# Patient Record
Sex: Female | Born: 1965
Health system: Southern US, Community
[De-identification: ages and names within clinical notes are randomized; demographics above are authoritative.]

## PROBLEM LIST (undated history)

## (undated) DIAGNOSIS — E039 Hypothyroidism, unspecified: Secondary | ICD-10-CM

## (undated) DIAGNOSIS — I83893 Varicose veins of bilateral lower extremities with other complications: Secondary | ICD-10-CM

## (undated) DIAGNOSIS — R011 Cardiac murmur, unspecified: Secondary | ICD-10-CM

## (undated) HISTORY — DX: Cardiac murmur, unspecified: R01.1

## (undated) HISTORY — PX: TUBAL LIGATION: SHX77

## (undated) HISTORY — DX: Varicose veins of bilateral lower extremities with other complications: I83.893

## (undated) HISTORY — DX: Hypothyroidism, unspecified: E03.9

---

## 1998-11-03 ENCOUNTER — Other Ambulatory Visit: Admission: RE | Admit: 1998-11-03 | Discharge: 1998-11-03 | Payer: Self-pay | Admitting: Obstetrics and Gynecology

## 1998-12-20 ENCOUNTER — Encounter: Payer: Self-pay | Admitting: Obstetrics and Gynecology

## 1998-12-20 ENCOUNTER — Ambulatory Visit (HOSPITAL_COMMUNITY): Admission: RE | Admit: 1998-12-20 | Discharge: 1998-12-20 | Payer: Self-pay | Admitting: Obstetrics and Gynecology

## 1999-01-10 ENCOUNTER — Inpatient Hospital Stay (HOSPITAL_COMMUNITY): Admission: AD | Admit: 1999-01-10 | Discharge: 1999-01-13 | Payer: Self-pay | Admitting: Obstetrics and Gynecology

## 1999-01-11 ENCOUNTER — Encounter: Payer: Self-pay | Admitting: Obstetrics and Gynecology

## 1999-02-18 ENCOUNTER — Inpatient Hospital Stay (HOSPITAL_COMMUNITY): Admission: AD | Admit: 1999-02-18 | Discharge: 1999-02-18 | Payer: Self-pay | Admitting: Obstetrics and Gynecology

## 1999-02-18 ENCOUNTER — Encounter: Payer: Self-pay | Admitting: Obstetrics and Gynecology

## 1999-04-26 ENCOUNTER — Inpatient Hospital Stay (HOSPITAL_COMMUNITY): Admission: AD | Admit: 1999-04-26 | Discharge: 1999-04-26 | Payer: Self-pay | Admitting: Obstetrics and Gynecology

## 1999-05-15 ENCOUNTER — Encounter (INDEPENDENT_AMBULATORY_CARE_PROVIDER_SITE_OTHER): Payer: Self-pay | Admitting: Specialist

## 1999-05-15 ENCOUNTER — Inpatient Hospital Stay (HOSPITAL_COMMUNITY): Admission: AD | Admit: 1999-05-15 | Discharge: 1999-05-17 | Payer: Self-pay | Admitting: Obstetrics and Gynecology

## 1999-09-24 ENCOUNTER — Emergency Department (HOSPITAL_COMMUNITY): Admission: EM | Admit: 1999-09-24 | Discharge: 1999-09-24 | Payer: Self-pay | Admitting: Emergency Medicine

## 2004-06-28 ENCOUNTER — Emergency Department (HOSPITAL_COMMUNITY): Admission: EM | Admit: 2004-06-28 | Discharge: 2004-06-28 | Payer: Self-pay | Admitting: Emergency Medicine

## 2005-07-16 ENCOUNTER — Other Ambulatory Visit: Admission: RE | Admit: 2005-07-16 | Discharge: 2005-07-16 | Payer: Self-pay | Admitting: Obstetrics and Gynecology

## 2010-01-17 ENCOUNTER — Emergency Department (HOSPITAL_COMMUNITY)
Admission: EM | Admit: 2010-01-17 | Discharge: 2010-01-17 | Payer: Self-pay | Source: Home / Self Care | Admitting: Emergency Medicine

## 2010-01-18 ENCOUNTER — Emergency Department (HOSPITAL_COMMUNITY)
Admission: EM | Admit: 2010-01-18 | Discharge: 2010-01-18 | Payer: Self-pay | Source: Home / Self Care | Admitting: Emergency Medicine

## 2010-04-07 ENCOUNTER — Emergency Department (HOSPITAL_COMMUNITY)
Admission: EM | Admit: 2010-04-07 | Discharge: 2010-04-07 | Disposition: A | Discharge status: Home / Self Care | Payer: Self-pay | Attending: Emergency Medicine | Admitting: Emergency Medicine

## 2010-04-07 DIAGNOSIS — J3489 Other specified disorders of nose and nasal sinuses: Secondary | ICD-10-CM | POA: Insufficient documentation

## 2010-04-07 DIAGNOSIS — R300 Dysuria: Secondary | ICD-10-CM | POA: Insufficient documentation

## 2010-04-07 DIAGNOSIS — J329 Chronic sinusitis, unspecified: Secondary | ICD-10-CM | POA: Insufficient documentation

## 2010-04-07 LAB — URINALYSIS, ROUTINE W REFLEX MICROSCOPIC
Bilirubin Urine: NEGATIVE
Hgb urine dipstick: NEGATIVE
Ketones, ur: NEGATIVE mg/dL
Nitrite: NEGATIVE
Protein, ur: NEGATIVE mg/dL
Specific Gravity, Urine: 1.019 (ref 1.005–1.030)
Urine Glucose, Fasting: NEGATIVE mg/dL
Urobilinogen, UA: 1 mg/dL (ref 0.0–1.0)
pH: 8 (ref 5.0–8.0)

## 2010-04-07 LAB — POCT PREGNANCY, URINE: Preg Test, Ur: NEGATIVE

## 2010-04-09 LAB — URINE CULTURE: Culture  Setup Time: 201202250140

## 2010-06-30 NOTE — Discharge Summary (Signed)
[facility]  Patient:    Madison Walker, Madison Walker                        MRN: [id number] Adm. Date:  [id number] Disc. Date: [id number] Attending:  Michaele Offer                           Discharge Summary  DISCHARGE DIAGNOSES:          1. Term pregnancy at 40+ weeks, delivered.                               2. Normal spontaneous vaginal delivery.                               3. Postpartum tubal ligation.  DISCHARGE MEDICATIONS:        1. Percocet 1-2 tablets p.o. every four to six hours                                  p.r.n.                               2. Motrin 600 mg p.o. every six hours.                               3. Prenatal vitamins 1 p.o. daily.                               4. Macrobid 1 tablet p.o. nightly for suppression of                                  urinary tract infections.  FOLLOW-UP:                    The patient is to follow up in our office in approximately six weeks for her routine postpartum exam.  HOSPITAL COURSE:              The patient is a 45 year old G2, P1-0-0-1, who was admitted at 40-5/7 weeks for induction given past her due date and a favorable cervix.  On admission she had good fetal movement, no leakage of fluid, no vaginal bleeding, and some occasional uterine contractions.  Her pregnancy had been complicated by pyelonephritis treated with IV antibiotics, and then the patient was placed on suppression with Macrobid given frequent UTIs.  The patient also with  advanced maternal age; however, declined an amniocentesis.  PRENATAL LABORATORIES:        O positive, antibody negative, RPR negative, rubella immune.  Hepatitis B surface antigen negative.  HIV negative.  GC negative, chlamydia negative, and GBS negative.  PAST OBSTETRICAL HISTORY:     In 1997, she had a normal spontaneous vaginal delivery, an 8-pound 13-ounce infant.  PAST GYNECOLOGIC HISTORY:     None.  PAST MEDICAL HISTORY:          History of multiple UTIs, as previously stated, with no urological workup at this date.  PAST SURGICAL HISTORY:  None.  ALLERGIES:                    No known drug allergies.  MEDICATIONS:                  Macrodantin and prenatal vitamins.  HOSPITAL COURSE:              On admission she was afebrile, with stable vital signs.  Her cervix was 1+, 50% effaced, and the head was slightly ballottable, ith a small part palpable above the head.  This was confirmed by sonogram to be a foot, but the infant was vertex, in a piked position.  Given the high station, assisted rupture of membranes was not performed at that time, and the patient was begun n high-dose Pitocin.  She did progress, with cervix becoming 2 cm dilated, and the vertex becoming well applied to the cervix.  At that point she had artificial rupture of membranes with clear fluid returned and at that point progressed rapidly to complete, with a normal spontaneous vaginal delivery of an 8-pound 3-ounce infant, Apgars of 9 and 9.  She had a small second-degree perineal laceration which was repaired.  Placenta was delivered spontaneously, and EBL was approximately 50. Following delivery, the patient was counseled regarding the risks and benefits f a tubal ligation including risk of failure of 1-2 in 300.  She also was counseled  regarding an increased risk of ectopic should pregnancy occur.  She accepted these risks, and desired to proceed with permanent sterilization.  Therefore, on postpartum day #1, she underwent a postpartum tubal ligation without complication. She was continued in-house, and on postpartum day #2 was doing well.  Her t-max was 99.4, and was normal on discharge.  She had normal lochia, and her pain was controlled.  Her incision was well approximated with no erythema noted and, therefore, she was discharged to home with medications and follow-up as previously stated. DD:  05/17/99 TD:   05/19/99 Job: [id number]

## 2010-06-30 NOTE — Op Note (Signed)
[facility]  Patient:    Madison Walker, Madison Walker                        MRN: [id number] Proc. Date: 05/16/99 Adm. Date:  [id number] Attending:  Michaele Offer                           Operative Report  PREOPERATIVE DIAGNOSIS:       Multiparity, desires sterility.  POSTOPERATIVE DIAGNOSIS:      Multiparity, desires sterility.  OPERATION:                    Postpartum bilateral tubal ligation with Pomeroy method.  SURGEON:                      Alvino Chapel, M.D.  ASSISTANT:  ANESTHESIA:                   Epidural.  ESTIMATED BLOOD LOSS:         50 cc.  IV FLUIDS:                    600 cc LR.  PATHOLOGY:                    Right and left fallopian tube segments were sent.  FINDINGS:                     The patient had normal ovaries and tubes bilaterally as well as normal uterus postpartum.  DESCRIPTION OF PROCEDURE:     The patient was taken to the operating room after  informed consent was obtained.  She was counseled as to the risks of failure of  tubal ligation of approximately 2 in 300 as well as increased risk of ectopic pregnancy should pregnancy occur.  She agreed to proceed with a permanent sterilization procedure.  In the operating room, epidural anesthesia was found o be adequate by Allis clamp test.  Intraumbilical incision was then made with additional 1% Marcaine and a local block.  This incision was approximately 2 cm in width.  This was carried down to the underlying layer of fascia with sharp dissection and the fascia was opened sharply.  The peritoneal cavity was entered and the Army-Nav[employer] retractor placed within the incision.  Some omentum and small  bowel were packed away with a laparotomy sponge which was left draped outside the abdomen.  The patients right fallopian tube was then identified, grasped with a  Babcock, and traced out to the fimbriated end.  A small loop of this tube was elevated out of the  incision and two free ties of 0 plain were passed around a cm knuckle of tube.  The tube was then amputated with Metzenbaum scissors and the pedicle inspected and found to be hemostatic.  The tube pedicle was then additionally cauterized with Bovie cautery, again reinspected and found to be hemostatic.  On the patients left in a similar fashion, the fallopian tube was identified, grasped with a Babcock, and traced out to its fimbriated end.  A 3 m segment of tube was elevated, tied off with two free ties of 0 plain, and amputated with Metzenbaum scissors.  Again the free pedicle was cauterized with Bovie cautery. This was found to be hemostatic and returned to the abdomen.  The laparotomy sponges were removed and the  fascia was then closed with 0 Vicryl in a running fashion.  The skin was closed with 3-0 Vicryl in a running fashion. Countx 2 and the patient was taken to the recovery room in stable condition. DD:  05/16/99 TD:  05/16/99 Job: [id number]

## 2010-07-19 ENCOUNTER — Inpatient Hospital Stay (HOSPITAL_COMMUNITY)
Admission: AD | Admit: 2010-07-19 | Discharge: 2010-07-19 | Disposition: A | Discharge status: Home / Self Care | Payer: Self-pay | Source: Ambulatory Visit | Attending: Obstetrics & Gynecology | Admitting: Obstetrics & Gynecology

## 2010-07-19 DIAGNOSIS — N949 Unspecified condition associated with female genital organs and menstrual cycle: Secondary | ICD-10-CM | POA: Insufficient documentation

## 2010-07-19 DIAGNOSIS — N938 Other specified abnormal uterine and vaginal bleeding: Secondary | ICD-10-CM | POA: Insufficient documentation

## 2010-07-19 LAB — CBC
MCH: 32.5 pg (ref 26.0–34.0)
MCV: 98.9 fL (ref 78.0–100.0)
Platelets: 225 10*3/uL (ref 150–400)
RBC: 3.69 MIL/uL — ABNORMAL LOW (ref 3.87–5.11)
RDW: 12.6 % (ref 11.5–15.5)
WBC: 6.7 10*3/uL (ref 4.0–10.5)

## 2010-07-19 LAB — URINALYSIS, ROUTINE W REFLEX MICROSCOPIC
Glucose, UA: NEGATIVE mg/dL
Hgb urine dipstick: NEGATIVE
Ketones, ur: NEGATIVE mg/dL
Protein, ur: NEGATIVE mg/dL
pH: 6 (ref 5.0–8.0)

## 2010-07-19 LAB — WET PREP, GENITAL
Trich, Wet Prep: NONE SEEN
Yeast Wet Prep HPF POC: NONE SEEN

## 2010-07-19 LAB — URINE MICROSCOPIC-ADD ON

## 2010-07-20 LAB — GC/CHLAMYDIA PROBE AMP, GENITAL
Chlamydia, DNA Probe: NEGATIVE
GC Probe Amp, Genital: NEGATIVE

## 2010-08-17 ENCOUNTER — Encounter: Payer: Self-pay | Admitting: Obstetrics and Gynecology

## 2013-09-26 ENCOUNTER — Emergency Department (HOSPITAL_COMMUNITY)
Admission: EM | Admit: 2013-09-26 | Discharge: 2013-09-26 | Disposition: A | Discharge status: Home / Self Care | Payer: Self-pay | Attending: Emergency Medicine | Admitting: Emergency Medicine

## 2013-09-26 ENCOUNTER — Emergency Department (HOSPITAL_COMMUNITY): Payer: Self-pay

## 2013-09-26 DIAGNOSIS — N390 Urinary tract infection, site not specified: Secondary | ICD-10-CM | POA: Insufficient documentation

## 2013-09-26 DIAGNOSIS — M545 Low back pain, unspecified: Secondary | ICD-10-CM | POA: Insufficient documentation

## 2013-09-26 DIAGNOSIS — Z79899 Other long term (current) drug therapy: Secondary | ICD-10-CM | POA: Insufficient documentation

## 2013-09-26 DIAGNOSIS — Z3202 Encounter for pregnancy test, result negative: Secondary | ICD-10-CM | POA: Insufficient documentation

## 2013-09-26 DIAGNOSIS — R110 Nausea: Secondary | ICD-10-CM | POA: Insufficient documentation

## 2013-09-26 DIAGNOSIS — R197 Diarrhea, unspecified: Secondary | ICD-10-CM | POA: Insufficient documentation

## 2013-09-26 LAB — COMPREHENSIVE METABOLIC PANEL
ALT: 14 U/L (ref 0–35)
ANION GAP: 12 (ref 5–15)
AST: 18 U/L (ref 0–37)
Albumin: 4.1 g/dL (ref 3.5–5.2)
Alkaline Phosphatase: 100 U/L (ref 39–117)
BILIRUBIN TOTAL: 0.5 mg/dL (ref 0.3–1.2)
BUN: 14 mg/dL (ref 6–23)
CHLORIDE: 104 meq/L (ref 96–112)
CO2: 25 meq/L (ref 19–32)
CREATININE: 0.68 mg/dL (ref 0.50–1.10)
Calcium: 9.8 mg/dL (ref 8.4–10.5)
GFR calc Af Amer: >90 mL/min (ref >90)
GLUCOSE: 88 mg/dL (ref 70–99)
Potassium: 4.5 mEq/L (ref 3.7–5.3)
Sodium: 141 mEq/L (ref 137–147)
Total Protein: 8.1 g/dL (ref 6.0–8.3)

## 2013-09-26 LAB — URINALYSIS, ROUTINE W REFLEX MICROSCOPIC
BILIRUBIN URINE: NEGATIVE
Glucose, UA: NEGATIVE mg/dL
Hgb urine dipstick: NEGATIVE
KETONES UR: NEGATIVE mg/dL
NITRITE: NEGATIVE
PH: 6 (ref 5.0–8.0)
Protein, ur: NEGATIVE mg/dL
Specific Gravity, Urine: 1.024 (ref 1.005–1.030)
UROBILINOGEN UA: 0.2 mg/dL (ref 0.0–1.0)

## 2013-09-26 LAB — URINE MICROSCOPIC-ADD ON

## 2013-09-26 LAB — CBC WITH DIFFERENTIAL/PLATELET
Basophils Absolute: 0 10*3/uL (ref 0.0–0.1)
Basophils Relative: 0 % (ref 0–1)
Eosinophils Absolute: 0.1 10*3/uL (ref 0.0–0.7)
Eosinophils Relative: 1 % (ref 0–5)
HEMATOCRIT: 37.8 % (ref 36.0–46.0)
HEMOGLOBIN: 12.7 g/dL (ref 12.0–15.0)
LYMPHS ABS: 1.1 10*3/uL (ref 0.7–4.0)
LYMPHS PCT: 18 % (ref 12–46)
MCH: 32.6 pg (ref 26.0–34.0)
MCHC: 33.6 g/dL (ref 30.0–36.0)
MCV: 96.9 fL (ref 78.0–100.0)
MONO ABS: 0.7 10*3/uL (ref 0.1–1.0)
MONOS PCT: 11 % (ref 3–12)
NEUTROS ABS: 4.2 10*3/uL (ref 1.7–7.7)
Neutrophils Relative %: 70 % (ref 43–77)
Platelets: 194 10*3/uL (ref 150–400)
RBC: 3.9 MIL/uL (ref 3.87–5.11)
RDW: 12.8 % (ref 11.5–15.5)
WBC: 5.9 10*3/uL (ref 4.0–10.5)

## 2013-09-26 LAB — POC URINE PREG, ED: Preg Test, Ur: NEGATIVE

## 2013-09-26 LAB — LIPASE, BLOOD: LIPASE: 31 U/L (ref 11–59)

## 2013-09-26 MED ORDER — OXYCODONE-ACETAMINOPHEN 5-325 MG PO TABS
1.0000 | ORAL_TABLET | Freq: Once | ORAL | Status: AC
  Administered 2013-09-26: 1 via ORAL
  Filled 2013-09-26: qty 1

## 2013-09-26 MED ORDER — DEXTROSE 5 % IV SOLN
1.0000 g | Freq: Once | INTRAVENOUS | Status: AC
  Administered 2013-09-26: 1 g via INTRAVENOUS
  Filled 2013-09-26: qty 10

## 2013-09-26 MED ORDER — SODIUM CHLORIDE 0.9 % IV BOLUS (SEPSIS)
1000.0000 mL | Freq: Once | INTRAVENOUS | Status: AC
  Administered 2013-09-26: 1000 mL via INTRAVENOUS

## 2013-09-26 MED ORDER — NITROFURANTOIN MONOHYD MACRO 100 MG PO CAPS: 100.0000 mg | ORAL_CAPSULE | Freq: Two times a day (BID) | ORAL | Status: DC

## 2013-09-26 NOTE — ED Notes (Signed)
Pt reports that she has had a bladder infection before and states pain in back in middle over spine.  She reports not incontinent, nor does she feel like she has trouble emptying her bladder fully at each urination.  Pt sts she dose have sence of urgency though.  sts subjective fevers and body aches as well.

## 2013-09-26 NOTE — ED Notes (Signed)
Patient transported to X-ray 

## 2013-09-26 NOTE — Discharge Instructions (Signed)
Please call and set-up an appointment with [facility] Please call and set-up an appointment with [facility] Please take antibiotics as prescribed Please drink plenty of water and stay hydrated  Please continue to monitor symptoms closely and if symptoms are to worsen or change (fever greater than 101, chills, sweating, nausea, vomiting, chest pain, shortness of breath, difficulty breathing, weakness, numbness, tingling, worsening or changes to pain pattern) please report back to the ED immediately  Urinary Tract Infection Urinary tract infections (UTIs) can develop anywhere along your urinary tract. Your urinary tract is your body's drainage system for removing wastes and extra water. Your urinary tract includes two kidneys, two ureters, a bladder, and a urethra. Your kidneys are a pair of bean-shaped organs. Each kidney is about the size of your fist. They are located below your ribs, one on each side of your spine. CAUSES Infections are caused by microbes, which are microscopic organisms, including fungi, viruses, and bacteria. These organisms are so small that they can only be seen through a microscope. Bacteria are the microbes that most commonly cause UTIs. SYMPTOMS  Symptoms of UTIs may vary by age and gender of the patient and by the location of the infection. Symptoms in young women typically include a frequent and intense urge to urinate and a painful, burning feeling in the bladder or urethra during urination. Older women and men are more likely to be tired, shaky, and weak and have muscle aches and abdominal pain. A fever may mean the infection is in your kidneys. Other symptoms of a kidney infection include pain in your back or sides below the ribs, nausea, and vomiting. DIAGNOSIS To diagnose a UTI, your caregiver will ask you about your symptoms. Your caregiver also will ask to provide a urine sample. The urine sample will be tested for bacteria and white  blood cells. White blood cells are made by your body to help fight infection. TREATMENT  Typically, UTIs can be treated with medication. Because most UTIs are caused by a bacterial infection, they usually can be treated with the use of antibiotics. The choice of antibiotic and length of treatment depend on your symptoms and the type of bacteria causing your infection. HOME CARE INSTRUCTIONS  If you were prescribed antibiotics, take them exactly as your caregiver instructs you. Finish the medication even if you feel better after you have only taken some of the medication.  Drink enough water and fluids to keep your urine clear or pale yellow.  Avoid caffeine, tea, and carbonated beverages. They tend to irritate your bladder.  Empty your bladder often. Avoid holding urine for long periods of time.  Empty your bladder before and after sexual intercourse.  After a bowel movement, women should cleanse from front to back. Use each tissue only once. SEEK MEDICAL CARE IF:   You have back pain.  You develop a fever.  Your symptoms do not begin to resolve within 3 days. SEEK IMMEDIATE MEDICAL CARE IF:   You have severe back pain or lower abdominal pain.  You develop chills.  You have nausea or vomiting.  You have continued burning or discomfort with urination. MAKE SURE YOU:   Understand these instructions.  Will watch your condition.  Will get help right away if you are not doing well or get worse. Document Released: 11/08/2004 Document Revised: 07/31/2011 Document Reviewed: 03/09/2011 [facility] Patient Information 2015 [place], [place]. This information is not intended to replace advice given to you by your health care  provider. Make sure you discuss any questions you have with your health care provider.   Emergency Department Resource Guide 1) Find a Doctor and Pay Out of Pocket Although you won't have to find out who is covered by your insurance plan, it is a good idea to ask  around and get recommendations. You will then need to call the office and see if the doctor you have chosen will accept you as a new patient and what types of options they offer for patients who are self-pay. Some doctors offer discounts or will set up payment plans for their patients who do not have insurance, but you will need to ask so you aren't surprised when you get to your appointment.  2) Contact Your Local Health Department Not all health departments have doctors that can see patients for sick visits, but many do, so it is worth a call to see if yours does. If you don't know where your local health department is, you can check in your phone book. The CDC also has a tool to help you locate your state's health department, and many state websites also have listings of all of their local health departments.  3) Find a [facility] If your illness is not likely to be very severe or complicated, you may want to try a walk in clinic. These are popping up all over the country in pharmacies, drugstores, and shopping centers. They're usually staffed by nurse practitioners or physician assistants that have been trained to treat common illnesses and complaints. They're usually fairly quick and inexpensive. However, if you have serious medical issues or chronic medical problems, these are probably not your best option.  No Primary Care Doctor: - Call Health Connect at  [phone] - they can help you locate a primary care doctor that  accepts your insurance, provides certain services, etc. - Physician Referral Service- [phone]  Chronic Pain Problems: Organization         Address  Phone   Notes  [facility]  [phone] Patients need to be referred by their primary care doctor.   Medication Assistance: Organization         Address  Phone   Notes  [facility] Medication [facility] [address] [place], [place] [address] [phone] --Must be a  resident of [facility] -- Must have NO insurance coverage whatsoever (no Medicaid/ Medicare, etc.) -- The pt. MUST have a primary care doctor that directs their care regularly and follows them in the community   MedAssist  [phone]   [employer]  [phone]    Agencies that provide inexpensive medical care: Organization         Address  Phone   Notes  [place]  [phone]   [facility]    [phone]   [facility] [place], [place] [address] [phone]   [place] [address], [place] [phone]   Planned Parenthood    [phone]   [facility]    [phone]   [place] and [place] [address], [place] Phone:  [phone], Fax:  [phone] Hours of Operation:  9 am - 6 pm, M-F.  Also accepts Medicaid/Medicare and self-pay.  [facility] for [place], [address], [place] Phone: [phone], Fax: [phone]. Hours of Operation:  8:30 am - 5:30 pm, M-F.  Also accepts Medicaid and self-pay.  [facility] [place] [address] [place] Phone: [phone]   [place], [place], [place] [phone], Ext. [phone] Mondays & Thursdays: 7-9 AM.  First 15 patients are seen on a first come, first serve basis.    [place] Providers:  Organization         Address  Phone   Notes  [facility] [address], [place] [phone] Also accepts self-pay patients.  [facility] [address], [place]  [phone]   [place], [address], [place] [phone]   [facility] [address], [place] [phone]   Lucianne Lei [address], [place]   [phone] Only accepts  [place] Access [place] patients after they have their name applied to their card.   Self-Pay (no insurance) in [facility]:  Organization         Address  Phone   Notes  Sickle Cell Patients, [facility] Internal Medicine [place] [phone]   [facility] Urgent Care [place] [phone]   [facility] [place]  [place], [place], [place] [phone]   [facility]/Dr. Osei-Bonsu  [address] [place] or [address], [place] [phone] Phone number for both [place] and [place] locations is the same.  [facility]gent [facility] and [facility] [address] [place] [phone]   [facility] [address] [place] or [address] [phone]   [facility] [address], [place] [phone], phone; [phone], fax Sees patients 1st and 3rd Saturday of every month.  Must not qualify for public or private insurance (i.e. Medicaid, Medicare, [place] Health Choice, Veterans' Benefits)  Household income should be no more than 200% of the poverty level The clinic cannot treat you if you are pregnant or think you are pregnant  Sexually transmitted diseases are not treated at the clinic.    Dental Care: Organization         Address  Phone  Notes  [facility] Department of [facility] [place] [phone] Accepts children up to age 45 who are enrolled in [place] or [place]; pregnant women with a Medicaid card; and children who have applied for Medicaid or [place] Health Choice, but were declined, whose parents can pay a reduced fee at time of service.  [facility] Department of [facility]  [address], [place] [phone] Accepts children up to age 77 who are enrolled in [place] or [place]; pregnant women  with a Medicaid card; and children who have applied for Medicaid or [place] Health Choice, but were declined, whose parents can pay a reduced fee at time of service.  [place] Adult Dental Access PROGRAM  [place] [phone] Patients are seen by appointment only. Walk-ins are not accepted. [place] will see patients 87 years of age and older. Monday - Tuesday (8am-5pm) Most Wednesdays (8:30-5pm) $30 per visit, cash only  [facility] Adult Dental Access PROGRAM  [address], [facility] [phone] Patients are seen by appointment  only. Walk-ins are not accepted. [place] will see patients 30 years of age and older. One Wednesday Evening (Monthly: Volunteer Based).  $30 per visit, cash only  [place]  [phone] for adults; Children under age 47, call Graduate Pediatric Dentistry at [phone]. Children aged 51-14, please call [phone] to request a pediatric application.  Dental services are provided in all areas of dental care including fillings, crowns and bridges, complete and partial dentures, implants, gum treatment, root canals, and extractions. Preventive care is also provided. Treatment is provided to both adults and children. Patients are selected via a lottery and there is often a waiting list.   [facility] [address] [place]  [phone] [email or web address]   [facility] [address] [place], [place] [phone], Ext. [phone] Second and Fourth Thursday of each month, opens at 6:30 AM; Clinic ends at 9 AM.  Patients are seen on a first-come first-served basis, and a limited number are seen during each clinic.   [facility]  [address] Hillard Danker [place], [place] [phone]   Eligibility Requirements You must have lived in [place], [place], or [place] counties for at least the last three months.   You cannot be eligible for state or federal sponsored [employer], including [employer], [place], or [employer].   You generally cannot be eligible for healthcare insurance through your employer.    How to apply: Eligibility screenings are held every Tuesday and Wednesday afternoon from 1:00 pm until 4:00 pm. You do not need an appointment for the interview!  [facility] [address] [place], [place]   [place]  [place] Department  [place]  [phone]    Behavioral Health Resources in the Community: Intensive Outpatient Programs Organization         Address  Phone  Notes  [place]. [address], [place], [place] [phone]   [facility] [address] [place], [place]   ADS: Alcohol & Drug Svcs [address] [place], [place]   [place] [address]  [place], [place] or [phone]   Substance Abuse Resources Organization         Address  Phone  Notes  Alcohol and Drug Services  [phone]   [place]  [phone]   The [place]   Chinita Pester  [phone]   Residential & Outpatient Substance Abuse Program  [phone]   Psychological Services Organization         Address  Phone  Notes  [facility]  [place]  [phone]   [place] [address], [place] or [phone]    Mobile Crisis Teams Organization         Address  Phone  Notes  [facility], Mobile Crisis Care Unit  [phone]   [facility]ive Psychotherapeutic Services  [address] [place], [place]   [place] Levels [address] [place] [phone]    Self-Help/Support Groups Organization         Address  Phone             Notes  [place]. of [place] - variety of support groups  [place] Call for more information  Narcotics Anonymous (NA), [facility] [address], [employer] [place]  2 meetings at this location   Special educational needs teacher         Address  Phone  Notes  ASAP Residential Treatment [place],    [place]  [phone]   [facility]  [address], [place] [id number], [place], [place]   [place], [place] [phone] Admissions: 8am-3pm M-F  Incentives Substance [place] [address],    [place], [place] [phone]   [facility] [address] [place], [place], [place]   [facility] [address],  [place], [place]   [facility]ight Programs - Intensive Outpatient [address], [place], [place]   [facility] [place].,  [place], [place] [phone] or [phone]   [facility]idential Treatment Services (RTS) [address], [place], [place] Accepts Medicaid  [facility] [address]  [place] [phone] Substance Abuse/Addiction Treatment   [facility] Organization         Address  Phone  Notes  [facility]  [phone]   Domenic Schwab, PhD [address] Arlis Porta [place], [place]   [phone] or [phone]   [place], [place] [phone]   [facility] [address] [place], [place] [phone] Insurance/Medicaid/sponsorship through [facility] and Families [address], [address] [place]                                    [place], [place] [phone] [place] [address] [place] [address], [place] [phone]    Dr. Adele Schilder  [phone]   [facility] Dept. 1) [address]  [place] 2) [place] 3)  [place] [address], [place] [phone]  [phone]   [facility] [place] [phone] or [phone] (After Hours)

## 2013-09-26 NOTE — ED Provider Notes (Signed)
CSN: [id number]     Arrival date & time 09/26/13  1224 History   First MD Initiated Contact with Patient 09/26/13 1244     Chief Complaint  Patient presents with  . Nausea     (Consider location/radiation/quality/duration/timing/severity/associated sxs/prior Treatment) The history is provided by the patient. No language interpreter was used.  Madison Walker is a 48 y/o F with no known significant PMHx presenting to the ED with increased urine frequency, lower back pain, and generalized bodyaches that have been ongoing for the past week and a half. Patient reported that the back pain is localized to lower portion described as an aching sensation without radiation. Stated that she has intermittent generalized bodyaches off and on better controlled with Tylenol. Stated that she's been experiencing mild suprapubic discomfort-more a pressure sensation that improves with urination. Reported that she had a low-grade fever this morning approximately 99.18F. Stated that she's been feeling mildly nauseous with no episodes of emesis-report that when she eats she has intermittent episodes of diarrhea - loose brown stools with no blood or mucus identified. Stated that she's been passing gas without difficulty. Reported that her last menstrual cycle was a couple weeks ago. Patient reported that when she gets UTIs she normally presents in this manner. Denied abdominal pain, dysuria, hematuria, vomiting, headache, dizziness, blurred vision, sudden loss of vision, neck pain, neck stiffness, melena, hematochezia vaginal pain, vaginal discharge. PCP none  No past medical history on file. No past surgical history on file. No family history on file. History  Substance Use Topics  . Smoking status: Not on file  . Smokeless tobacco: Not on file  . Alcohol Use: Not on file   OB History   No data available     Review of Systems  Constitutional: Positive for fever and chills.  Respiratory: Negative for cough,  chest tightness and shortness of breath.   Cardiovascular: Negative for chest pain.  Gastrointestinal: Positive for nausea and diarrhea. Negative for vomiting, abdominal pain, constipation, blood in stool and anal bleeding.  Genitourinary: Positive for frequency. Negative for dysuria, urgency, hematuria, flank pain, decreased urine volume, vaginal bleeding, vaginal discharge, vaginal pain and pelvic pain.  Musculoskeletal: Positive for back pain and myalgias (Generalized). Negative for neck pain and neck stiffness.  Neurological: Negative for dizziness, weakness and headaches.      Allergies  Review of patient's allergies indicates no known allergies.  Home Medications   Prior to Admission medications   Medication Sig Start Date End Date Taking? Authorizing Provider  acetaminophen (TYLENOL) 500 MG tablet Take 1,500 mg by mouth every 6 (six) hours as needed for mild pain or moderate pain.   Yes Historical Provider, MD  nitrofurantoin, macrocrystal-monohydrate, (MACROBID) 100 MG capsule Take 1 capsule (100 mg total) by mouth 2 (two) times daily. 09/26/13   Neftaly Swiss, PA-C   BP 123/71  Pulse 62  Temp(Src) 99 F (37.2 C) (Oral)  Resp 17  SpO2 100%  LMP 09/12/2013 Physical Exam  Nursing note and vitals reviewed. Constitutional: She is oriented to person, place, and time. She appears well-developed and well-nourished. No distress.  Patient sitting comfortable sitting upright in bed with negative signs of distress  HENT:  Head: Normocephalic and atraumatic.  Mouth/Throat: Oropharynx is clear and moist. No oropharyngeal exudate.  Eyes: Conjunctivae and EOM are normal. Pupils are equal, round, and reactive to light. Right eye exhibits no discharge. Left eye exhibits no discharge.  Neck: Normal range of motion. Neck supple. No tracheal deviation present.  Cardiovascular: Normal rate, regular rhythm and normal heart sounds.  Exam reveals no friction rub.   No murmur heard. Cap refill  less than 3 seconds Negative swelling or pitting edema identified to the lower extremities bilaterally  Pulmonary/Chest: Effort normal and breath sounds normal. No respiratory distress. She has no wheezes. She has no rales.  Patient is able to speak in full sentences without difficulty Negative use of accessory muscles Negative stridor  Abdominal: Soft. Bowel sounds are normal. She exhibits no distension. There is tenderness. There is no rebound and no guarding.  Negative abdominal distention Bowel sounds normal active in all 4 quadrants Abdomen soft upon palpation Mild discomfort upon palpation to the suprapubic region-as per patient described as a pressure Negative peritoneal signs Negative guarding or rigidity Negative CVA tenderness bilaterally  Musculoskeletal: Normal range of motion.  Full ROM to upper and lower extremities without difficulty noted, negative ataxia noted.  Lymphadenopathy:    She has no cervical adenopathy.  Neurological: She is alert and oriented to person, place, and time. No cranial nerve deficit. She exhibits normal muscle tone. Coordination normal.  Cranial nerves III-XII grossly intact Strength 5+/5+ to upper and lower extremities bilaterally with resistance applied, equal distribution noted Equal grip strength bilaterally Negative facial droop Negative slurred speech Negative aphasia  Skin: Skin is warm and dry. No rash noted. She is not diaphoretic. No erythema.  Psychiatric: She has a normal mood and affect. Her behavior is normal. Thought content normal.    ED Course  Procedures (including critical care time)  Results for orders placed during the hospital encounter of 09/26/13  CBC WITH DIFFERENTIAL      Result Value Ref Range   WBC 5.9  4.0 - 10.5 K/uL   RBC 3.90  3.87 - 5.11 MIL/uL   Hemoglobin 12.7  12.0 - 15.0 g/dL   HCT 37.8  36.0 - 46.0 %   MCV 96.9  78.0 - 100.0 fL   MCH 32.6  26.0 - 34.0 pg   MCHC 33.6  30.0 - 36.0 g/dL   RDW 12.8   11.5 - 15.5 %   Platelets 194  150 - 400 K/uL   Neutrophils Relative % 70  43 - 77 %   Neutro Abs 4.2  1.7 - 7.7 K/uL   Lymphocytes Relative 18  12 - 46 %   Lymphs Abs 1.1  0.7 - 4.0 K/uL   Monocytes Relative 11  3 - 12 %   Monocytes Absolute 0.7  0.1 - 1.0 K/uL   Eosinophils Relative 1  0 - 5 %   Eosinophils Absolute 0.1  0.0 - 0.7 K/uL   Basophils Relative 0  0 - 1 %   Basophils Absolute 0.0  0.0 - 0.1 K/uL  COMPREHENSIVE METABOLIC PANEL      Result Value Ref Range   Sodium 141  137 - 147 mEq/L   Potassium 4.5  3.7 - 5.3 mEq/L   Chloride 104  96 - 112 mEq/L   CO2 25  19 - 32 mEq/L   Glucose, Bld 88  70 - 99 mg/dL   BUN 14  6 - 23 mg/dL   Creatinine, Ser 0.68  0.50 - 1.10 mg/dL   Calcium 9.8  8.4 - 10.5 mg/dL   Total Protein 8.1  6.0 - 8.3 g/dL   Albumin 4.1  3.5 - 5.2 g/dL   AST 18  0 - 37 U/L   ALT 14  0 - 35 U/L   Alkaline Phosphatase  100  39 - 117 U/L   Total Bilirubin 0.5  0.3 - 1.2 mg/dL   GFR calc non Af Amer >90  >90 mL/min   GFR calc Af Amer >90  >90 mL/min   Anion gap 12  5 - 15  LIPASE, BLOOD      Result Value Ref Range   Lipase 31  11 - 59 U/L  URINALYSIS, ROUTINE W REFLEX MICROSCOPIC      Result Value Ref Range   Color, Urine YELLOW  YELLOW   APPearance CLOUDY (*) CLEAR   Specific Gravity, Urine 1.024  1.005 - 1.030   pH 6.0  5.0 - 8.0   Glucose, UA NEGATIVE  NEGATIVE mg/dL   Hgb urine dipstick NEGATIVE  NEGATIVE   Bilirubin Urine NEGATIVE  NEGATIVE   Ketones, ur NEGATIVE  NEGATIVE mg/dL   Protein, ur NEGATIVE  NEGATIVE mg/dL   Urobilinogen, UA 0.2  0.0 - 1.0 mg/dL   Nitrite NEGATIVE  NEGATIVE   Leukocytes, UA MODERATE (*) NEGATIVE  URINE MICROSCOPIC-ADD ON      Result Value Ref Range   Squamous Epithelial / LPF MANY (*) RARE   WBC, UA TOO NUMEROUS TO COUNT  <3 WBC/hpf   RBC / HPF 0-2  <3 RBC/hpf   Bacteria, UA FEW (*) RARE  POC URINE PREG, ED      Result Value Ref Range   Preg Test, Ur NEGATIVE  NEGATIVE    Labs Review Labs Reviewed   URINALYSIS, ROUTINE W REFLEX MICROSCOPIC - Abnormal; Notable for the following:    APPearance CLOUDY (*)    Leukocytes, UA MODERATE (*)    All other components within normal limits  URINE MICROSCOPIC-ADD ON - Abnormal; Notable for the following:    Squamous Epithelial / LPF MANY (*)    Bacteria, UA FEW (*)    All other components within normal limits  URINE CULTURE  CBC WITH DIFFERENTIAL  COMPREHENSIVE METABOLIC PANEL  LIPASE, BLOOD  POC URINE PREG, ED    Imaging Review Dg Lumbar Spine Complete  09/26/2013   CLINICAL DATA:  Low back pain and soreness for 1-1/2 weeks.  EXAM: LUMBAR SPINE - COMPLETE 4+ VIEW  COMPARISON:  Plain films lumbar spine 01/18/2010.  FINDINGS: Vertebral body height and alignment are maintained. Mild loss of disc space height at L4-5 and L5-S1 is noted. There is also facet degenerative change at these levels.  IMPRESSION: No acute finding.  Mild appearing lower lumbar spondylosis, unchanged.   Electronically Signed   By: Inge Rise M.D.   On: 09/26/2013 15:49     EKG Interpretation None      MDM   Final diagnoses:  UTI (lower urinary tract infection)  Low back pain without sciatica, unspecified back pain laterality    Medications  sodium chloride 0.9 % bolus 1,000 mL (0 mLs Intravenous Stopped 09/26/13 1420)  cefTRIAXone (ROCEPHIN) 1 g in dextrose 5 % 50 mL IVPB (0 g Intravenous Stopped 09/26/13 1454)  oxyCODONE-acetaminophen (PERCOCET/ROXICET) 5-325 MG per tablet 1 tablet (1 tablet Oral Given 09/26/13 1548)   Filed Vitals:   09/26/13 1233 09/26/13 1256 09/26/13 1544 09/26/13 1616  BP: 131/74 123/77 123/69 123/71  Pulse: 81 75 78 62  Temp: 98.9 F (37.2 C) 99 F (37.2 C)    TempSrc: Oral Oral    Resp: 16 12 17 17   SpO2: 97% 97%  100%   CBC negative elevated white blood cell count. CMP unremarkable. Lipase negative elevation. Urine pregnancy negative. Urinalysis noted moderate  leukocytes with white blood cells too many to count with many  squamous cells and bacteria. Urine culture pending. Plain film of lumbar spine unremarkable. Suspicion to to be UTI. Cannot rule out possible pyelonephritis. Patient given IV antibiotics. Patient stable, afebrile. Patient not septic appearing. Discharged patient. Discharged patient with antibiotics. Discussed with patient to rest and stay hydrated. Referred to [facility] [place] [facility]. Discussed with patient to closely monitor symptoms and if symptoms are to worsen or change to report back to the ED - strict return instructions given.  Patient agreed to plan of care, understood, all questions answered.   Jamse Mead, PA-C 09/26/13 1640

## 2013-09-26 NOTE — ED Notes (Signed)
She c/o generalized body aches with some low back pain and urinary frequency x 1 1/2 weeks with con't. Nausea.  She denies vomiting and states she has had a few diarrhea stools.

## 2013-09-27 NOTE — ED Provider Notes (Addendum)
Medical screening examination/treatment/procedure(s) were performed by non-physician practitioner and as supervising physician I was immediately available for consultation/collaboration.   EKG Interpretation None         Delice Bison Randle Shatzer, DO 09/27/13 [place], DO 10/07/13 1943

## 2013-09-28 LAB — URINE CULTURE
COLONY COUNT: NO GROWTH
Culture: NO GROWTH
SPECIAL REQUESTS: NORMAL

## 2014-11-11 ENCOUNTER — Emergency Department (HOSPITAL_COMMUNITY)
Admission: EM | Admit: 2014-11-11 | Discharge: 2014-11-11 | Disposition: A | Discharge status: Home / Self Care | Payer: Self-pay | Attending: Emergency Medicine | Admitting: Emergency Medicine

## 2014-11-11 ENCOUNTER — Encounter (HOSPITAL_COMMUNITY): Payer: Self-pay

## 2014-11-11 DIAGNOSIS — N390 Urinary tract infection, site not specified: Secondary | ICD-10-CM | POA: Insufficient documentation

## 2014-11-11 LAB — URINALYSIS, ROUTINE W REFLEX MICROSCOPIC
Bilirubin Urine: NEGATIVE
GLUCOSE, UA: NEGATIVE mg/dL
Hgb urine dipstick: NEGATIVE
Ketones, ur: NEGATIVE mg/dL
NITRITE: NEGATIVE
PH: 5.5 (ref 5.0–8.0)
Protein, ur: NEGATIVE mg/dL
SPECIFIC GRAVITY, URINE: 1.018 (ref 1.005–1.030)
Urobilinogen, UA: 1 mg/dL (ref 0.0–1.0)

## 2014-11-11 LAB — URINE MICROSCOPIC-ADD ON

## 2014-11-11 MED ORDER — CEPHALEXIN 500 MG PO CAPS: 500.0000 mg | ORAL_CAPSULE | Freq: Four times a day (QID) | ORAL | Status: DC

## 2014-11-11 MED ORDER — NITROFURANTOIN MONOHYD MACRO 100 MG PO CAPS: 100.0000 mg | ORAL_CAPSULE | Freq: Two times a day (BID) | ORAL | Status: DC

## 2014-11-11 NOTE — ED Notes (Addendum)
Pt c/o lower back pain, dysuria, and nausea x 6 days and lower abdominal cramping x 2 days.  Pain score 5/10.  Pt reports taking acetaminophen w/o relief.

## 2014-11-11 NOTE — Discharge Instructions (Signed)

## 2014-11-11 NOTE — ED Notes (Signed)
md at bedside  Pt alert and oriented x4. Respirations even and unlabored, bilateral symmetrical rise and fall of chest. Skin warm and dry. In no acute distress. Denies needs.   

## 2014-11-11 NOTE — ED Provider Notes (Signed)
CSN: [id number]     Arrival date & time 11/11/14  0705 History   First MD Initiated Contact with Patient 11/11/14 [phone]     Chief Complaint  Patient presents with  . Back Pain  . Dysuria  . Nausea     (Consider location/radiation/quality/duration/timing/severity/associated sxs/prior Treatment) HPI  Madison Walker is a 49 y.o. female who presents for evaluation of dysuria, nausea, lower back pain and lower abdomen cramping for several days. No hematuria, anorexia, nausea, vomiting, weakness or dizziness. She feels that she has previously when she had a UTI. She works as a Designer, fashion/clothing. There are no other modifying factors.    History reviewed. No pertinent past medical history. Past Surgical History  Procedure Laterality Date  . Tubal ligation    . Tubal ligation     History reviewed. No pertinent family history. Social History  Substance Use Topics  . Smoking status: Never Smoker   . Smokeless tobacco: None  . Alcohol Use: No   OB History    No data available     Review of Systems  All other systems reviewed and are negative.     Allergies  Review of patient's allergies indicates no known allergies.  Home Medications   Prior to Admission medications   Medication Sig Start Date End Date Taking? Authorizing Provider  acetaminophen (ARTHRITIS PAIN) 650 MG CR tablet Take 1,300 mg by mouth every 8 (eight) hours as needed for pain.   Yes Historical Provider, MD  acetaminophen (TYLENOL) 500 MG tablet Take 1,500 mg by mouth every 6 (six) hours as needed for mild pain or moderate pain.   Yes Historical Provider, MD  cephALEXin (KEFLEX) 500 MG capsule Take 1 capsule (500 mg total) by mouth 4 (four) times daily. 11/11/14   Daleen Bo, MD   BP 127/73 mmHg  Pulse 62  Temp(Src) 98.3 F (36.8 C) (Oral)  Resp 18  SpO2 100% Physical Exam  Constitutional: She is oriented to person, place, and time. She appears well-developed and well-nourished. No distress.  HENT:  Head:  Normocephalic and atraumatic.  Right Ear: External ear normal.  Left Ear: External ear normal.  Eyes: Conjunctivae and EOM are normal. Pupils are equal, round, and reactive to light.  Neck: Normal range of motion and phonation normal. Neck supple.  Cardiovascular: Normal rate, regular rhythm and normal heart sounds.   Pulmonary/Chest: Effort normal and breath sounds normal. She exhibits no bony tenderness.  Abdominal: Soft. There is no tenderness.  Genitourinary:  No costovertebral angle tenderness with percussion  Musculoskeletal: Normal range of motion.  Neurological: She is alert and oriented to person, place, and time. No cranial nerve deficit or sensory deficit. She exhibits normal muscle tone. Coordination normal.  Skin: Skin is warm, dry and intact.  Psychiatric: She has a normal mood and affect. Her behavior is normal. Judgment and thought content normal.  Nursing note and vitals reviewed.   ED Course  Procedures (including critical care time)  07:26- . As I entered the room, she was coming back from the bathroom, having just voided and had a bowel movement. She will attempt to eat and drink, to produce a sample.  Labs Review Labs Reviewed  URINALYSIS, ROUTINE W REFLEX MICROSCOPIC (NOT AT [facility]) - Abnormal; Notable for the following:    APPearance CLOUDY (*)    Leukocytes, UA MODERATE (*)    All other components within normal limits  URINE MICROSCOPIC-ADD ON - Abnormal; Notable for the following:    Squamous Epithelial /  LPF FEW (*)    Bacteria, UA FEW (*)    All other components within normal limits    Imaging Review No results found. I have personally reviewed and evaluated these images and lab results as part of my medical decision-making.   EKG Interpretation None      MDM   Final diagnoses:  Urinary tract infection without hematuria, site unspecified    UTI without suggestion for pyelonephritis, metabolic instability or suggestion for impending vascular  collapse.  Nursing Notes Reviewed/ Care Coordinated Applicable Imaging Reviewed Interpretation of Laboratory Data incorporated into ED treatment  The patient appears reasonably screened and/or stabilized for discharge and I doubt any other medical condition or other [facility] requiring further screening, evaluation, or treatment in the ED at this time prior to discharge.  Plan: Home Medications- keflex; Home Treatments- rest; return here if the recommended treatment, does not improve the symptoms; Recommended follow up- PCP 1 week     Daleen Bo, MD 11/11/14 1616

## 2014-11-11 NOTE — ED Notes (Signed)
Pt escorted to discharge window. Pt verbalized understanding discharge instructions. In no acute distress.  

## 2014-11-11 NOTE — ED Notes (Signed)
Pt went to the restroom prior to talking with rn or md, pt drinking coffee and will attempt to urinate again in a few minutes.

## 2015-03-23 ENCOUNTER — Emergency Department (HOSPITAL_COMMUNITY)
Admission: EM | Admit: 2015-03-23 | Discharge: 2015-03-23 | Disposition: A | Discharge status: Home / Self Care | Payer: Self-pay | Attending: Emergency Medicine | Admitting: Emergency Medicine

## 2015-03-23 ENCOUNTER — Emergency Department (HOSPITAL_COMMUNITY): Payer: Self-pay

## 2015-03-23 ENCOUNTER — Encounter (HOSPITAL_COMMUNITY): Payer: Self-pay

## 2015-03-23 DIAGNOSIS — R935 Abnormal findings on diagnostic imaging of other abdominal regions, including retroperitoneum: Secondary | ICD-10-CM

## 2015-03-23 DIAGNOSIS — R1909 Other intra-abdominal and pelvic swelling, mass and lump: Secondary | ICD-10-CM | POA: Insufficient documentation

## 2015-03-23 DIAGNOSIS — Z3202 Encounter for pregnancy test, result negative: Secondary | ICD-10-CM | POA: Insufficient documentation

## 2015-03-23 DIAGNOSIS — K5900 Constipation, unspecified: Secondary | ICD-10-CM | POA: Insufficient documentation

## 2015-03-23 DIAGNOSIS — R1900 Intra-abdominal and pelvic swelling, mass and lump, unspecified site: Secondary | ICD-10-CM

## 2015-03-23 LAB — COMPREHENSIVE METABOLIC PANEL
ALT: 16 U/L (ref 14–54)
ANION GAP: 8 (ref 5–15)
AST: 16 U/L (ref 15–41)
Albumin: 4.2 g/dL (ref 3.5–5.0)
Alkaline Phosphatase: 76 U/L (ref 38–126)
BUN: 18 mg/dL (ref 6–20)
CHLORIDE: 108 mmol/L (ref 101–111)
CO2: 24 mmol/L (ref 22–32)
CREATININE: 0.58 mg/dL (ref 0.44–1.00)
Calcium: 9.4 mg/dL (ref 8.9–10.3)
Glucose, Bld: 106 mg/dL — ABNORMAL HIGH (ref 65–99)
POTASSIUM: 3.4 mmol/L — AB (ref 3.5–5.1)
Sodium: 140 mmol/L (ref 135–145)
Total Bilirubin: 0.7 mg/dL (ref 0.3–1.2)
Total Protein: 7.5 g/dL (ref 6.5–8.1)

## 2015-03-23 LAB — URINALYSIS, ROUTINE W REFLEX MICROSCOPIC
Bilirubin Urine: NEGATIVE
GLUCOSE, UA: NEGATIVE mg/dL
Hgb urine dipstick: NEGATIVE
KETONES UR: 15 mg/dL — AB
LEUKOCYTES UA: NEGATIVE
NITRITE: NEGATIVE
PH: 6 (ref 5.0–8.0)
PROTEIN: NEGATIVE mg/dL
Specific Gravity, Urine: 1.03 (ref 1.005–1.030)

## 2015-03-23 LAB — CBC
HEMATOCRIT: 33.2 % — AB (ref 36.0–46.0)
Hemoglobin: 10.9 g/dL — ABNORMAL LOW (ref 12.0–15.0)
MCH: 32.8 pg (ref 26.0–34.0)
MCHC: 32.8 g/dL (ref 30.0–36.0)
MCV: 100 fL (ref 78.0–100.0)
Platelets: 240 10*3/uL (ref 150–400)
RBC: 3.32 MIL/uL — AB (ref 3.87–5.11)
RDW: 12.9 % (ref 11.5–15.5)
WBC: 10.7 10*3/uL — AB (ref 4.0–10.5)

## 2015-03-23 LAB — I-STAT BETA HCG BLOOD, ED (MC, WL, AP ONLY): I-stat hCG, quantitative: <5 m[IU]/mL (ref <5)

## 2015-03-23 LAB — LIPASE, BLOOD: LIPASE: 23 U/L (ref 11–51)

## 2015-03-23 MED ORDER — IOHEXOL 300 MG/ML  SOLN
25.0000 mL | Freq: Once | INTRAMUSCULAR | Status: AC | PRN
  Administered 2015-03-23: 25 mL via ORAL

## 2015-03-23 MED ORDER — IOHEXOL 300 MG/ML  SOLN
100.0000 mL | Freq: Once | INTRAMUSCULAR | Status: AC | PRN
  Administered 2015-03-23: 100 mL via INTRAVENOUS

## 2015-03-23 MED ORDER — HYDROCODONE-ACETAMINOPHEN 5-325 MG PO TABS: 1.0000 | ORAL_TABLET | Freq: Four times a day (QID) | ORAL | Status: DC | PRN

## 2015-03-23 MED ORDER — ONDANSETRON 4 MG PO TBDP: ORAL_TABLET | ORAL | Status: DC

## 2015-03-23 NOTE — Progress Notes (Signed)
Patient listed as not having a pcp or insurance living in [place].  [facility] provided patient with list of free medical clinics in [facility], and contact information to [place] [facility] [employer].    [facility] provided patient with contact infromation to [facility], informed patient of services there and walk in times.  EDCM also provided patient with list of pcps who accept self pay patients, list of discount pharmacies and websites needymeds.org and GoodRX.com for medication assistance, phone number to inquire about the orange card, phone number to inquire about Medicaid, phone number to inquire about the [place], financial resources in the community such as local churches, [employer], urban ministries, and dental assistance for uninsured patients.  Patient thankful for resources.  No further EDCM needs at this time.

## 2015-03-23 NOTE — ED Notes (Signed)
Pt in CT.

## 2015-03-23 NOTE — ED Notes (Signed)
Ultrasound at bedside

## 2015-03-23 NOTE — ED Notes (Signed)
Pt c/o lower abdominal pain, nausea, and constipation x 5 days.  Pain score 8/10.  Denies emesis.  Pt reports taking a suppository w/ "very small" BM.

## 2015-03-23 NOTE — ED Provider Notes (Signed)
CSN: [id number]     Arrival date & time 03/23/15  1743 History   First MD Initiated Contact with Patient 03/23/15 1940     Chief Complaint  Patient presents with  . Abdominal Pain  . Nausea  . Constipation     (Consider location/radiation/quality/duration/timing/severity/associated sxs/prior Treatment) Patient is a 50 y.o. female presenting with abdominal pain and constipation. The history is provided by the patient (Patient complains of some left lower quadrant abdominal pain. Like she's constipated. The pain is mild).  Abdominal Pain Pain location: Left lower quadrant. Pain quality: aching   Pain radiates to:  Does not radiate Pain severity:  Mild Onset quality:  Gradual Timing:  Constant Progression:  Waxing and waning Chronicity:  New Context: not alcohol use   Associated symptoms: constipation   Associated symptoms: no chest pain, no cough, no diarrhea, no fatigue and no hematuria   Constipation Associated symptoms: abdominal pain   Associated symptoms: no back pain and no diarrhea     History reviewed. No pertinent past medical history. Past Surgical History  Procedure Laterality Date  . Tubal ligation    . Tubal ligation     History reviewed. No pertinent family history. Social History  Substance Use Topics  . Smoking status: Never Smoker   . Smokeless tobacco: None  . Alcohol Use: No   OB History    No data available     Review of Systems  Constitutional: Negative for appetite change and fatigue.  HENT: Negative for congestion, ear discharge and sinus pressure.   Eyes: Negative for discharge.  Respiratory: Negative for cough.   Cardiovascular: Negative for chest pain.  Gastrointestinal: Positive for abdominal pain and constipation. Negative for diarrhea.  Genitourinary: Negative for frequency and hematuria.  Musculoskeletal: Negative for back pain.  Skin: Negative for rash.  Neurological: Negative for seizures and headaches.  Psychiatric/Behavioral:  Negative for hallucinations.      Allergies  Review of patient's allergies indicates no known allergies.  Home Medications   Prior to Admission medications   Medication Sig Start Date End Date Taking? Authorizing Provider  acetaminophen (ARTHRITIS PAIN) 650 MG CR tablet Take 1,300 mg by mouth every 8 (eight) hours as needed for pain.   Yes Historical Provider, MD  bisacodyl (DULCOLAX) 10 MG suppository Place 10 mg rectally daily as needed for moderate constipation.   Yes Historical Provider, MD  acetaminophen (TYLENOL) 500 MG tablet Take 1,500 mg by mouth every 6 (six) hours as needed for mild pain or moderate pain.    Historical Provider, MD  cephALEXin (KEFLEX) 500 MG capsule Take 1 capsule (500 mg total) by mouth 4 (four) times daily. Patient not taking: Reported on 03/23/2015 11/11/14   Daleen Bo, MD   BP 136/81 mmHg  Pulse 81  Temp(Src) 98.3 F (36.8 C) (Oral)  Resp 20  SpO2 100% Physical Exam  Constitutional: She is oriented to person, place, and time. She appears well-developed.  HENT:  Head: Normocephalic.  Eyes: Conjunctivae and EOM are normal. No scleral icterus.  Neck: Neck supple. No thyromegaly present.  Cardiovascular: Normal rate and regular rhythm.  Exam reveals no gallop and no friction rub.   No murmur heard. Pulmonary/Chest: No stridor. She has no wheezes. She has no rales. She exhibits no tenderness.  Abdominal: She exhibits no distension. There is tenderness. There is no rebound.  Mild tenderness to left lower quadrant  Musculoskeletal: Normal range of motion. She exhibits no edema.  Lymphadenopathy:    She has no cervical  adenopathy.  Neurological: She is oriented to person, place, and time. She exhibits normal muscle tone. Coordination normal.  Skin: No rash noted. No erythema.  Psychiatric: She has a normal mood and affect. Her behavior is normal.    ED Course  Procedures (including critical care time) Labs Review Labs Reviewed  COMPREHENSIVE  METABOLIC PANEL - Abnormal; Notable for the following:    Potassium 3.4 (*)    Glucose, Bld 106 (*)    All other components within normal limits  CBC - Abnormal; Notable for the following:    WBC 10.7 (*)    RBC 3.32 (*)    Hemoglobin 10.9 (*)    HCT 33.2 (*)    All other components within normal limits  URINALYSIS, ROUTINE W REFLEX MICROSCOPIC (NOT AT [facility]) - Abnormal; Notable for the following:    APPearance CLOUDY (*)    Ketones, ur 15 (*)    All other components within normal limits  LIPASE, BLOOD  I-STAT BETA HCG BLOOD, ED ([facility], [facility], [facility] ONLY)    Imaging Review Ct Abdomen Pelvis W Contrast  03/23/2015  CLINICAL DATA:  Lower abdominal pain, nausea, and constipation for 5 days. Severe pelvic pain 8/10. EXAM: CT ABDOMEN AND PELVIS WITH CONTRAST TECHNIQUE: Multidetector CT imaging of the abdomen and pelvis was performed using the standard protocol following bolus administration of intravenous contrast. CONTRAST:  1mL OMNIPAQUE IOHEXOL 300 MG/ML SOLN, 130mL OMNIPAQUE IOHEXOL 300 MG/ML SOLN COMPARISON:  None. FINDINGS: Lower chest:  No acute findings. Hepatobiliary: No masses or other significant abnormality. Pancreas: No mass, inflammatory changes, or other significant abnormality. Spleen: Within normal limits in size and appearance. Adrenals/Urinary Tract: No mass is identified on the RIGHT. 27 x 27 mm slightly hyper attenuating LEFT upper pole cyst, Bosniak 2. No follow-up is recommended. No evidence of hydronephrosis. Stomach/Bowel: No evidence of obstruction, inflammatory process, or abnormal fluid collections. Vascular/Lymphatic: No pathologically enlarged lymph nodes. No evidence of abdominal aortic aneurysm. Reproductive: In the region of the LEFT adnexae, there is a 78 x 84 x 67 mm fatty mass, Hounsfield attenuation -128, with debris in its most dependent portion consistent with a dermoid. The ovary on the LEFT is inseparable from the mass. Pelvic ultrasound with Doppler recommended to  exclude torsion. Other: Small amount of free fluid in the presacral space. Musculoskeletal:  No suspicious bone lesions identified. 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 In the setting of severe pelvic pain, pelvic ultrasound with Doppler recommended to exclude ovarian torsion. Findings discussed with ordering provider. Electronically Signed   By: Staci Righter M.D.   On: 03/23/2015 22:20   I have personally reviewed and evaluated these images and lab results as part of my medical decision-making.   EKG Interpretation None      MDM   Final diagnoses:  None    Abdominal pain. CT scan shows a centimeter by 8 cm mass in the left ovary. Radiologist recommends pelvic ultrasound. Patient will be discharged with pain medicine and given a GYN follow-up    Milton Ferguson, MD 03/23/15 2254

## 2015-04-07 ENCOUNTER — Ambulatory Visit (INDEPENDENT_AMBULATORY_CARE_PROVIDER_SITE_OTHER): Payer: Self-pay | Admitting: Obstetrics & Gynecology

## 2015-04-07 ENCOUNTER — Encounter: Payer: Self-pay | Admitting: Obstetrics & Gynecology

## 2015-04-07 VITALS — BP 138/62 | HR 72 | Temp 98.5°F | Wt 182.8 lb

## 2015-04-07 DIAGNOSIS — D271 Benign neoplasm of left ovary: Secondary | ICD-10-CM

## 2015-04-07 MED ORDER — IBUPROFEN 800 MG PO TABS: 800.0000 mg | ORAL_TABLET | Freq: Three times a day (TID) | ORAL | Status: DC | PRN

## 2015-04-07 NOTE — Patient Instructions (Addendum)
Ovarian Cyst An ovarian cyst is a fluid-filled sac that forms on an ovary. The ovaries are small organs that produce eggs in women. Various types of cysts can form on the ovaries. Most are not cancerous. Many do not cause problems, and they often go away on their own. Some may cause symptoms and require treatment. Common types of ovarian cysts include:  Functional cysts--These cysts may occur every month during the menstrual cycle. This is normal. The cysts usually go away with the next menstrual cycle if the woman does not get pregnant. Usually, there are no symptoms with a functional cyst.  Endometrioma cysts--These cysts form from the tissue that lines the uterus. They are also called "chocolate cysts" because they become filled with blood that turns brown. This type of cyst can cause pain in the lower abdomen during intercourse and with your menstrual period.  Cystadenoma cysts--This type develops from the cells on the outside of the ovary. These cysts can get very big and cause lower abdomen pain and pain with intercourse. This type of cyst can twist on itself, cut off its blood supply, and cause severe pain. It can also easily rupture and cause a lot of pain.  Dermoid cysts--This type of cyst is sometimes found in both ovaries. These cysts may contain different kinds of body tissue, such as skin, teeth, hair, or cartilage. They usually do not cause symptoms unless they get very big.  Theca lutein cysts--These cysts occur when too much of a certain hormone (human chorionic gonadotropin) is produced and overstimulates the ovaries to produce an egg. This is most common after procedures used to assist with the conception of a baby (in vitro fertilization). CAUSES   Fertility drugs can cause a condition in which multiple large cysts are formed on the ovaries. This is called ovarian hyperstimulation syndrome.  A condition called polycystic ovary syndrome can cause hormonal imbalances that can lead to  nonfunctional ovarian cysts. SIGNS AND SYMPTOMS  Many ovarian cysts do not cause symptoms. If symptoms are present, they may include:  Pelvic pain or pressure.  Pain in the lower abdomen.  Pain during sexual intercourse.  Increasing girth (swelling) of the abdomen.  Abnormal menstrual periods.  Increasing pain with menstrual periods.  Stopping having menstrual periods without being pregnant. DIAGNOSIS  These cysts are commonly found during a routine or annual pelvic exam. Tests may be ordered to find out more about the cyst. These tests may include:  Ultrasound.  X-ray of the pelvis.  CT scan.  MRI.  Blood tests. TREATMENT  Many ovarian cysts go away on their own without treatment. Your health care provider may want to check your cyst regularly for 2-3 months to see if it changes. For women in menopause, it is particularly important to monitor a cyst closely because of the higher rate of ovarian cancer in menopausal women. When treatment is needed, it may include any of the following:  A procedure to drain the cyst (aspiration). This may be done using a long needle and ultrasound. It can also be done through a laparoscopic procedure. This involves using a thin, lighted tube with a tiny camera on the end (laparoscope) inserted through a small incision.  Surgery to remove the whole cyst. This may be done using laparoscopic surgery or an open surgery involving a larger incision in the lower abdomen.  Hormone treatment or birth control pills. These methods are sometimes used to help dissolve a cyst. HOME CARE INSTRUCTIONS   Only take over-the-counter   or prescription medicines as directed by your health care provider.  Follow up with your health care provider as directed.  Get regular pelvic exams and Pap tests. SEEK MEDICAL CARE IF:   Your periods are late, irregular, or painful, or they stop.  Your pelvic pain or abdominal pain does not go away.  Your abdomen becomes  larger or swollen.  You have pressure on your bladder or trouble emptying your bladder completely.  You have pain during sexual intercourse.  You have feelings of fullness, pressure, or discomfort in your stomach.  You lose weight for no apparent reason.  You feel generally ill.  You become constipated.  You lose your appetite.  You develop acne.  You have an increase in body and facial hair.  You are gaining weight, without changing your exercise and eating habits.  You think you are pregnant. SEEK IMMEDIATE MEDICAL CARE IF:   You have increasing abdominal pain.  You feel sick to your stomach (nauseous), and you throw up (vomit).  You develop a fever that comes on suddenly.  You have abdominal pain during a bowel movement.  Your menstrual periods become heavier than usual. MAKE SURE YOU:  Understand these instructions.  Will watch your condition.  Will get help right away if you are not doing well or get worse.   This information is not intended to replace advice given to you by your health care provider. Make sure you discuss any questions you have with your health care provider.   Document Released: 01/29/2005 Document Revised: 02/03/2013 Document Reviewed: 10/06/2012 Elsevier Interactive Patient Education 2016 Elsevier [employer] Unilateral Salpingo-Oophorectomy Unilateral salpingo-oophorectomy is the surgical removal of one fallopian tube and ovary. The ovaries are small organs that produce eggs in women. The fallopian tubes transport the egg from the ovary to the womb (uterus). A unilateral salpingo-oophorectomy may be done for various reasons, including:  Infection in the fallopian tube and ovary.  Scar tissue in the fallopian tube and ovary (adhesions).  A cyst or tumor on the ovary.  A need to remove the fallopian tube and ovary when removing the uterus.  Cancer of the fallopian tube or ovary. The removal of one fallopian tube and ovary will not prevent  you from becoming pregnant, put you into menopause, or cause problems with your menstrual periods or sex drive. LET [facility] CARE PROVIDER KNOW ABOUT:  Any allergies you have.  All medicines you are taking, including vitamins, herbs, eye drops, creams, and over-the-counter medicines.  Previous problems you or members of your family have had with the use of anesthetics.  Any blood disorders you have.  Previous surgeries you have had.  Medical conditions you have. RISKS AND COMPLICATIONS  Generally, this is a safe procedure. However, as with any procedure, complications can occur. Possible complications include:  Injury to surrounding organs.  Bleeding.  Infection.  Blood clots in the legs or lungs.  Problems related to anesthesia. BEFORE THE PROCEDURE  Ask your health care provider about changing or stopping your regular medicines. You may need to stop taking certain medicines, such as aspirin or blood thinners, at least 1 week before the surgery.  Do not eat or drink anything for at least 8 hours before the surgery.  If you smoke, do not smoke for at least 2 weeks before the surgery.  Make plans to have someone drive you home after the procedure or after your hospital stay. Also arrange for someone to help you with activities during recovery. PROCEDURE  You will be given medicine to help you relax before the procedure (sedative). You will then be given medicine to make you sleep through the procedure (general anesthetic). These medicines will be given through an IV access tube that is put into one of your veins.  Once you are asleep, your lower abdomen will be shaved and cleaned. A thin, flexible tube (catheter) will be placed in your bladder.  The surgeon may use a laparoscopic, robotic, or open technique for this surgery:  In the laparoscopic technique, the surgery is done through two small cuts (incisions) in the abdomen. A thin, lighted tube with a tiny camera on the  end (laparoscope) is inserted into one of the incisions. The tools needed for the procedure are put through the other incision.  A robotic technique may be chosen to perform complex surgery in a small space. In the robotic technique, small incisions are made. A camera and surgical instruments are passed through the incisions. Surgical instruments are controlled with the help of a robotic arm.  In the open technique, the surgery is done through one large incision in the abdomen.  Using any of these techniques, the surgeon will remove the fallopian tube and ovary. The blood vessels will be clamped and tied.  The surgeon will then use staples or stitches to close the incision or incisions. AFTER THE PROCEDURE  You will be taken to a recovery area where your progress will be monitored for 1-3 hours. Your blood pressure, pulse, and temperature will be checked often. You will remain in the recovery area until you are stable and waking up.  If the laparoscopic technique was used, you may be allowed to go home after several hours. You may have some shoulder pain. This is normal and usually goes away in a day or two.  If the open technique was used, you will be admitted to the hospital for a couple of days.  You will be given pain medicine as necessary.  The IV tube and catheter will be removed before you are discharged.   This information is not intended to replace advice given to you by your health care provider. Make sure you discuss any questions you have with your health care provider.   Document Released: 11/26/2008 Document Revised: 02/03/2013 Document Reviewed: 07/23/2012 Elsevier Interactive Patient Education [employer].

## 2015-04-07 NOTE — Progress Notes (Signed)
CLINIC ENCOUNTER NOTE  History:  [name] here today for discussion about management of recently discovered 8 cm ovarian dermoid cyst. She denies any current abnormal vaginal discharge, bleeding, pelvic pain or other concerns.   History reviewed. No pertinent past medical history.  Past Surgical History  Procedure Laterality Date  . Tubal ligation    . Tubal ligation      The following portions of the patient's history were reviewed and updated as appropriate: allergies, current medications, past family history, past medical history, past social history, past surgical history and problem list.   Review of Systems:  Pertinent items noted in HPI and remainder of comprehensive ROS otherwise negative.  Objective:  Physical Exam BP 138/62 mmHg  Pulse 72  Temp(Src) 98.5 F (36.9 C)  Wt 182 lb 12.8 oz (82.918 kg) CONSTITUTIONAL: Well-developed, well-nourished female in no acute distress.  HENT:  Normocephalic, atraumatic. External right and left ear normal. Oropharynx is clear and moist EYES: Conjunctivae and EOM are normal. Pupils are equal, round, and reactive to light. No scleral icterus.  NECK: Normal range of motion, supple, no masses SKIN: Skin is warm and dry. No rash noted. Not diaphoretic. No erythema. No pallor. NEUROLOGIC: Alert and oriented to person, place, and time. Normal reflexes, muscle tone coordination. No cranial nerve deficit noted. PSYCHIATRIC: Normal mood and affect. Normal behavior. Normal judgment and thought content. CARDIOVASCULAR: Normal heart rate noted RESPIRATORY: Effort and breath sounds normal, no problems with respiration noted ABDOMEN: Soft, no distention noted, mild LLQ pain, no rebound or guarding   PELVIC: Deferred MUSCULOSKELETAL: Normal range of motion. No edema noted.  Labs and Imaging US Transvaginal Non-ob  03/23/2015  CLINICAL DATA:  Acute onset of pelvic pain. Abnormal CT of the pelvis. Initial encounter. EXAM: TRANSABDOMINAL AND  TRANSVAGINAL ULTRASOUND OF PELVIS DOPPLER ULTRASOUND OF OVARIES TECHNIQUE: Both transabdominal and transvaginal ultrasound examinations of the pelvis were performed. Transabdominal technique was performed for global imaging of the pelvis including uterus, ovaries, adnexal regions, and pelvic cul-de-sac. It was necessary to proceed with endovaginal exam following the transabdominal exam to visualize the left ovary in greater detail. Color and duplex Doppler ultrasound was utilized to evaluate blood flow to the ovaries. COMPARISON:  CT of the abdomen and pelvis performed earlier today at 9:54 p.m. FINDINGS: Uterus Measurements: 9.7 x 3.8 x 6.2 cm. No fibroids or other mass visualized. Endometrium Not characterized, as the uterus is not well assessed on transvaginal imaging due to the large left adnexal mass. Right ovary Measurements: 4.6 x 3.3 x 3.3 cm. Normal appearance/no adnexal mass. Left ovary Measurements: 9.2 x 7.1 x 8.5 cm. A large mostly fat containing dermoid cyst is noted at the left ovary, measuring 7.6 x 5.6 x 6.7 cm. Most of the fat demonstrates unusually low echogenicity, which corresponds to the unusually low attenuation of the fat on CT. Pulsed Doppler evaluation of both ovaries demonstrates normal low-resistance arterial and venous waveforms. Other findings No abnormal free fluid. IMPRESSION: 1. Large mostly fat containing dermoid cyst at the left ovary, measuring 7.6 cm. 2. No evidence for ovarian torsion. Uterus grossly unremarkable, though not fully assessed due to displacement. Electronically Signed   By: Garald Balding M.D.   On: 03/23/2015 23:52   US Pelvis Complete  03/23/2015  CLINICAL DATA:  Acute onset of pelvic pain. Abnormal CT of the pelvis. Initial encounter. EXAM: TRANSABDOMINAL AND TRANSVAGINAL ULTRASOUND OF PELVIS DOPPLER ULTRASOUND OF OVARIES TECHNIQUE: Both transabdominal and transvaginal ultrasound examinations of the pelvis were  performed. Transabdominal technique was performed  for global imaging of the pelvis including uterus, ovaries, adnexal regions, and pelvic cul-de-sac. It was necessary to proceed with endovaginal exam following the transabdominal exam to visualize the left ovary in greater detail. Color and duplex Doppler ultrasound was utilized to evaluate blood flow to the ovaries. COMPARISON:  CT of the abdomen and pelvis performed earlier today at 9:54 p.m. FINDINGS: Uterus Measurements: 9.7 x 3.8 x 6.2 cm. No fibroids or other mass visualized. Endometrium Not characterized, as the uterus is not well assessed on transvaginal imaging due to the large left adnexal mass. Right ovary Measurements: 4.6 x 3.3 x 3.3 cm. Normal appearance/no adnexal mass. Left ovary Measurements: 9.2 x 7.1 x 8.5 cm. A large mostly fat containing dermoid cyst is noted at the left ovary, measuring 7.6 x 5.6 x 6.7 cm. Most of the fat demonstrates unusually low echogenicity, which corresponds to the unusually low attenuation of the fat on CT. Pulsed Doppler evaluation of both ovaries demonstrates normal low-resistance arterial and venous waveforms. Other findings No abnormal free fluid. IMPRESSION: 1. Large mostly fat containing dermoid cyst at the left ovary, measuring 7.6 cm. 2. No evidence for ovarian torsion. Uterus grossly unremarkable, though not fully assessed due to displacement. Electronically Signed   By: Garald Balding M.D.   On: 03/23/2015 23:52   Ct Abdomen Pelvis W Contrast  03/23/2015  CLINICAL DATA:  Lower abdominal pain, nausea, and constipation for 5 days. Severe pelvic pain 8/10. EXAM: CT ABDOMEN AND PELVIS WITH CONTRAST TECHNIQUE: Multidetector CT imaging of the abdomen and pelvis was performed using the standard protocol following bolus administration of intravenous contrast. CONTRAST:  53mL OMNIPAQUE IOHEXOL 300 MG/ML SOLN, 160mL OMNIPAQUE IOHEXOL 300 MG/ML SOLN COMPARISON:  None. FINDINGS: Lower chest:  No acute findings. Hepatobiliary: No masses or other significant abnormality.  Pancreas: No mass, inflammatory changes, or other significant abnormality. Spleen: Within normal limits in size and appearance. Adrenals/Urinary Tract: No mass is identified on the RIGHT. 27 x 27 mm slightly hyper attenuating LEFT upper pole cyst, Bosniak 2. No follow-up is recommended. No evidence of hydronephrosis. Stomach/Bowel: No evidence of obstruction, inflammatory process, or abnormal fluid collections. Vascular/Lymphatic: No pathologically enlarged lymph nodes. No evidence of abdominal aortic aneurysm. Reproductive: In the region of the LEFT adnexae, there is a 78 x 84 x 67 mm fatty mass, Hounsfield attenuation -128, with debris in its most dependent portion consistent with a dermoid. The ovary on the LEFT is inseparable from the mass. Pelvic ultrasound with Doppler recommended to exclude torsion. Other: Small amount of free fluid in the presacral space. Musculoskeletal:  No suspicious bone lesions identified. 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 In the setting of severe pelvic pain, pelvic ultrasound with Doppler recommended to exclude ovarian torsion. Findings discussed with ordering provider. Electronically Signed   By: Staci Righter M.D.   On: 03/23/2015 22:20   [place] Art/ven Flow Abd Pelv Doppler  03/23/2015  CLINICAL DATA:  Acute onset of pelvic pain. Abnormal CT of the pelvis. Initial encounter. EXAM: TRANSABDOMINAL AND TRANSVAGINAL ULTRASOUND OF PELVIS DOPPLER ULTRASOUND OF OVARIES TECHNIQUE: Both transabdominal and transvaginal ultrasound examinations of the pelvis were performed. Transabdominal technique was performed for global imaging of the pelvis including uterus, ovaries, adnexal regions, and pelvic cul-de-sac. It was necessary to proceed with endovaginal exam following the transabdominal exam to visualize the left ovary in greater detail. Color and duplex Doppler ultrasound was utilized to evaluate blood flow  to the ovaries. COMPARISON:  CT of the  abdomen and pelvis performed earlier today at 9:54 p.m. FINDINGS: Uterus Measurements: 9.7 x 3.8 x 6.2 cm. No fibroids or other mass visualized. Endometrium Not characterized, as the uterus is not well assessed on transvaginal imaging due to the large left adnexal mass. Right ovary Measurements: 4.6 x 3.3 x 3.3 cm. Normal appearance/no adnexal mass. Left ovary Measurements: 9.2 x 7.1 x 8.5 cm. A large mostly fat containing dermoid cyst is noted at the left ovary, measuring 7.6 x 5.6 x 6.7 cm. Most of the fat demonstrates unusually low echogenicity, which corresponds to the unusually low attenuation of the fat on CT. Pulsed Doppler evaluation of both ovaries demonstrates normal low-resistance arterial and venous waveforms. Other findings No abnormal free fluid. IMPRESSION: 1. Large mostly fat containing dermoid cyst at the left ovary, measuring 7.6 cm. 2. No evidence for ovarian torsion. Uterus grossly unremarkable, though not fully assessed due to displacement. Electronically Signed   By: Garald Balding M.D.   On: 03/23/2015 23:52    Assessment & Plan:  Dermoid cyst of left ovary (8 cm) - CA 125 - ibuprofen (ADVIL,MOTRIN) 800 MG tablet; Take 1 tablet (800 mg total) by mouth 3 (three) times daily with meals as needed for headache or moderate pain.  Dispense: 30 tablet; Refill: 3 Patient counseled regarding need for surgical removal, she agrees with this plan.  Recommended laparoscopic unilateral salpingo-oophorectomy, possible laparotomy.  The risks of surgery were discussed in detail with the patient including but not limited to: bleeding which may require transfusion or reoperation; infection which may require prolonged hospitalization or re-hospitalization and antibiotic therapy; peritonitis for spillage of dermoid contents; injury to bowel, bladder, ureters and major vessels or other surrounding organs; need for additional procedures; thromboembolic phenomenon, incisional problems and other postoperative  or anesthesia complications including adhesive disease that may need further management.  Patient was told that the likelihood that her condition and symptoms will be treated effectively with this surgical management was very high; the postoperative expectations were also discussed in detail.  All questions were answered.  She was told that she will be contacted by our surgical scheduler regarding the time and date of her surgery; routine preoperative instructions of having nothing to eat or drink after midnight on the day prior to surgery and also coming to the hospital 1.5 hours prior to her time of surgery were also emphasized.  She was told she may be called for a preoperative appointment about a week prior to surgery and will be given further preoperative instructions at that visit. Printed patient education handouts about the procedure were given to the patient to review at home.  Routine preventative health maintenance measures emphasized. Please refer to After Visit Summary for other counseling recommendations.   Total face-to-face time with patient: 20 minutes. Over 50% of encounter was spent on counseling and coordination of care.   Verita Schneiders, MD, [place] Attending Obstetrician & Gynecologist, [place] [facility]

## 2015-04-08 ENCOUNTER — Encounter (HOSPITAL_COMMUNITY): Payer: Self-pay | Admitting: *Deleted

## 2015-04-08 LAB — CA 125: CA 125: 24 U/mL (ref <35)

## 2015-04-14 ENCOUNTER — Telehealth: Payer: Self-pay | Admitting: *Deleted

## 2015-04-14 NOTE — Telephone Encounter (Signed)
Madison Walker called and left a message this am taht she had a blood test and was supposed to get results ; also was supposed to hear surgery date and hasn't heard that yet either.  Reviewed chart and called Madison Walker and gave her result of CA-125 which was negative. Also informed her she should be getting a letter with surgery date and other information any day now, but that I can see a surgery date posted in April. She states she actually just went to her mailbox and the letter was there. She vocies understanding.

## 2015-05-17 NOTE — Patient Instructions (Addendum)
Your procedure is scheduled on:  Tuesday, May 31, 2015  Enter through the Main Entrance of [facility] at: 10:30 AM  Pick up the phone at the desk and dial [phone].  Call this number if you have problems the morning of surgery: [phone].  Remember: Do NOT eat food:  After Midnight Monday, May 30, 2015  Do NOT drink clear liquids after:  8:00 AM day of surgery  Take these medicines the morning of surgery with a SIP OF WATER:  None  Do NOT wear jewelry (body piercing), metal hair clips/bobby pins, make-up, or nail polish. Do NOT wear lotions, powders, or perfumes.  You may wear deodorant. Do NOT shave for 48 hours prior to surgery. Do NOT bring valuables to the hospital. Contacts, dentures, or bridgework may not be worn into surgery. Leave suitcase in car.  After surgery it may be brought to your room.  For patients admitted to the hospital, checkout time is 11:00 AM the day of discharge. Have a responsible adult drive you home and stay with you for 24 hours after your procedure

## 2015-05-19 ENCOUNTER — Encounter (HOSPITAL_COMMUNITY): Payer: Self-pay

## 2015-05-19 ENCOUNTER — Encounter (HOSPITAL_COMMUNITY)
Admission: RE | Admit: 2015-05-19 | Discharge: 2015-05-19 | Disposition: A | Discharge status: Home / Self Care | Payer: Self-pay | Source: Ambulatory Visit | Attending: Obstetrics & Gynecology | Admitting: Obstetrics & Gynecology

## 2015-05-19 DIAGNOSIS — D271 Benign neoplasm of left ovary: Secondary | ICD-10-CM | POA: Insufficient documentation

## 2015-05-19 DIAGNOSIS — Z01812 Encounter for preprocedural laboratory examination: Secondary | ICD-10-CM | POA: Insufficient documentation

## 2015-05-19 LAB — CBC
HCT: 37.8 % (ref 36.0–46.0)
HEMOGLOBIN: 12.8 g/dL (ref 12.0–15.0)
MCH: 32.8 pg (ref 26.0–34.0)
MCHC: 33.9 g/dL (ref 30.0–36.0)
MCV: 96.9 fL (ref 78.0–100.0)
Platelets: 227 10*3/uL (ref 150–400)
RBC: 3.9 MIL/uL (ref 3.87–5.11)
RDW: 12.9 % (ref 11.5–15.5)
WBC: 7.1 10*3/uL (ref 4.0–10.5)

## 2015-05-19 LAB — ABO/RH: ABO/RH(D): O POS

## 2015-05-19 LAB — TYPE AND SCREEN
ABO/RH(D): O POS
ANTIBODY SCREEN: NEGATIVE

## 2015-05-30 ENCOUNTER — Encounter (HOSPITAL_COMMUNITY): Payer: Self-pay | Admitting: Obstetrics & Gynecology

## 2015-05-30 NOTE — Anesthesia Preprocedure Evaluation (Addendum)
Anesthesia Evaluation  Patient identified by MRN, date of birth, ID band Patient awake    Reviewed: Allergy & Precautions, NPO status , Patient's Chart, lab work & pertinent test results  Airway Mallampati: II   Neck ROM: Full    Dental  (+) Teeth Intact, Dental Advisory Given   Pulmonary neg pulmonary ROS,    breath sounds clear to auscultation       Cardiovascular negative cardio ROS   Rhythm:Regular     Neuro/Psych negative neurological ROS  negative psych ROS   GI/Hepatic negative GI ROS, Neg liver ROS,   Endo/Other  negative endocrine ROS  Renal/GU negative Renal ROS  negative genitourinary   Musculoskeletal negative musculoskeletal ROS (+)   Abdominal   Peds negative pediatric ROS (+)  Hematology negative hematology ROS (+) 13/38   Anesthesia Other Findings   Reproductive/Obstetrics negative OB ROS SP BTL                            Anesthesia Physical Anesthesia Plan  ASA: I  Anesthesia Plan: General   Post-op Pain Management:    Induction: Intravenous  Airway Management Planned: Oral ETT  Additional Equipment:   Intra-op Plan:   Post-operative Plan: Extubation in OR  Informed Consent: I have reviewed the patients History and Physical, chart, labs and discussed the procedure including the risks, benefits and alternatives for the proposed anesthesia with the patient or authorized representative who has indicated his/her understanding and acceptance.     Plan Discussed with:   Anesthesia Plan Comments:         Anesthesia Quick Evaluation

## 2015-05-31 ENCOUNTER — Encounter: Payer: Self-pay | Admitting: Obstetrics & Gynecology

## 2015-05-31 ENCOUNTER — Ambulatory Visit (HOSPITAL_COMMUNITY): Payer: Self-pay | Admitting: Anesthesiology

## 2015-05-31 ENCOUNTER — Ambulatory Visit (HOSPITAL_COMMUNITY)
Admission: RE | Admit: 2015-05-31 | Discharge: 2015-05-31 | Disposition: A | Discharge status: Home / Self Care | Payer: Self-pay | Source: Ambulatory Visit | Attending: Obstetrics & Gynecology | Admitting: Obstetrics & Gynecology

## 2015-05-31 ENCOUNTER — Encounter (HOSPITAL_COMMUNITY)
Admission: RE | Disposition: A | Discharge status: Home / Self Care | Payer: Self-pay | Source: Ambulatory Visit | Attending: Obstetrics & Gynecology

## 2015-05-31 ENCOUNTER — Encounter (HOSPITAL_COMMUNITY): Payer: Self-pay | Admitting: Emergency Medicine

## 2015-05-31 DIAGNOSIS — N838 Other noninflammatory disorders of ovary, fallopian tube and broad ligament: Secondary | ICD-10-CM | POA: Insufficient documentation

## 2015-05-31 DIAGNOSIS — D271 Benign neoplasm of left ovary: Secondary | ICD-10-CM | POA: Insufficient documentation

## 2015-05-31 DIAGNOSIS — Z7982 Long term (current) use of aspirin: Secondary | ICD-10-CM | POA: Insufficient documentation

## 2015-05-31 DIAGNOSIS — R102 Pelvic and perineal pain: Secondary | ICD-10-CM | POA: Insufficient documentation

## 2015-05-31 HISTORY — PX: UNILATERAL SALPINGECTOMY: SHX6160

## 2015-05-31 HISTORY — PX: LAPAROSCOPIC UNILATERAL SALPINGO OOPHERECTOMY: SHX5935

## 2015-05-31 SURGERY — SALPINGO-OOPHORECTOMY, UNILATERAL, LAPAROSCOPIC
Anesthesia: General | Site: Abdomen | Laterality: Right

## 2015-05-31 MED ORDER — ONDANSETRON HCL 4 MG/2ML IJ SOLN
INTRAMUSCULAR | Status: AC
  Filled 2015-05-31: qty 2

## 2015-05-31 MED ORDER — KETOROLAC TROMETHAMINE 30 MG/ML IJ SOLN
INTRAMUSCULAR | Status: AC
  Filled 2015-05-31: qty 1

## 2015-05-31 MED ORDER — MIDAZOLAM HCL 2 MG/2ML IJ SOLN
INTRAMUSCULAR | Status: DC | PRN
  Administered 2015-05-31: 2 mg via INTRAVENOUS

## 2015-05-31 MED ORDER — GLYCOPYRROLATE 0.2 MG/ML IJ SOLN
INTRAMUSCULAR | Status: AC
  Filled 2015-05-31: qty 2

## 2015-05-31 MED ORDER — ROCURONIUM BROMIDE 100 MG/10ML IV SOLN
INTRAVENOUS | Status: DC | PRN
  Administered 2015-05-31: 40 mg via INTRAVENOUS

## 2015-05-31 MED ORDER — LIDOCAINE HCL (CARDIAC) 20 MG/ML IV SOLN
INTRAVENOUS | Status: DC | PRN
  Administered 2015-05-31: 100 mg via INTRAVENOUS

## 2015-05-31 MED ORDER — SCOPOLAMINE 1 MG/3DAYS TD PT72
MEDICATED_PATCH | TRANSDERMAL | Status: AC
  Administered 2015-05-31: 1.5 mg via TRANSDERMAL
  Filled 2015-05-31: qty 1

## 2015-05-31 MED ORDER — BUPIVACAINE HCL (PF) 0.5 % IJ SOLN
INTRAMUSCULAR | Status: AC
  Filled 2015-05-31: qty 30

## 2015-05-31 MED ORDER — KETOROLAC TROMETHAMINE 30 MG/ML IJ SOLN
INTRAMUSCULAR | Status: DC | PRN
  Administered 2015-05-31: 30 mg via INTRAVENOUS

## 2015-05-31 MED ORDER — PROPOFOL 10 MG/ML IV BOLUS
INTRAVENOUS | Status: AC
  Filled 2015-05-31: qty 20

## 2015-05-31 MED ORDER — SCOPOLAMINE 1 MG/3DAYS TD PT72
1.0000 | MEDICATED_PATCH | Freq: Once | TRANSDERMAL | Status: DC
  Administered 2015-05-31: 1.5 mg via TRANSDERMAL

## 2015-05-31 MED ORDER — MIDAZOLAM HCL 2 MG/2ML IJ SOLN
INTRAMUSCULAR | Status: AC
  Filled 2015-05-31: qty 2

## 2015-05-31 MED ORDER — LACTATED RINGERS IV SOLN
INTRAVENOUS | Status: DC
  Administered 2015-05-31 (×3): via INTRAVENOUS

## 2015-05-31 MED ORDER — PROMETHAZINE HCL 25 MG PO TABS: 25.0000 mg | ORAL_TABLET | Freq: Four times a day (QID) | ORAL | Status: DC | PRN

## 2015-05-31 MED ORDER — FENTANYL CITRATE (PF) 250 MCG/5ML IJ SOLN
INTRAMUSCULAR | Status: AC
  Filled 2015-05-31: qty 5

## 2015-05-31 MED ORDER — DOCUSATE SODIUM 100 MG PO CAPS: 100.0000 mg | ORAL_CAPSULE | Freq: Two times a day (BID) | ORAL | Status: DC | PRN

## 2015-05-31 MED ORDER — DEXTROSE 5 % IV SOLN
2.0000 g | INTRAVENOUS | Status: AC
  Administered 2015-05-31: 2 g via INTRAVENOUS
  Filled 2015-05-31: qty 20

## 2015-05-31 MED ORDER — FENTANYL CITRATE (PF) 100 MCG/2ML IJ SOLN
INTRAMUSCULAR | Status: DC | PRN
  Administered 2015-05-31 (×3): 50 ug via INTRAVENOUS

## 2015-05-31 MED ORDER — BUPIVACAINE HCL (PF) 0.5 % IJ SOLN
INTRAMUSCULAR | Status: DC | PRN
  Administered 2015-05-31: 18 mL

## 2015-05-31 MED ORDER — AZITHROMYCIN 250 MG PO TABS: ORAL_TABLET | ORAL | Status: DC

## 2015-05-31 MED ORDER — NEOSTIGMINE METHYLSULFATE 10 MG/10ML IV SOLN
INTRAVENOUS | Status: AC
  Filled 2015-05-31: qty 1

## 2015-05-31 MED ORDER — LACTATED RINGERS IR SOLN
Status: DC | PRN
Start: 2015-05-31 — End: 2015-05-31
  Administered 2015-05-31: 3000 mL

## 2015-05-31 MED ORDER — EPHEDRINE SULFATE 50 MG/ML IJ SOLN
INTRAMUSCULAR | Status: DC | PRN
  Administered 2015-05-31: 10 mg via INTRAVENOUS

## 2015-05-31 MED ORDER — DEXAMETHASONE SODIUM PHOSPHATE 4 MG/ML IJ SOLN
INTRAMUSCULAR | Status: AC
  Filled 2015-05-31: qty 1

## 2015-05-31 MED ORDER — NEOSTIGMINE METHYLSULFATE 10 MG/10ML IV SOLN
INTRAVENOUS | Status: DC | PRN
  Administered 2015-05-31: 2 mg via INTRAVENOUS

## 2015-05-31 MED ORDER — EPHEDRINE 5 MG/ML INJ
INTRAVENOUS | Status: AC
  Filled 2015-05-31: qty 10

## 2015-05-31 MED ORDER — PROPOFOL 10 MG/ML IV BOLUS
INTRAVENOUS | Status: DC | PRN
  Administered 2015-05-31: 150 mg via INTRAVENOUS

## 2015-05-31 MED ORDER — LACTATED RINGERS IV SOLN
INTRAVENOUS | Status: DC
Start: 2015-05-31 — End: 2015-05-31

## 2015-05-31 MED ORDER — DEXAMETHASONE SODIUM PHOSPHATE 10 MG/ML IJ SOLN
INTRAMUSCULAR | Status: DC | PRN
  Administered 2015-05-31: 4 mg via INTRAVENOUS

## 2015-05-31 MED ORDER — LIDOCAINE HCL (CARDIAC) 20 MG/ML IV SOLN
INTRAVENOUS | Status: AC
  Filled 2015-05-31: qty 5

## 2015-05-31 MED ORDER — IBUPROFEN 800 MG PO TABS: 800.0000 mg | ORAL_TABLET | Freq: Three times a day (TID) | ORAL | Status: DC | PRN

## 2015-05-31 MED ORDER — CEFAZOLIN SODIUM-DEXTROSE 2-3 GM-% IV SOLR
INTRAVENOUS | Status: AC
  Filled 2015-05-31: qty 50

## 2015-05-31 MED ORDER — OXYCODONE-ACETAMINOPHEN 5-325 MG PO TABS: 1.0000 | ORAL_TABLET | Freq: Four times a day (QID) | ORAL | Status: DC | PRN

## 2015-05-31 MED ORDER — GLYCOPYRROLATE 0.2 MG/ML IJ SOLN
INTRAMUSCULAR | Status: DC | PRN
  Administered 2015-05-31: 0.4 mg via INTRAVENOUS
  Administered 2015-05-31: 0.2 mg via INTRAVENOUS

## 2015-05-31 SURGICAL SUPPLY — 71 items
APL SKNCLS STERI-STRIP NONHPOA (GAUZE/BANDAGES/DRESSINGS) ×4
BAG SPEC RTRVL LRG 6X4 10 (ENDOMECHANICALS)
BARRIER ADHS 3X4 INTERCEED (GAUZE/BANDAGES/DRESSINGS) IMPLANT
BENZOIN TINCTURE PRP APPL 2/3 (GAUZE/BANDAGES/DRESSINGS) ×6 IMPLANT
BRR ADH 4X3 ABS CNTRL BYND (GAUZE/BANDAGES/DRESSINGS)
CABLE HIGH FREQUENCY MONO STRZ (ELECTRODE) IMPLANT
CANISTER SUCT 3000ML (MISCELLANEOUS) ×6 IMPLANT
CATH ROBINSON RED A/P 16FR (CATHETERS) ×6 IMPLANT
CELLS DAT CNTRL 66122 CELL SVR (MISCELLANEOUS) IMPLANT
CLOTH BEACON ORANGE TIMEOUT ST (SAFETY) ×6 IMPLANT
CONT PATH 16OZ SNAP LID 3702 (MISCELLANEOUS) ×6 IMPLANT
CONT SPECI 4OZ STER CLIK (MISCELLANEOUS) IMPLANT
DECANTER SPIKE VIAL GLASS SM (MISCELLANEOUS) ×5 IMPLANT
DRAPE WARM FLUID 44X44 (DRAPE) IMPLANT
DRSG COVADERM PLUS 2X2 (GAUZE/BANDAGES/DRESSINGS) ×12 IMPLANT
DRSG OPSITE POSTOP 3X4 (GAUZE/BANDAGES/DRESSINGS) ×4 IMPLANT
DRSG OPSITE POSTOP 4X10 (GAUZE/BANDAGES/DRESSINGS) ×1 IMPLANT
DURAPREP 26ML APPLICATOR (WOUND CARE) ×6 IMPLANT
ELECT LIGASURE SHORT 9 REUSE (ELECTRODE) IMPLANT
GAUZE SPONGE 4X4 16PLY XRAY LF (GAUZE/BANDAGES/DRESSINGS) IMPLANT
GLOVE BIO SURGEON STRL SZ 6.5 (GLOVE) ×5 IMPLANT
GLOVE BIO SURGEONS STRL SZ 6.5 (GLOVE) ×1
GLOVE BIOGEL PI IND STRL 7.0 (GLOVE) ×12 IMPLANT
GLOVE BIOGEL PI INDICATOR 7.0 (GLOVE) ×6
GLOVE ECLIPSE 7.0 STRL STRAW (GLOVE) ×6 IMPLANT
GOWN STRL REUS W/TWL LRG LVL3 (GOWN DISPOSABLE) ×12 IMPLANT
LIQUID BAND (GAUZE/BANDAGES/DRESSINGS) ×6 IMPLANT
MANIPULATOR UTERINE 4.5 ZUMI (MISCELLANEOUS) ×4 IMPLANT
NEEDLE HYPO 22GX1.5 SAFETY (NEEDLE) ×6 IMPLANT
NS IRRIG 1000ML POUR BTL (IV SOLUTION) ×6 IMPLANT
PACK ABDOMINAL GYN (CUSTOM PROCEDURE TRAY) ×1 IMPLANT
PACK LAPAROSCOPY BASIN (CUSTOM PROCEDURE TRAY) ×6 IMPLANT
PAD OB MATERNITY 4.3X12.25 (PERSONAL CARE ITEMS) ×6 IMPLANT
PAD TRENDELENBURG POSITION (MISCELLANEOUS) ×6 IMPLANT
PENCIL SMOKE EVAC W/HOLSTER (ELECTROSURGICAL) ×6 IMPLANT
POUCH ENDO CATCH II 15MM (MISCELLANEOUS) ×5 IMPLANT
POUCH SPECIMEN RETRIEVAL 10MM (ENDOMECHANICALS) IMPLANT
RETRACTOR WND ALEXIS 18 MED (MISCELLANEOUS) IMPLANT
RETRACTOR WND ALEXIS 25 LRG (MISCELLANEOUS) IMPLANT
RTRCTR WOUND ALEXIS 18CM MED (MISCELLANEOUS)
RTRCTR WOUND ALEXIS 25CM LRG (MISCELLANEOUS)
SCRUB PCMX 4 OZ (MISCELLANEOUS) ×12 IMPLANT
SEALER TISSUE G2 CVD JAW 35 (ENDOMECHANICALS) IMPLANT
SEALER TISSUE G2 CVD JAW 45CM (ENDOMECHANICALS)
SET IRRIG TUBING LAPAROSCOPIC (IRRIGATION / IRRIGATOR) ×5 IMPLANT
SHEARS HARMONIC ACE PLUS 36CM (ENDOMECHANICALS) ×4 IMPLANT
SLEEVE XCEL OPT CAN 5 100 (ENDOMECHANICALS) ×10 IMPLANT
SPONGE GAUZE 4X4 12PLY STER LF (GAUZE/BANDAGES/DRESSINGS) ×2 IMPLANT
SPONGE LAP 18X18 X RAY DECT (DISPOSABLE) ×4 IMPLANT
STAPLER VISISTAT 35W (STAPLE) IMPLANT
SUT MNCRL AB 4-0 PS2 18 (SUTURE) ×6 IMPLANT
SUT MON AB-0 CT1 36 (SUTURE) ×4 IMPLANT
SUT PDS AB 0 CTX 60 (SUTURE) IMPLANT
SUT PLAIN 2 0 (SUTURE)
SUT PLAIN ABS 2-0 CT1 27XMFL (SUTURE) IMPLANT
SUT VIC AB 0 CT1 18XCR BRD8 (SUTURE) ×6 IMPLANT
SUT VIC AB 0 CT1 27 (SUTURE)
SUT VIC AB 0 CT1 27XBRD ANBCTR (SUTURE) ×2 IMPLANT
SUT VIC AB 0 CT1 8-18 (SUTURE)
SUT VIC AB 0 CTX 36 (SUTURE)
SUT VIC AB 0 CTX36XBRD ANBCTRL (SUTURE) IMPLANT
SUT VIC AB 4-0 KS 27 (SUTURE) IMPLANT
SUT VICRYL 0 TIES 12 18 (SUTURE) ×6 IMPLANT
SUT VICRYL 0 UR6 27IN ABS (SUTURE) IMPLANT
SYR CONTROL 10ML LL (SYRINGE) ×6 IMPLANT
TOWEL OR 17X24 6PK STRL BLUE (TOWEL DISPOSABLE) ×12 IMPLANT
TRAY FOLEY CATH SILVER 14FR (SET/KITS/TRAYS/PACK) ×6 IMPLANT
TROCAR XCEL NON-BLD 11X100MML (ENDOMECHANICALS) ×6 IMPLANT
TROCAR XCEL NON-BLD 5MMX100MML (ENDOMECHANICALS) ×6 IMPLANT
TROCAR XCEL OPT SLVE 5M 100M (ENDOMECHANICALS) ×5 IMPLANT
WATER STERILE IRR 1000ML POUR (IV SOLUTION) ×2 IMPLANT

## 2015-05-31 NOTE — Op Note (Signed)
Madison Walker PROCEDURE DATE: 05/31/2015  PREOPERATIVE DIAGNOSES: Large ovarian dermoid cyst POSTOPERATIVE DIAGNOSES: 8 cm left ovarian dermoid cyst PROCEDURE: Laparoscopic left salpingoophorectomy, prophylactic right salpingectomy SURGEON:  Dr. Verita Schneiders ASSISTANT: Dr. Lavonia Drafts ANESTHESIOLOGIST: Dr. Alexis Frock, Sr  INDICATIONS: 50 y.o. [id number] with aforementioned preoperative diagnosis here today for definitive surgical management.   Risks of surgery were discussed with the patient including but not limited to: bleeding which may require transfusion or reoperation; infection which may require antibiotics; injury to bowel, bladder, ureters or other surrounding organs; need for additional procedures including laparotomy; thromboembolic phenomenon, incisional problems and other postoperative/anesthesia complications. Written informed consent was obtained.    FINDINGS:  Small uterus, left adnexa with large dermoid cyst. Normal right adnexa with small paratubal cyst noted at the distal end of the tube.  Left adnexa was significantly adherent to posterior cul-de-sac, left pelvic side wall and bowel.  These adhesions were mostly filmy and able to be lysed bluntly. No other significant abdominal/pelvic abnormality.  Normal upper abdomen.  ANESTHESIA:    General INTRAVENOUS FLUIDS: 1600 ml ESTIMATED BLOOD LOSS: 50 ml SPECIMENS:  Left ovary and bilateral fallopian tubes COMPLICATIONS: None immediate   PROCEDURE IN DETAIL:  The patient received intravenous antibiotics and had sequential compression devices applied to her lower extremities while in the preoperative area.  She was then taken to the operating room where general anesthesia was administered and was found to be adequate.  She was placed in the dorsal lithotomy position, and was prepped and draped in a sterile manner.  A Foley catheter was inserted into her bladder and attached to constant drainage and a ZUMI uterine  manipulator was then advanced into the uterus.  After an adequate timeout was performed, attention was then turned to the patient's abdomen where a 5-mm skin incision was made in the umbilical fold.  The Optiview 5-mm trocar and sleeve were then advanced without difficulty with the laparoscope under direct visualization into the abdomen.  The abdomen was then insufflated with carbon dioxide gas.  Adequate pneumoperitoneum was obtained.  A survey of the patient's pelvis and abdomen revealed the findings above. Bilateral 5-mm lower quadrant ports were then placed under direct visualization. On the left side, significant adhesions were lysed bluntly to free the adnexa.  The left uteroovarian ligament was then clamped and transected with the Harmonic device.  The left infundibulopelvic ligament was also clamped and transected allowing for salpingooophorectomy.   The right fallopian tube was also excised using the Harmonic device.  Excellent hemostasis was noted.   The left port was expanded to 15 mm and the 15-mm trocar was placed.   The 15-mm Endocatch was placed and the specimens were put inside the bag.  The 15-mm trocar was removed and the bag containing the specimen was brought up to the 15-mm incision.  An incision was made into the dermoid cyst, there was immediate return of sebaceous material and hair that was suctioned in the bag.  The ovary was able to be decompressed in this manner, and the bag containing the deflated specimen was then removed from the abdomen under direct visualization.  The operative site was surveyed, and it was found to be hemostatic.  No intraoperative injury to other surrounding organs was noted.  The abdomen was desufflated and all instruments were then removed from the patient's abdomen. The fascial incision of the left port was closed with a 0 Vicryl figure of eight stitch.  All skin incisions were closed with 3-0 Vicryl  subcuticular stitches and Dermabond.   The patient will be  discharged to home as per PACU criteria.  Routine postoperative instructions given.  She was prescribed Percocet, Ibuprofen and Colace.  She will follow up in the clinic on 06/20/15 for postoperative evaluation.    Verita Schneiders, MD, [place] Attending Obstetrician & Gynecologist, [place], House for [employer]

## 2015-05-31 NOTE — H&P (Signed)
Preoperative History and Physical  Madison Walker is a 50 y.o. G2P2 here for surgical management of large ovarian dermoid cyst.   No significant preoperative concerns.  Proposed surgery:   Laparoscopic unilateral salpingo-oophorectomy, possible laparotomy.  History reviewed. No pertinent past medical history.   Past Surgical History  Procedure Laterality Date  . Tubal ligation     OB History  Gravida Para Term Preterm AB SAB TAB Ectopic Multiple Living  2 2 2       2     # Outcome Date GA Lbr Len/2nd Weight Sex Delivery Anes PTL Lv  2 Term      Vag-Spont     1 Term      Vag-Spont       Patient denies any other pertinent gynecologic issues. No period since July 2016.   No current facility-administered medications on file prior to encounter.   Current Outpatient Prescriptions on File Prior to Encounter  Medication Sig Dispense Refill  . acetaminophen (ARTHRITIS PAIN) 650 MG CR tablet Take 1,300 mg by mouth every 8 (eight) hours as needed for pain. Reported on 04/07/2015    . acetaminophen (TYLENOL) 500 MG tablet Take 1,500 mg by mouth every 6 (six) hours as needed for mild pain or moderate pain. Reported on 04/07/2015    . bisacodyl (DULCOLAX) 10 MG suppository Place 10 mg rectally daily as needed for moderate constipation. Reported on 04/07/2015    . ibuprofen (ADVIL,MOTRIN) 800 MG tablet Take 1 tablet (800 mg total) by mouth 3 (three) times daily with meals as needed for headache or moderate pain. 30 tablet 3  . HYDROcodone-acetaminophen (NORCO/VICODIN) 5-325 MG tablet Take 1 tablet by mouth every 6 (six) hours as needed for moderate pain. (Patient not taking: Reported on 04/07/2015) 20 tablet 0  . ondansetron (ZOFRAN ODT) 4 MG disintegrating tablet 4mg  ODT q4 hours prn nausea/vomit (Patient not taking: Reported on 04/07/2015) 12 tablet 0   No Known Allergies  Social History:   reports that she has never smoked. She has never used smokeless tobacco. She reports that she does not drink  alcohol or use illicit drugs.  History reviewed. No pertinent family history.  Review of Systems: Noncontributory  PHYSICAL EXAM: Blood pressure 134/83, pulse 66, temperature 97.9 F (36.6 C), temperature source Oral, resp. rate 18, last menstrual period 08/30/2014, SpO2 100 %. CONSTITUTIONAL: Well-developed, well-nourished female in no acute distress.  HENT:  Normocephalic, atraumatic, External right and left ear normal. Oropharynx is clear and moist EYES: Conjunctivae and EOM are normal. Pupils are equal, round, and reactive to light. No scleral icterus.  NECK: Normal range of motion, supple, no masses SKIN: Skin is warm and dry. No rash noted. Not diaphoretic. No erythema. No pallor. NEUROLOGIC: Alert and oriented to person, place, and time. Normal reflexes, muscle tone coordination. No cranial nerve deficit noted. PSYCHIATRIC: Normal mood and affect. Normal behavior. Normal judgment and thought content. CARDIOVASCULAR: Normal heart rate noted, regular rhythm RESPIRATORY: Effort and breath sounds normal, no problems with respiration noted ABDOMEN: Soft, nontender, nondistended. PELVIC: Deferred MUSCULOSKELETAL: Normal range of motion. No edema and no tenderness. 2+ distal pulses.  Labs: Results for orders placed or performed during the hospital encounter of 05/31/15 (from the past 336 hour(s))  ABO/Rh   Collection Time: 05/19/15  3:00 PM  Result Value Ref Range   ABO/RH(D) O POS   Results for orders placed or performed during the hospital encounter of 05/19/15 (from the past 336 hour(s))  CBC   Collection Time: 05/19/15  3:00 PM  Result Value Ref Range   WBC 7.1 4.0 - 10.5 K/uL   RBC 3.90 3.87 - 5.11 MIL/uL   Hemoglobin 12.8 12.0 - 15.0 g/dL   HCT 37.8 36.0 - 46.0 %   MCV 96.9 78.0 - 100.0 fL   MCH 32.8 26.0 - 34.0 pg   MCHC 33.9 30.0 - 36.0 g/dL   RDW 12.9 11.5 - 15.5 %   Platelets 227 150 - 400 K/uL  Type and screen [place]   Collection Time:  05/19/15  3:00 PM  Result Value Ref Range   ABO/RH(D) O POS    Antibody Screen NEG    Sample Expiration 06/02/2015    Extend sample reason NO TRANSFUSIONS OR PREGNANCY IN THE PAST 3 MONTHS    04/07/2015 CA-125  24   Imaging Studies: 03/23/2015  CLINICAL DATA:  Acute onset of pelvic pain. Abnormal CT of the pelvis. Initial encounter. EXAM: TRANSABDOMINAL AND TRANSVAGINAL ULTRASOUND OF PELVIS DOPPLER ULTRASOUND OF OVARIES TECHNIQUE: Both transabdominal and transvaginal ultrasound examinations of the pelvis were performed. Transabdominal technique was performed for global imaging of the pelvis including uterus, ovaries, adnexal regions, and pelvic cul-de-sac. It was necessary to proceed with endovaginal exam following the transabdominal exam to visualize the left ovary in greater detail. Color and duplex Doppler ultrasound was utilized to evaluate blood flow to the ovaries. COMPARISON:  CT of the abdomen and pelvis performed earlier today at 9:54 p.m. FINDINGS: Uterus Measurements: 9.7 x 3.8 x 6.2 cm. No fibroids or other mass visualized. Endometrium Not characterized, as the uterus is not well assessed on transvaginal imaging due to the large left adnexal mass. Right ovary Measurements: 4.6 x 3.3 x 3.3 cm. Normal appearance/no adnexal mass. Left ovary Measurements: 9.2 x 7.1 x 8.5 cm. A large mostly fat containing dermoid cyst is noted at the left ovary, measuring 7.6 x 5.6 x 6.7 cm. Most of the fat demonstrates unusually low echogenicity, which corresponds to the unusually low attenuation of the fat on CT. Pulsed Doppler evaluation of both ovaries demonstrates normal low-resistance arterial and venous waveforms. Other findings No abnormal free fluid. IMPRESSION: 1. Large mostly fat containing dermoid cyst at the left ovary, measuring 7.6 cm. 2. No evidence for ovarian torsion. Uterus grossly unremarkable, though not fully assessed due to displacement. Electronically Signed   By: Garald Balding M.D.   On:  03/23/2015 23:52    03/23/2015  CLINICAL DATA:  Lower abdominal pain, nausea, and constipation for 5 days. Severe pelvic pain 8/10. EXAM: CT ABDOMEN AND PELVIS WITH CONTRAST TECHNIQUE: Multidetector CT imaging of the abdomen and pelvis was performed using the standard protocol following bolus administration of intravenous contrast. CONTRAST:  87mL OMNIPAQUE IOHEXOL 300 MG/ML SOLN, 141mL OMNIPAQUE IOHEXOL 300 MG/ML SOLN COMPARISON:  None. FINDINGS: Lower chest:  No acute findings. Hepatobiliary: No masses or other significant abnormality. Pancreas: No mass, inflammatory changes, or other significant abnormality. Spleen: Within normal limits in size and appearance. Adrenals/Urinary Tract: No mass is identified on the RIGHT. 27 x 27 mm slightly hyper attenuating LEFT upper pole cyst, Bosniak 2. No follow-up is recommended. No evidence of hydronephrosis. Stomach/Bowel: No evidence of obstruction, inflammatory process, or abnormal fluid collections. Vascular/Lymphatic: No pathologically enlarged lymph nodes. No evidence of abdominal aortic aneurysm. Reproductive: In the region of the LEFT adnexae, there is a 78 x 84 x 67 mm fatty mass, Hounsfield attenuation -128, with debris in its most dependent portion consistent with a dermoid. The ovary on the LEFT  is inseparable from the mass. Pelvic ultrasound with Doppler recommended to exclude torsion. Other: Small amount of free fluid in the presacral space. Musculoskeletal:  No suspicious bone lesions identified. 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 In the setting of severe pelvic pain, pelvic ultrasound with Doppler recommended to exclude ovarian torsion. Findings discussed with ordering provider. Electronically Signed   By: Staci Righter M.D.   On: 03/23/2015 22:20   laparoscopic unilateral salpingo-oophorectomy, possible laparotomy.  Assessment: Patient Active Problem List   Diagnosis Date Noted  . Dermoid cyst of ovary (8 cm)  04/07/2015    Plan: Patient will undergo surgical management with laparoscopic unilateral salpingo-oophorectomy, possible laparotomy for her large dermoid cyst.  The risks of surgery were discussed in detail with the patient including but not limited to: bleeding which may require transfusion or reoperation; infection which may require prolonged hospitalization or re-hospitalization and antibiotic therapy; peritonitis for spillage of dermoid contents; injury to bowel, bladder, ureters and major vessels or other surrounding organs; need for additional procedures; thromboembolic phenomenon, incisional problems and other postoperative or anesthesia complications including adhesive disease that may need further management.  Patient was told that the likelihood that her condition and symptoms will be treated effectively with this surgical management was very high; the postoperative expectations were also discussed in detail.  All questions were answered.  The patient concurred with the proposed plan, giving informed written consent for the surgery.  Patient has been NPO since last night she will remain NPO for procedure.  Anesthesia and OR aware.  Preoperative prophylactic antibiotics and SCDs ordered on call to the OR.  To OR when ready.   Verita Schneiders, M.D. 05/31/2015 10:58 AM

## 2015-05-31 NOTE — Anesthesia Procedure Notes (Signed)
Procedure Name: Intubation Date/Time: 05/31/2015 11:20 AM Performed by: Jonna Munro Pre-anesthesia Checklist: Patient identified, Patient being monitored, Timeout performed, Emergency Drugs available and Suction available Patient Re-evaluated:Patient Re-evaluated prior to inductionOxygen Delivery Method: Circle system utilized Preoxygenation: Pre-oxygenation with 100% oxygen Intubation Type: IV induction Ventilation: Mask ventilation without difficulty Laryngoscope Size: Miller and 2 Grade View: Grade I Tube type: Oral Number of attempts: 1 Airway Equipment and Method: Stylet Placement Confirmation: ETT inserted through vocal cords under direct vision,  positive ETCO2,  CO2 detector and breath sounds checked- equal and bilateral Secured at: 22 cm Tube secured with: Tape Dental Injury: Teeth and Oropharynx as per pre-operative assessment

## 2015-05-31 NOTE — Discharge Instructions (Signed)
Laparoscopic Surgery - Care After Laparoscopy is a surgical procedure. It is used to diagnose and treat diseases inside the belly(abdomen). It is usually a brief, common, and relatively simple procedure. The laparoscopeis a thin, lighted, pencil-sized instrument. It is like a telescope. It is inserted into your abdomen through a small cut (incision). Your caregiver can look at the organs inside your body through this instrument.  She can see if there is anything abnormal. Laparoscopy can be done either in a hospital or outpatient clinic. You may be given a mild sedative to help you relax before the procedure. Once in the operating room, you will be given a drug to make you sleep (general anesthesia). Laparoscopy usually lasts about 1-2  hours. After the procedure, you will be monitored in a recovery area until you are stable and doing well. Once you are home, it may take up to 7-14 days to fully recover.  Laparoscopy has relatively few risks. Your caregiver will discuss the risks with you before the procedure. Some problems that can occur include:  RISKS AND COMPLICATIONS   Allergies to medicines.  Difficulty breathing.  Bleeding.  Infection.  Damage to other surrounding structures HOME CARE INSTRUCTIONS   Infection.  Bleeding.  Damage to other organs.  Anesthetic side effects.   Need for additional procedures such as open procedures/laparotomy PROCEDURE Once you receive anesthesia, your surgeon inflates the abdomen with a harmless gas (carbon dioxide). This makes the organs easier to see. The laparoscope is inserted into the abdomen through a small incision. This allows your surgeon to see into the abdomen. Other small instruments are also inserted into the abdomen through other small openings. Many surgeons attach a video camera to the laparoscope to enlarge the view. During a laparoscopy, the surgeon may be looking for inflammation, infection, or cancer.  The surgeon may also need to  take out certain organs or take tissue samples (biopsies). The specimens are sent to a specialist in looking at cells and tissue samples (pathologist). The pathologist examines them under a microscope to help to diagnose or confirm a disease. AFTER THE PROCEDURE   The incisions are closed with stitches (sutures) and Dermabond. Because these incisions are small, there is usually minimal discomfort after the procedure. There may also be discomfort from the instrument placement incisions in the abdomen. You will be given pain medicine to ease any discomfort.  You will rest in a recovery room for 1-2 hours until you are stable and doing well.  You may have some mild discomfort in the throat. This is from the tube placed in your throat while you were sleeping.  You may experience discomfort in the shoulder area from some trapped air between the liver and diaphragm. This sensation is normal and will slowly go away on its own.  The recovery time is shortened as long as there are no complications.  You will rest in a recovery room until stable and doing well. As long as there are no complications, you may be allowed to go home. Someone will need to drive you home and be with you for at least 24 hours once home. FINDING OUT THE RESULTS You will be called with the results of the pathology and will discuss these results with  your caregiver during your postoperative appointment. Do not assume everything is normal if you have not heard from your caregiver or the medical facility. It is important for you to follow up on all of your results. HOME CARE INSTRUCTIONS  Take all medicines as directed.  Only take over-the-counter or prescription medicines for pain, discomfort, or fever as directed by your caregiver.  Resume daily activities as directed.  Showers are preferred over baths.  You may resume sexual activities in 1 week or as directed.  Do not drive while taking narcotics. SEEK MEDICAL CARE IF:    There is increasing abdominal pain.  You feel lightheaded or faint.  You have the chills.  You have an oral temperature above 102 F (38.9 C).  There is pus-like (purulent) drainage from any of the wounds.  You are unable to pass gas or have a bowel movement.  You feel sick to your stomach (nauseous) or throw up (vomit). MAKE SURE YOU:   Understand these instructions.  Will watch your condition.  Will get help right away if you are not doing well or get worse.  ExitCare Patient Information 2013 [place].

## 2015-05-31 NOTE — Transfer of Care (Signed)
Immediate Anesthesia Transfer of Care Note  Patient: Madison Walker  Procedure(s) Performed: Procedure(s): LAPAROSCOPIC UNILATERAL SALPINGO OOPHORECTOMY (Left) UNILATERAL SALPINGECTOMY (Right)  Patient Location: PACU  Anesthesia Type:General  Level of Consciousness: awake  Airway & Oxygen Therapy: Patient Spontanous Breathing  Post-op Assessment: Report given to PACU RN  Post vital signs: stable  Filed Vitals:   05/31/15 1039  BP: 134/83  Pulse: 66  Temp: 36.6 C  Resp: 18    Complications: No apparent anesthesia complications

## 2015-05-31 NOTE — Anesthesia Postprocedure Evaluation (Signed)
Anesthesia Post Note  Patient: Madison Walker  Procedure(s) Performed: Procedure(s) (LRB): LAPAROSCOPIC UNILATERAL SALPINGO OOPHORECTOMY (Left) UNILATERAL SALPINGECTOMY (Right)  Patient location during evaluation: PACU Anesthesia Type: General Level of consciousness: sedated Pain management: pain level controlled Vital Signs Assessment: post-procedure vital signs reviewed and stable Respiratory status: spontaneous breathing and respiratory function stable Cardiovascular status: stable Anesthetic complications: no    Last Vitals:  Filed Vitals:   05/31/15 1330 05/31/15 1345  BP: 120/71 115/68  Pulse: 70 61  Temp:    Resp: 16 16    Last Pain:  Filed Vitals:   05/31/15 1358  PainSc: 0-No pain                 Aubrea Meixner DANIEL

## 2015-06-01 ENCOUNTER — Encounter (HOSPITAL_COMMUNITY): Payer: Self-pay | Admitting: Obstetrics & Gynecology

## 2015-06-02 ENCOUNTER — Telehealth: Payer: Self-pay | Admitting: *Deleted

## 2015-06-02 NOTE — Telephone Encounter (Signed)
Patient called back into front office. I informed her of negative results. Patient verbalized understanding & had no questions

## 2015-06-02 NOTE — Telephone Encounter (Signed)
Per Dr. Harolyn Rutherford call patient and tell her benign results from her pathology.   I called Madison Walker and left a message we are calling with some non-urgent information. Please call clinic. If we may  Leave detailed information on your voicemail, please leave those instructions.

## 2015-06-20 ENCOUNTER — Encounter: Payer: Self-pay | Admitting: Obstetrics & Gynecology

## 2015-06-20 ENCOUNTER — Ambulatory Visit (INDEPENDENT_AMBULATORY_CARE_PROVIDER_SITE_OTHER): Payer: Self-pay | Admitting: Obstetrics & Gynecology

## 2015-06-20 VITALS — BP 133/72 | HR 62 | Wt 181.5 lb

## 2015-06-20 DIAGNOSIS — Z09 Encounter for follow-up examination after completed treatment for conditions other than malignant neoplasm: Secondary | ICD-10-CM

## 2015-06-20 NOTE — Progress Notes (Signed)
    Subjective:     Madison Walker is a 50 y.o. G81P2002 female who presents to the clinic status post laparoscopic left salpingoophorectomy on 05/31/15 for large left ovarian dermoid cyst. Eating a regular diet without difficulty. Bowel movements are normal. The patient is not having any pain.  The following portions of the patient's history were reviewed and updated as appropriate: allergies, current medications, past family history, past medical history, past social history, past surgical history and problem list.  Review of Systems Pertinent items noted in HPI and remainder of comprehensive ROS otherwise negative.    Objective:    BP 133/72 mmHg  Pulse 62  Wt 181 lb 8 oz (82.328 kg)  LMP 09/06/2014 (Approximate) General:  alert and no distress  Abdomen: soft, bowel sounds active, non-tender  Incision:   healing well, no drainage, no erythema, no hernia, no seroma, no swelling, no dehiscence, incision well approximated    Pathology Diagnosis Ovary and fallopian tube, left, and right - FEATURES CONSISTENT WITH DEGENERATIVE DERMOID CYST (MATURE TERATOMA). - BENIGN FALLOPIAN TUBE(S) WITH PARATUBAL CYST. Assessment:    Doing well postoperatively. Operative findings again reviewed. Pathology report discussed.    Plan:   1. Continue any current medications. 2. Wound care discussed. 3. Activity restrictions: none 4. Anticipated return to work: already working. 5. Follow up as needed.  Verita Schneiders, MD, [place] Attending Obstetrician & Gynecologist, [place] for [facility]

## 2015-06-23 ENCOUNTER — Emergency Department (HOSPITAL_COMMUNITY)
Admission: EM | Admit: 2015-06-23 | Discharge: 2015-06-23 | Disposition: A | Discharge status: Home / Self Care | Payer: Self-pay | Attending: Emergency Medicine | Admitting: Emergency Medicine

## 2015-06-23 ENCOUNTER — Encounter (HOSPITAL_COMMUNITY): Payer: Self-pay | Admitting: Emergency Medicine

## 2015-06-23 DIAGNOSIS — J040 Acute laryngitis: Secondary | ICD-10-CM

## 2015-06-23 DIAGNOSIS — J069 Acute upper respiratory infection, unspecified: Secondary | ICD-10-CM | POA: Insufficient documentation

## 2015-06-23 DIAGNOSIS — B349 Viral infection, unspecified: Secondary | ICD-10-CM

## 2015-06-23 DIAGNOSIS — Z791 Long term (current) use of non-steroidal anti-inflammatories (NSAID): Secondary | ICD-10-CM | POA: Insufficient documentation

## 2015-06-23 DIAGNOSIS — Z79891 Long term (current) use of opiate analgesic: Secondary | ICD-10-CM | POA: Insufficient documentation

## 2015-06-23 DIAGNOSIS — Z79899 Other long term (current) drug therapy: Secondary | ICD-10-CM | POA: Insufficient documentation

## 2015-06-23 DIAGNOSIS — Z7982 Long term (current) use of aspirin: Secondary | ICD-10-CM | POA: Insufficient documentation

## 2015-06-23 DIAGNOSIS — Z792 Long term (current) use of antibiotics: Secondary | ICD-10-CM | POA: Insufficient documentation

## 2015-06-23 MED ORDER — IBUPROFEN 800 MG PO TABS: 800.0000 mg | ORAL_TABLET | Freq: Three times a day (TID) | ORAL | Status: DC

## 2015-06-23 MED ORDER — FLUTICASONE PROPIONATE 50 MCG/ACT NA SUSP
2.0000 | Freq: Every day | NASAL | Status: DC
Start: 2015-06-23 — End: 2017-11-22

## 2015-06-23 MED ORDER — BENZONATATE 100 MG PO CAPS: 100.0000 mg | ORAL_CAPSULE | Freq: Three times a day (TID) | ORAL | Status: DC

## 2015-06-23 NOTE — ED Notes (Signed)
Pt states Tuesday she started getting a scratchy throat, cough, and sneezing  Pt states yesterday she woke up hoarse, low grade fever, sore throat, body aches

## 2015-06-23 NOTE — Discharge Instructions (Signed)
Laryngitis  Laryngitis is inflammation of your vocal cords. This causes hoarseness, coughing, loss of voice, sore throat, or a dry throat. Your vocal cords are two bands of muscles that are found in your throat. When you speak, these cords come together and vibrate. These vibrations come out through your mouth as sound. When your vocal cords are inflamed, your voice sounds different.  Laryngitis can be temporary (acute) or long-term (chronic). Most cases of acute laryngitis improve with time. Chronic laryngitis is laryngitis that lasts for more than three weeks.  CAUSES  Acute laryngitis may be caused by:   A viral infection.   Lots of talking, yelling, or singing. This is also called vocal strain.   Bacterial infections.  Chronic laryngitis may be caused by:   Vocal strain.   Injury to your vocal cords.   Acid reflux (gastroesophageal reflux disease or GERD).   Allergies.   Sinus infection.   Smoking.   Alcohol abuse.   Breathing in chemicals or dust.   Growths on the vocal cords.  RISK FACTORS  Risk factors for laryngitis include:   Smoking.   Alcohol abuse.   Having allergies.  SIGNS AND SYMPTOMS  Symptoms of laryngitis may include:   Low, hoarse voice.   Loss of voice.   Dry cough.   Sore throat.   Stuffy nose.  DIAGNOSIS  Laryngitis may be diagnosed by:   Physical exam.   Throat culture.   Blood test.   Laryngoscopy. This procedure allows your health care provider to look at your vocal cords with a mirror or viewing tube.  TREATMENT  Treatment for laryngitis depends on what is causing it. Usually, treatment involves resting your voice and using medicines to soothe your throat. However, if your laryngitis is caused by a bacterial infection, you may need to take antibiotic medicine. If your laryngitis is caused by a growth, you may need to have a procedure to remove it.  HOME CARE INSTRUCTIONS   Drink enough fluid to keep your urine clear or pale yellow.   Breathe in moist air. Use a  humidifier if you live in a dry climate.   Take medicines only as directed by your health care provider.   If you were prescribed an antibiotic medicine, finish it all even if you start to feel better.   Do not smoke cigarettes or electronic cigarettes. If you need help quitting, ask your health care provider.   Talk as little as possible. Also avoid whispering, which can cause vocal strain.   Write instead of talking. Do this until your voice is back to normal.  SEEK MEDICAL CARE IF:   You have a fever.   You have increasing pain.   You have difficulty swallowing.  SEEK IMMEDIATE MEDICAL CARE IF:   You cough up blood.   You have trouble breathing.     This information is not intended to replace advice given to you by your health care provider. Make sure you discuss any questions you have with your health care provider.     Document Released: 01/29/2005 Document Revised: 02/19/2014 Document Reviewed: 07/14/2013  Elsevier Interactive Patient Education 2016 [employer]

## 2015-06-23 NOTE — ED Provider Notes (Signed)
CSN: [id number]     Arrival date & time 06/23/15  1911 History  By signing my name below, I, Eustaquio Maize, attest that this documentation has been prepared under the direction and in the presence of Laronda Lisby, [place]. Electronically Signed: Eustaquio Maize, ED Scribe. 06/23/2015. 8:54 PM.   Chief Complaint  Patient presents with  . URI   The history is provided by the patient. No language interpreter was used.    HPI Comments: Madison Walker is a 50 y.o. female who presents to the Emergency Department complaining of gradual onset, constant, URI like symptoms including a scratchy sore throat that began 2 days ago. Pt also complains of sneezing, cough, nasal congestion, voice hoarseness, nausea, body aches, and a fever with tmax 101. Pt has been taking Xyzal with no relief.  Denis any other associated symptoms.   History reviewed. No pertinent past medical history. Past Surgical History  Procedure Laterality Date  . Tubal ligation    . Laparoscopic unilateral salpingo oopherectomy Left 05/31/2015    Procedure: LAPAROSCOPIC UNILATERAL SALPINGO OOPHORECTOMY;  Surgeon: Osborne Oman, MD;  Location: [facility];  Service: Gynecology;  Laterality: Left;  . Unilateral salpingectomy Right 05/31/2015    Procedure: UNILATERAL SALPINGECTOMY;  Surgeon: Osborne Oman, MD;  Location: [facility];  Service: Gynecology;  Laterality: Right;   Family History  Problem Relation Age of Onset  . Diabetes Other    Social History  Substance Use Topics  . Smoking status: Never Smoker   . Smokeless tobacco: Never Used  . Alcohol Use: No   OB History    Gravida Para Term Preterm AB TAB SAB Ectopic Multiple Living   2 2 2       2      Review of Systems A complete 10 system review of systems was obtained and all systems are negative except as noted in the HPI and PMH.    Allergies  Iodine  Home Medications   Prior to Admission medications   Medication Sig Start Date End Date Taking? Authorizing Provider   acetaminophen (ARTHRITIS PAIN) 650 MG CR tablet Take 1,300 mg by mouth every 8 (eight) hours as needed for pain. Reported on 06/20/2015    Historical Provider, MD  acetaminophen (TYLENOL) 500 MG tablet Take 1,500 mg by mouth every 6 (six) hours as needed for mild pain or moderate pain. Reported on 06/20/2015    Historical Provider, MD  aspirin 325 MG EC tablet Take 325 mg by mouth daily. Reported on 06/20/2015    Historical Provider, MD  azithromycin (ZITHROMAX) 250 MG tablet Take as directed: Two pills by mouth the first day, then one pill every day until completed Patient not taking: Reported on 06/20/2015 05/31/15   Osborne Oman, MD  bisacodyl (DULCOLAX) 10 MG suppository Place 10 mg rectally daily as needed for moderate constipation. Reported on 06/20/2015    Historical Provider, MD  docusate sodium (COLACE) 100 MG capsule Take 1 capsule (100 mg total) by mouth 2 (two) times daily as needed. Patient not taking: Reported on 06/20/2015 05/31/15   Osborne Oman, MD  fexofenadine-pseudoephedrine (ALLEGRA-D 24) 180-240 MG 24 hr tablet Take 1 tablet by mouth daily as needed (for allergies). Reported on 06/20/2015    Historical Provider, MD  ibuprofen (ADVIL,MOTRIN) 800 MG tablet Take 1 tablet (800 mg total) by mouth 3 (three) times daily with meals as needed for headache or moderate pain. Patient not taking: Reported on 06/20/2015 05/31/15   Osborne Oman, MD  oxyCODONE-acetaminophen (  PERCOCET/ROXICET) 5-325 MG tablet Take 1-2 tablets by mouth every 6 (six) hours as needed. Patient not taking: Reported on 06/20/2015 05/31/15   Osborne Oman, MD  oxymetazoline (AFRIN) 0.05 % nasal spray Place 1 spray into both nostrils 2 (two) times daily as needed for congestion. Reported on 06/20/2015    Historical Provider, MD  promethazine (PHENERGAN) 25 MG tablet Take 1 tablet (25 mg total) by mouth every 6 (six) hours as needed for nausea or vomiting. Patient not taking: Reported on 06/20/2015 05/31/15   Osborne Oman, MD    BP 147/82 mmHg  Pulse 97  Temp(Src) 98.3 F (36.8 C) (Oral)  Resp 18  Ht 5\' 8"  (1.727 m)  Wt 181 lb (82.101 kg)  BMI 27.53 kg/m2  SpO2 97%  LMP 09/06/2014 (Approximate)   Physical Exam  Constitutional: She is oriented to person, place, and time. She appears well-developed and well-nourished. No distress.  HENT:  Head: Normocephalic and atraumatic.  Right Ear: Tympanic membrane and external ear normal.  Left Ear: Tympanic membrane and external ear normal.  Nose: Mucosal edema present.  Mouth/Throat: Posterior oropharyngeal erythema present. No oropharyngeal exudate or posterior oropharyngeal edema.  Eyes: Conjunctivae and EOM are normal.  Neck: Neck supple. No tracheal deviation present.  Cardiovascular: Normal rate, regular rhythm and normal heart sounds.   Pulmonary/Chest: Effort normal and breath sounds normal. No respiratory distress. She has no wheezes. She has no rales.  Musculoskeletal: Normal range of motion.  Neurological: She is alert and oriented to person, place, and time.  Skin: Skin is warm and dry.  Psychiatric: She has a normal mood and affect. Her behavior is normal.  Nursing note and vitals reviewed.   ED Course  Procedures (including critical care time)  DIAGNOSTIC STUDIES: Oxygen Saturation is 97% on RA, normal by my interpretation.    COORDINATION OF CARE: 8:51 PM-Discussed treatment plan with pt at bedside and pt agreed to plan.   Labs Review Labs Reviewed - No data to display  Imaging Review No results found.   EKG Interpretation None      MDM   Final diagnoses:  URI (upper respiratory infection)  Laryngitis  Viral syndrome    Suspect viral syndrome. -1 Centor criteria. Will hold off on rapid strep. Encouraged supportive therapies. Rx given for supportive meds. Pt is afebrile without hypoxia and nontoxic appearing. ER return precautions given.  I personally performed the services described in this documentation, which was scribed  in my presence. The recorded information has been reviewed and is accurate.     Anne Ng, PA-C 06/24/15 [id number]  Carmin Muskrat, MD 06/25/15 2216

## 2015-09-15 ENCOUNTER — Emergency Department (HOSPITAL_COMMUNITY)
Admission: EM | Admit: 2015-09-15 | Discharge: 2015-09-16 | Disposition: A | Discharge status: Home / Self Care | Payer: Self-pay | Attending: Emergency Medicine | Admitting: Emergency Medicine

## 2015-09-15 ENCOUNTER — Encounter (HOSPITAL_COMMUNITY): Payer: Self-pay | Admitting: Emergency Medicine

## 2015-09-15 DIAGNOSIS — K5900 Constipation, unspecified: Secondary | ICD-10-CM | POA: Insufficient documentation

## 2015-09-15 DIAGNOSIS — R300 Dysuria: Secondary | ICD-10-CM | POA: Insufficient documentation

## 2015-09-15 DIAGNOSIS — M549 Dorsalgia, unspecified: Secondary | ICD-10-CM | POA: Insufficient documentation

## 2015-09-15 DIAGNOSIS — Z7982 Long term (current) use of aspirin: Secondary | ICD-10-CM | POA: Insufficient documentation

## 2015-09-15 DIAGNOSIS — G8918 Other acute postprocedural pain: Secondary | ICD-10-CM | POA: Insufficient documentation

## 2015-09-15 DIAGNOSIS — R1032 Left lower quadrant pain: Secondary | ICD-10-CM

## 2015-09-15 LAB — CBC
HEMATOCRIT: 36.3 % (ref 36.0–46.0)
Hemoglobin: 12.2 g/dL (ref 12.0–15.0)
MCH: 32.6 pg (ref 26.0–34.0)
MCHC: 33.6 g/dL (ref 30.0–36.0)
MCV: 97.1 fL (ref 78.0–100.0)
PLATELETS: 200 10*3/uL (ref 150–400)
RBC: 3.74 MIL/uL — ABNORMAL LOW (ref 3.87–5.11)
RDW: 12.9 % (ref 11.5–15.5)
WBC: 6.7 10*3/uL (ref 4.0–10.5)

## 2015-09-15 LAB — URINALYSIS, ROUTINE W REFLEX MICROSCOPIC
BILIRUBIN URINE: NEGATIVE
Glucose, UA: NEGATIVE mg/dL
Hgb urine dipstick: NEGATIVE
KETONES UR: NEGATIVE mg/dL
LEUKOCYTES UA: NEGATIVE
NITRITE: NEGATIVE
PH: 5.5 (ref 5.0–8.0)
PROTEIN: NEGATIVE mg/dL
Specific Gravity, Urine: 1.022 (ref 1.005–1.030)

## 2015-09-15 LAB — COMPREHENSIVE METABOLIC PANEL
ALBUMIN: 4.8 g/dL (ref 3.5–5.0)
ALT: 15 U/L (ref 14–54)
ANION GAP: 7 (ref 5–15)
AST: 18 U/L (ref 15–41)
Alkaline Phosphatase: 80 U/L (ref 38–126)
BUN: 17 mg/dL (ref 6–20)
CALCIUM: 9.5 mg/dL (ref 8.9–10.3)
CO2: 27 mmol/L (ref 22–32)
CREATININE: 0.69 mg/dL (ref 0.44–1.00)
Chloride: 106 mmol/L (ref 101–111)
GFR calc Af Amer: >60 mL/min (ref >60)
GFR calc non Af Amer: >60 mL/min (ref >60)
GLUCOSE: 96 mg/dL (ref 65–99)
POTASSIUM: 3.7 mmol/L (ref 3.5–5.1)
SODIUM: 140 mmol/L (ref 135–145)
TOTAL PROTEIN: 7.7 g/dL (ref 6.5–8.1)
Total Bilirubin: 0.8 mg/dL (ref 0.3–1.2)

## 2015-09-15 LAB — LIPASE, BLOOD: LIPASE: 30 U/L (ref 11–51)

## 2015-09-15 NOTE — ED Triage Notes (Signed)
Patient presents for LUQ abdominal pain. Reports left ovary was removed in April 2017 and approximately 1-2 weeks ago abdominal pain started. Denies urinary symptoms, N/V/D.

## 2015-09-15 NOTE — ED Notes (Signed)
Pt eating and drinking in the lobby

## 2015-09-16 NOTE — ED Provider Notes (Signed)
[facility] DEPT Provider Note   CSN: [id number] Arrival date & time: 09/15/15  1841  By signing my name below, I, Madison Walker, attest that this documentation has been prepared under the direction and in the presence of Charlann Lange, PA-C. Electronically Signed: Gwenlyn Walker, ED Scribe. 09/16/15. 12:47 AM.  First MD Initiated Contact with Patient 09/16/15 0030    History   Chief Complaint Chief Complaint  Patient presents with  . Abdominal Pain   The history is provided by the patient. No language interpreter was used.    HPI Comments: Madison Walker is a 50 y.o. female who presents to the Emergency Department complaining of gradual onset, waxing and waning LLQ abdominal pain onset a month. Pt has PSHx of left unilateral Salpingo Oophorectomy on 05/31/15. Pt states she started to have pain close to the surgical area. She described it as an aching pain that only really bothers her when she palpates it or when something brushes against it. She states pain in not exacerbated when walking or when getting up from a supine position. Pt states she has also had a back ache recently and had 1 episode of a burning sensation while urinating. Pt states she has a baseline problem with constipation. She states she had a normal bowel movement today and yesterday but needed to strain. She has not spoken with her surgeon about her abdominal pain. Pt denies any other urinary symptoms, abnormal vaginal bleeding or discharge, nausea, vomiting, diarrhea, blood in stool.  History reviewed. No pertinent past medical history.  Patient Active Problem List   Diagnosis Date Noted  . Dermoid cyst of ovary (8 cm) 04/07/2015    Past Surgical History:  Procedure Laterality Date  . LAPAROSCOPIC UNILATERAL SALPINGO OOPHERECTOMY Left 05/31/2015   Procedure: LAPAROSCOPIC UNILATERAL SALPINGO OOPHORECTOMY;  Surgeon: Osborne Oman, MD;  Location: [facility];  Service: Gynecology;  Laterality: Left;  . TUBAL LIGATION    .  UNILATERAL SALPINGECTOMY Right 05/31/2015   Procedure: UNILATERAL SALPINGECTOMY;  Surgeon: Osborne Oman, MD;  Location: [facility];  Service: Gynecology;  Laterality: Right;    OB History    Gravida Para Term Preterm AB Living   2 2 2     2    SAB TAB Ectopic Multiple Live Births                   Home Medications    Prior to Admission medications   Medication Sig Start Date End Date Taking? Authorizing Provider  acetaminophen (ARTHRITIS PAIN) 650 MG CR tablet Take 1,300 mg by mouth every 8 (eight) hours as needed for pain. Reported on 06/20/2015    Historical Provider, MD  acetaminophen (TYLENOL) 500 MG tablet Take 1,500 mg by mouth every 6 (six) hours as needed for mild pain or moderate pain. Reported on 06/20/2015    Historical Provider, MD  aspirin 325 MG EC tablet Take 325 mg by mouth daily. Reported on 06/20/2015    Historical Provider, MD  azithromycin (ZITHROMAX) 250 MG tablet Take as directed: Two pills by mouth the first day, then one pill every day until completed Patient not taking: Reported on 06/20/2015 05/31/15   Osborne Oman, MD  benzonatate (TESSALON) 100 MG capsule Take 1 capsule (100 mg total) by mouth every 8 (eight) hours. 06/23/15   Olivia Canter Sam, PA-C  bisacodyl (DULCOLAX) 10 MG suppository Place 10 mg rectally daily as needed for moderate constipation. Reported on 06/20/2015    Historical Provider, MD  docusate sodium (COLACE)  100 MG capsule Take 1 capsule (100 mg total) by mouth 2 (two) times daily as needed. Patient not taking: Reported on 06/20/2015 05/31/15   Osborne Oman, MD  fexofenadine-pseudoephedrine (ALLEGRA-D 24) 180-240 MG 24 hr tablet Take 1 tablet by mouth daily as needed (for allergies). Reported on 06/20/2015    Historical Provider, MD  fluticasone (FLONASE) 50 MCG/ACT nasal spray Place 2 sprays into both nostrils daily. 06/23/15   Olivia Canter Sam, PA-C  ibuprofen (ADVIL,MOTRIN) 800 MG tablet Take 1 tablet (800 mg total) by mouth 3 (three) times daily with meals as  needed for headache or moderate pain. Patient not taking: Reported on 06/20/2015 05/31/15   Osborne Oman, MD  ibuprofen (ADVIL,MOTRIN) 800 MG tablet Take 1 tablet (800 mg total) by mouth 3 (three) times daily. 06/23/15   Anne Ng, PA-C  oxyCODONE-acetaminophen (PERCOCET/ROXICET) 5-325 MG tablet Take 1-2 tablets by mouth every 6 (six) hours as needed. Patient not taking: Reported on 06/20/2015 05/31/15   Osborne Oman, MD  oxymetazoline (AFRIN) 0.05 % nasal spray Place 1 spray into both nostrils 2 (two) times daily as needed for congestion. Reported on 06/20/2015    Historical Provider, MD  promethazine (PHENERGAN) 25 MG tablet Take 1 tablet (25 mg total) by mouth every 6 (six) hours as needed for nausea or vomiting. Patient not taking: Reported on 06/20/2015 05/31/15   Osborne Oman, MD    Family History Family History  Problem Relation Age of Onset  . Diabetes Other     Social History Social History  Substance Use Topics  . Smoking status: Never Smoker  . Smokeless tobacco: Never Used  . Alcohol use No    Allergies   Iodine  Review of Systems Review of Systems  Constitutional: Negative for fever.  Gastrointestinal: Positive for abdominal pain and constipation. Negative for blood in stool, diarrhea, nausea and vomiting.  Genitourinary: Positive for dysuria. Negative for decreased urine volume, difficulty urinating, frequency, hematuria, urgency, vaginal bleeding and vaginal discharge.  Musculoskeletal: Positive for back pain.    Physical Exam Updated Vital Signs BP 128/76 (BP Location: Left Arm)   Pulse (!) 59   Temp 97.8 F (36.6 C) (Oral)   Resp 18   Ht 5\' 8"  (1.727 m)   Wt 180 lb (81.6 kg)   SpO2 99%   BMI 27.37 kg/m   Physical Exam  Constitutional: She appears well-developed and well-nourished.  HENT:  Head: Normocephalic.  Eyes: Conjunctivae are normal.  Cardiovascular: Normal rate.   Pulmonary/Chest: Effort normal. No respiratory distress.  Abdominal:  She exhibits no distension.  Abdomen is soft, non-distended, bowel sounds active No hernia appreciated Small 2-3 cm well healed surgical incision in the left lower abdomen that is non tender Minimal tenderness medial to her surgical scar without mass  Musculoskeletal: Normal range of motion.  There is no midline or paralumbar  tenderness  No swelling or redness  Has FROM No CVA tenderness  Neurological: She is alert.  Skin: Skin is warm and dry.  Psychiatric: She has a normal mood and affect. Her behavior is normal.  Nursing note and vitals reviewed.   ED Treatments / Results  DIAGNOSTIC STUDIES: Oxygen Saturation is 99% on RA, normal by my interpretation.    Labs (all labs ordered are listed, but only abnormal results are displayed) Labs Reviewed  CBC - Abnormal; Notable for the following:       Result Value   RBC 3.74 (*)    All other components  within normal limits  LIPASE, BLOOD  COMPREHENSIVE METABOLIC PANEL  URINALYSIS, ROUTINE W REFLEX MICROSCOPIC (NOT AT [facility])   Results for orders placed or performed during the hospital encounter of 09/15/15  Lipase, blood  Result Value Ref Range   Lipase 30 11 - 51 U/L  Comprehensive metabolic panel  Result Value Ref Range   Sodium 140 135 - 145 mmol/L   Potassium 3.7 3.5 - 5.1 mmol/L   Chloride 106 101 - 111 mmol/L   CO2 27 22 - 32 mmol/L   Glucose, Bld 96 65 - 99 mg/dL   BUN 17 6 - 20 mg/dL   Creatinine, Ser 0.69 0.44 - 1.00 mg/dL   Calcium 9.5 8.9 - 10.3 mg/dL   Total Protein 7.7 6.5 - 8.1 g/dL   Albumin 4.8 3.5 - 5.0 g/dL   AST 18 15 - 41 U/L   ALT 15 14 - 54 U/L   Alkaline Phosphatase 80 38 - 126 U/L   Total Bilirubin 0.8 0.3 - 1.2 mg/dL   GFR calc non Af Amer >60 >60 mL/min   GFR calc Af Amer >60 >60 mL/min   Anion gap 7 5 - 15  CBC  Result Value Ref Range   WBC 6.7 4.0 - 10.5 K/uL   RBC 3.74 (L) 3.87 - 5.11 MIL/uL   Hemoglobin 12.2 12.0 - 15.0 g/dL   HCT 36.3 36.0 - 46.0 %   MCV 97.1 78.0 - 100.0 fL   MCH  32.6 26.0 - 34.0 pg   MCHC 33.6 30.0 - 36.0 g/dL   RDW 12.9 11.5 - 15.5 %   Platelets 200 150 - 400 K/uL  Urinalysis, Routine w reflex microscopic  Result Value Ref Range   Color, Urine YELLOW YELLOW   APPearance CLEAR CLEAR   Specific Gravity, Urine 1.022 1.005 - 1.030   pH 5.5 5.0 - 8.0   Glucose, UA NEGATIVE NEGATIVE mg/dL   Hgb urine dipstick NEGATIVE NEGATIVE   Bilirubin Urine NEGATIVE NEGATIVE   Ketones, ur NEGATIVE NEGATIVE mg/dL   Protein, ur NEGATIVE NEGATIVE mg/dL   Nitrite NEGATIVE NEGATIVE   Leukocytes, UA NEGATIVE NEGATIVE    EKG  EKG Interpretation None       Radiology No results found.  Procedures Procedures (including critical care time)  Medications Ordered in ED Medications - No data to display   Initial Impression / Assessment and Plan / ED Course  I have reviewed the triage vital signs and the nursing notes.  Pertinent labs & imaging results that were available during my care of the patient were reviewed by me and considered in my medical decision making (see chart for details).  Clinical Course   Patient presents with one month of abdominal discomfort near recent left oophorectomy (05/2015). She reports the discomfort is only present with deep palpation to the area. No vaginal discharge, dysuria, change with bowel movement, fever. Movement does not make it worse. No fever. Patient appears very comfortable with minimal tenderness. Symptoms felt likely secondary to recent surgery without concern for infection.  Final Clinical Impressions(s) / ED Diagnoses   Final diagnoses:  None  post surgical pain  New Prescriptions New Prescriptions   No medications on file    I personally performed the services described in this documentation, which was scribed in my presence. The recorded information has been reviewed and is accurate.    Charlann Lange, PA-C 09/16/15 [id number]    Varney Biles, MD 09/17/15 [phone]

## 2015-09-16 NOTE — Discharge Instructions (Signed)
YOUR SYMPTOMS OF MILD LEFT LOWER ABDOMINAL DISCOMFORT ARE FELT LIKELY POST-SURGICAL SORENESS. MAKE AN APPOINTMENT WITH DR. Harolyn Rutherford FOR FURTHER EVALUATION IF PAIN CONTINUES. RETURN TO THE EMERGENCY DEPARTMENT WITH ANY FEVER, SEVERE PAIN OR NEW SYMPTOM OF CONCERN.

## 2015-10-12 ENCOUNTER — Emergency Department (HOSPITAL_COMMUNITY)
Admission: EM | Admit: 2015-10-12 | Discharge: 2015-10-12 | Disposition: A | Discharge status: Home / Self Care | Payer: Self-pay | Attending: Emergency Medicine | Admitting: Emergency Medicine

## 2015-10-12 ENCOUNTER — Emergency Department (HOSPITAL_COMMUNITY): Payer: Self-pay

## 2015-10-12 ENCOUNTER — Encounter (HOSPITAL_COMMUNITY): Payer: Self-pay | Admitting: Emergency Medicine

## 2015-10-12 DIAGNOSIS — M79602 Pain in left arm: Secondary | ICD-10-CM

## 2015-10-12 DIAGNOSIS — I4510 Unspecified right bundle-branch block: Secondary | ICD-10-CM | POA: Insufficient documentation

## 2015-10-12 DIAGNOSIS — Z7982 Long term (current) use of aspirin: Secondary | ICD-10-CM | POA: Insufficient documentation

## 2015-10-12 DIAGNOSIS — M79622 Pain in left upper arm: Secondary | ICD-10-CM | POA: Insufficient documentation

## 2015-10-12 LAB — CBC
HEMATOCRIT: 36.8 % (ref 36.0–46.0)
Hemoglobin: 12 g/dL (ref 12.0–15.0)
MCH: 32 pg (ref 26.0–34.0)
MCHC: 32.6 g/dL (ref 30.0–36.0)
MCV: 98.1 fL (ref 78.0–100.0)
Platelets: 194 10*3/uL (ref 150–400)
RBC: 3.75 MIL/uL — ABNORMAL LOW (ref 3.87–5.11)
RDW: 12.4 % (ref 11.5–15.5)
WBC: 5.7 10*3/uL (ref 4.0–10.5)

## 2015-10-12 LAB — BASIC METABOLIC PANEL
Anion gap: 8 (ref 5–15)
BUN: 11 mg/dL (ref 6–20)
CO2: 25 mmol/L (ref 22–32)
Calcium: 9.7 mg/dL (ref 8.9–10.3)
Chloride: 104 mmol/L (ref 101–111)
Creatinine, Ser: 0.79 mg/dL (ref 0.44–1.00)
GFR calc Af Amer: >60 mL/min (ref >60)
GLUCOSE: 101 mg/dL — AB (ref 65–99)
POTASSIUM: 3.4 mmol/L — AB (ref 3.5–5.1)
Sodium: 137 mmol/L (ref 135–145)

## 2015-10-12 LAB — I-STAT TROPONIN, ED: Troponin i, poc: 0 ng/mL (ref 0.00–0.08)

## 2015-10-12 NOTE — ED Triage Notes (Signed)
Patient with off and on chest pain and left arm pain for a week and a half.  Patient states she has been having some lightheadedness with the pain off and on.  No shortness of breath, nausea or vomiting at this time.

## 2015-10-12 NOTE — Discharge Instructions (Signed)
Call cardiology for outpatient appointment.  Return to ER with chest pain, shortness of breath, or other new or worsening symptoms.

## 2015-10-12 NOTE — ED Notes (Signed)
EDP at bedside  

## 2015-10-12 NOTE — ED Provider Notes (Signed)
[facility] DEPT Provider Note   CSN: [id number] Arrival date & time: 10/12/15  2042   History   Chief Complaint Chief Complaint  Patient presents with  . Chest Pain  . Arm Pain    HPI Madison Walker is a 50 y.o. female.  The history is provided by the patient.   50 year old female with no significant past medical history presenting with chest pain. Onset about 2 weeks ago. Waxing and waning since then. This episode has been present all of today, but is starting to resolve now. Located in left chest. Radiating to left upper arm. Described as an ache. Mild to moderate severity. Unclear trigger, not clearly exacerbated by exertion, deep breathing, palpation, movement, or eating. No associated shortness of breath, cough, fevers, leg swelling, diaphoresis, nausea or vomiting. Patient is concerned because she has a boyfriend who had left arm pain and it turned out to be an MI a few weeks ago. No significant family history of cardiac disease. No history of hypertension, hyperlipidemia, diabetes, tobacco abuse. No history of DVT/PE.   History reviewed. No pertinent past medical history.  Patient Active Problem List   Diagnosis Date Noted  . Dermoid cyst of ovary (8 cm) 04/07/2015    Past Surgical History:  Procedure Laterality Date  . LAPAROSCOPIC UNILATERAL SALPINGO OOPHERECTOMY Left 05/31/2015   Procedure: LAPAROSCOPIC UNILATERAL SALPINGO OOPHORECTOMY;  Surgeon: Osborne Oman, MD;  Location: [facility];  Service: Gynecology;  Laterality: Left;  . TUBAL LIGATION    . UNILATERAL SALPINGECTOMY Right 05/31/2015   Procedure: UNILATERAL SALPINGECTOMY;  Surgeon: Osborne Oman, MD;  Location: [facility];  Service: Gynecology;  Laterality: Right;    OB History    Gravida Para Term Preterm AB Living   2 2 2     2    SAB TAB Ectopic Multiple Live Births                   Home Medications    Prior to Admission medications   Medication Sig Start Date End Date Taking? Authorizing Provider   aspirin EC 81 MG tablet Take 81 mg by mouth 3 (three) times a week.    Yes Historical Provider, MD  GARLIC OIL PO Take 2 tablets by mouth 3 (three) times a week.   Yes Historical Provider, MD  Misc Natural Products (APPLE CIDER VINEGAR) TABS Take 2 tablets by mouth 3 (three) times a week.   Yes Historical Provider, MD  azithromycin (ZITHROMAX) 250 MG tablet Take as directed: Two pills by mouth the first day, then one pill every day until completed Patient not taking: Reported on 06/20/2015 05/31/15   Osborne Oman, MD  benzonatate (TESSALON) 100 MG capsule Take 1 capsule (100 mg total) by mouth every 8 (eight) hours. Patient not taking: Reported on 09/16/2015 06/23/15   Olivia Canter Sam, PA-C  docusate sodium (COLACE) 100 MG capsule Take 1 capsule (100 mg total) by mouth 2 (two) times daily as needed. Patient not taking: Reported on 06/20/2015 05/31/15   Osborne Oman, MD  fluticasone (FLONASE) 50 MCG/ACT nasal spray Place 2 sprays into both nostrils daily. Patient not taking: Reported on 09/16/2015 06/23/15   Olivia Canter Sam, PA-C  ibuprofen (ADVIL,MOTRIN) 800 MG tablet Take 1 tablet (800 mg total) by mouth 3 (three) times daily with meals as needed for headache or moderate pain. Patient not taking: Reported on 06/20/2015 05/31/15   Osborne Oman, MD  ibuprofen (ADVIL,MOTRIN) 800 MG tablet Take 1 tablet (800 mg total)  by mouth 3 (three) times daily. Patient not taking: Reported on 09/16/2015 06/23/15   Olivia Canter Sam, PA-C  oxyCODONE-acetaminophen (PERCOCET/ROXICET) 5-325 MG tablet Take 1-2 tablets by mouth every 6 (six) hours as needed. Patient not taking: Reported on 06/20/2015 05/31/15   Osborne Oman, MD  promethazine (PHENERGAN) 25 MG tablet Take 1 tablet (25 mg total) by mouth every 6 (six) hours as needed for nausea or vomiting. Patient not taking: Reported on 06/20/2015 05/31/15   Osborne Oman, MD    Family History Family History  Problem Relation Age of Onset  . Diabetes Other     Social  History Social History  Substance Use Topics  . Smoking status: Never Smoker  . Smokeless tobacco: Never Used  . Alcohol use No     Allergies   Iodine   Review of Systems Review of Systems  Constitutional: Negative for diaphoresis and fever.  Respiratory: Negative for cough and shortness of breath.   Cardiovascular: Positive for chest pain. Negative for palpitations and leg swelling.  Gastrointestinal: Negative for abdominal pain, nausea and vomiting.  Musculoskeletal: Positive for myalgias.  Skin: Negative for rash.  Neurological: Negative for weakness, light-headedness and numbness.  Hematological: Does not bruise/bleed easily.  All other systems reviewed and are negative.    Physical Exam Updated Vital Signs BP 125/82   Pulse (!) 53   Temp 98.2 F (36.8 C) (Oral)   Resp 12   Ht 5\' 8"  (1.727 m)   Wt 81.6 kg   SpO2 100%   BMI 27.37 kg/m   Physical Exam  Constitutional: She appears well-developed and well-nourished. No distress.  HENT:  Head: Normocephalic and atraumatic.  Eyes: Conjunctivae are normal.  Neck: Neck supple.  Cardiovascular: Normal rate, regular rhythm and intact distal pulses.   Murmur heard. Pulmonary/Chest: Effort normal and breath sounds normal. No respiratory distress.  Abdominal: Soft. There is no tenderness.  Musculoskeletal: She exhibits no edema.  Neurological: She is alert. She has normal strength. No sensory deficit.  Skin: Skin is warm and dry. She is not diaphoretic.  Psychiatric: She has a normal mood and affect.  Nursing note and vitals reviewed.   ED Treatments / Results  Labs (all labs ordered are listed, but only abnormal results are displayed) Labs Reviewed  BASIC METABOLIC PANEL - Abnormal; Notable for the following:       Result Value   Potassium 3.4 (*)    Glucose, Bld 101 (*)    All other components within normal limits  CBC - Abnormal; Notable for the following:    RBC 3.75 (*)    All other components within  normal limits  I-STAT TROPOININ, ED    EKG  EKG Interpretation  Date/Time:  Wednesday October 12 2015 20:50:03 EDT Ventricular Rate:  68 PR Interval:  226 QRS Duration: 128 QT Interval:  420 QTC Calculation: 446 R Axis:   97 Text Interpretation:  Sinus rhythm with 1st degree A-V block Right bundle branch block Abnormal ECG Confirmed by Jeneen Rinks  MD, [place] ([id number]) on 10/12/2015 10:35:18 PM Also confirmed by Jeneen Rinks  MD, [place] ([id number])  on 10/12/2015 10:48:17 PM       Radiology Dg Chest 2 View  Result Date: 10/12/2015 CLINICAL DATA:  Chest pain yesterday EXAM: CHEST  2 VIEW COMPARISON:  None. FINDINGS: Normal heart size and pulmonary vascularity. Interstitial changes in peribronchial thickening centrally suggesting airways disease. No focal airspace disease or consolidation in the lungs. No blunting of costophrenic angles. No pneumothorax. Mediastinal  contours appear intact. IMPRESSION: Peribronchial thickening with central interstitial changes suggesting airways disease. No focal consolidation. Electronically Signed   By: Lucienne Capers M.D.   On: 10/12/2015 21:21    Procedures Procedures (including critical care time)  Medications Ordered in ED Medications - No data to display   Initial Impression / Assessment and Plan / ED Course  I have reviewed the triage vital signs and the nursing notes.  Pertinent labs & imaging results that were available during my care of the patient were reviewed by me and considered in my medical decision making (see chart for details).  Clinical Course    50 year old female with no significant past medical history presenting with chest pain and left arm aching as above. AF, VSS. Well appearing with minimal symptoms currently. EKG with RBBB, no priors for comparison. On exam does have faint systolic murmur. She does not have a PCP. Given new finding of right bundle branch block and systolic murmur, feel cardiology follow-up would be appropriate, as well as  establishing a PCP for primary care. However, acutely, patient without acute pulmonary process, negative troponin after weeks and a full day of symptoms, low risk for ACS. Low risk for DVT/PE per [place]. Feel outpatient management is appropriate. Strict return precautions given. Dc in stable condition.   Case discussed with Dr. Jeneen Rinks, who oversaw management of this patient.   Final Clinical Impressions(s) / ED Diagnoses   Final diagnoses:  Pain of left upper extremity  Right bundle branch block (RBBB)    New Prescriptions Discharge Medication List as of 10/12/2015 11:06 PM       Ivin Booty, MD 10/13/15 [id number]    Tanna Furry, MD 10/27/15 [phone]

## 2015-10-12 NOTE — ED Provider Notes (Signed)
Pt seen and evaluated.  D/W Dr. Amedeo Gory.  She has had intermittent pain for a week. She's had pain almost all day today and for the last 4 hours continuously. Has a right bundle branch block on her EKG. No comparison. Normal troponin. She has no other cardiac risk factors her heart score is 2. Anxious appropriate for outpatient follow-up. Given her cardiology for follow-up considering her bundle branch block. Take an aspirin daily. Return to ER with any chest pain or shortness of breath or other symptoms not noted today.   Tanna Furry, MD 10/12/15 [phone]

## 2015-10-16 ENCOUNTER — Emergency Department (HOSPITAL_COMMUNITY)
Admission: EM | Admit: 2015-10-16 | Discharge: 2015-10-16 | Disposition: A | Discharge status: Home / Self Care | Payer: Self-pay | Attending: Emergency Medicine | Admitting: Emergency Medicine

## 2015-10-16 DIAGNOSIS — R110 Nausea: Secondary | ICD-10-CM

## 2015-10-16 DIAGNOSIS — Z7982 Long term (current) use of aspirin: Secondary | ICD-10-CM | POA: Insufficient documentation

## 2015-10-16 DIAGNOSIS — R1033 Periumbilical pain: Secondary | ICD-10-CM

## 2015-10-16 DIAGNOSIS — Z79899 Other long term (current) drug therapy: Secondary | ICD-10-CM | POA: Insufficient documentation

## 2015-10-16 LAB — COMPREHENSIVE METABOLIC PANEL
ALBUMIN: 4.6 g/dL (ref 3.5–5.0)
ALK PHOS: 78 U/L (ref 38–126)
ALT: 22 U/L (ref 14–54)
AST: 24 U/L (ref 15–41)
Anion gap: 3 — ABNORMAL LOW (ref 5–15)
BILIRUBIN TOTAL: 0.4 mg/dL (ref 0.3–1.2)
BUN: 17 mg/dL (ref 6–20)
CALCIUM: 9.7 mg/dL (ref 8.9–10.3)
CO2: 29 mmol/L (ref 22–32)
CREATININE: 0.78 mg/dL (ref 0.44–1.00)
Chloride: 107 mmol/L (ref 101–111)
GFR calc Af Amer: >60 mL/min (ref >60)
GFR calc non Af Amer: >60 mL/min (ref >60)
GLUCOSE: 125 mg/dL — AB (ref 65–99)
Potassium: 4.2 mmol/L (ref 3.5–5.1)
SODIUM: 139 mmol/L (ref 135–145)
TOTAL PROTEIN: 7.4 g/dL (ref 6.5–8.1)

## 2015-10-16 LAB — LIPASE, BLOOD: Lipase: 28 U/L (ref 11–51)

## 2015-10-16 LAB — URINALYSIS, ROUTINE W REFLEX MICROSCOPIC
BILIRUBIN URINE: NEGATIVE
Glucose, UA: NEGATIVE mg/dL
HGB URINE DIPSTICK: NEGATIVE
KETONES UR: NEGATIVE mg/dL
Leukocytes, UA: NEGATIVE
NITRITE: NEGATIVE
PH: 5.5 (ref 5.0–8.0)
Protein, ur: NEGATIVE mg/dL
SPECIFIC GRAVITY, URINE: 1.019 (ref 1.005–1.030)

## 2015-10-16 LAB — CBC
HCT: 36.1 % (ref 36.0–46.0)
Hemoglobin: 12.2 g/dL (ref 12.0–15.0)
MCH: 31.9 pg (ref 26.0–34.0)
MCHC: 33.8 g/dL (ref 30.0–36.0)
MCV: 94.3 fL (ref 78.0–100.0)
Platelets: 193 10*3/uL (ref 150–400)
RBC: 3.83 MIL/uL — ABNORMAL LOW (ref 3.87–5.11)
RDW: 12.3 % (ref 11.5–15.5)
WBC: 7.6 10*3/uL (ref 4.0–10.5)

## 2015-10-16 NOTE — ED Triage Notes (Signed)
Pt states that she has had bilateral arm pain x approx. 3 weeks and was evaluated for cardiac issues at [facility] but was dx with muscle aches. States she has continued to have R arm pain w/ abdominal pain and N//V/D. Alert and oriented.

## 2015-10-16 NOTE — ED Provider Notes (Signed)
[facility] DEPT Provider Note   CSN: [id number] Arrival date & time: 10/16/15  1715     History   Chief Complaint Chief Complaint  Patient presents with  . Generalized Body Aches  . Abdominal Pain    HPI Madison Walker is a 50 y.o. female.  The history is provided by the patient.  Abdominal Pain   This is a new problem. The current episode started 2 days ago. The problem has not changed since onset.The pain is located in the epigastric region. Quality: "feel full" Associated symptoms include diarrhea, nausea and myalgias. Pertinent negatives include anorexia, fever, flatus, melena, vomiting, constipation and headaches. The symptoms are aggravated by eating. The symptoms are relieved by bowel activity.    No past medical history on file.  Patient Active Problem List   Diagnosis Date Noted  . Dermoid cyst of ovary (8 cm) 04/07/2015    Past Surgical History:  Procedure Laterality Date  . LAPAROSCOPIC UNILATERAL SALPINGO OOPHERECTOMY Left 05/31/2015   Procedure: LAPAROSCOPIC UNILATERAL SALPINGO OOPHORECTOMY;  Surgeon: Osborne Oman, MD;  Location: [facility];  Service: Gynecology;  Laterality: Left;  . TUBAL LIGATION    . UNILATERAL SALPINGECTOMY Right 05/31/2015   Procedure: UNILATERAL SALPINGECTOMY;  Surgeon: Osborne Oman, MD;  Location: [facility];  Service: Gynecology;  Laterality: Right;    OB History    Gravida Para Term Preterm AB Living   2 2 2     2    SAB TAB Ectopic Multiple Live Births                   Home Medications    Prior to Admission medications   Medication Sig Start Date End Date Taking? Authorizing Provider  aspirin EC 81 MG tablet Take 81 mg by mouth 3 (three) times a week.    Yes Historical Provider, MD  GARLIC OIL PO Take 2 tablets by mouth 3 (three) times a week.   Yes Historical Provider, MD  Misc Natural Products (APPLE CIDER VINEGAR) TABS Take 2 tablets by mouth 3 (three) times a week.   Yes Historical Provider, MD  Simethicone (GAS-X  PO) Take 1-2 tablets by mouth daily as needed (gas).   Yes Historical Provider, MD  azithromycin (ZITHROMAX) 250 MG tablet Take as directed: Two pills by mouth the first day, then one pill every day until completed Patient not taking: Reported on 06/20/2015 05/31/15   Osborne Oman, MD  benzonatate (TESSALON) 100 MG capsule Take 1 capsule (100 mg total) by mouth every 8 (eight) hours. Patient not taking: Reported on 09/16/2015 06/23/15   Olivia Canter Sam, PA-C  docusate sodium (COLACE) 100 MG capsule Take 1 capsule (100 mg total) by mouth 2 (two) times daily as needed. Patient not taking: Reported on 06/20/2015 05/31/15   Osborne Oman, MD  fluticasone (FLONASE) 50 MCG/ACT nasal spray Place 2 sprays into both nostrils daily. Patient not taking: Reported on 09/16/2015 06/23/15   Olivia Canter Sam, PA-C  ibuprofen (ADVIL,MOTRIN) 800 MG tablet Take 1 tablet (800 mg total) by mouth 3 (three) times daily with meals as needed for headache or moderate pain. Patient not taking: Reported on 06/20/2015 05/31/15   Osborne Oman, MD  ibuprofen (ADVIL,MOTRIN) 800 MG tablet Take 1 tablet (800 mg total) by mouth 3 (three) times daily. Patient not taking: Reported on 09/16/2015 06/23/15   Olivia Canter Sam, PA-C  oxyCODONE-acetaminophen (PERCOCET/ROXICET) 5-325 MG tablet Take 1-2 tablets by mouth every 6 (six) hours as needed. Patient not  taking: Reported on 06/20/2015 05/31/15   Osborne Oman, MD  promethazine (PHENERGAN) 25 MG tablet Take 1 tablet (25 mg total) by mouth every 6 (six) hours as needed for nausea or vomiting. Patient not taking: Reported on 06/20/2015 05/31/15   Osborne Oman, MD    Family History Family History  Problem Relation Age of Onset  . Diabetes Other     Social History Social History  Substance Use Topics  . Smoking status: Never Smoker  . Smokeless tobacco: Never Used  . Alcohol use No     Allergies   Iodine   Review of Systems Review of Systems  Constitutional: Negative for chills,  fatigue and fever.  HENT: Negative for congestion and sore throat.   Eyes: Negative for visual disturbance.  Respiratory: Negative for cough, chest tightness and shortness of breath.   Cardiovascular: Negative for chest pain and palpitations.  Gastrointestinal: Positive for abdominal pain, diarrhea and nausea. Negative for anorexia, blood in stool, constipation, flatus, melena and vomiting.  Genitourinary: Negative for decreased urine volume and difficulty urinating.  Musculoskeletal: Positive for myalgias. Negative for back pain and neck stiffness.  Skin: Negative for rash.  Neurological: Negative for light-headedness and headaches.  Psychiatric/Behavioral: Negative for confusion.  All other systems reviewed and are negative.    Physical Exam Updated Vital Signs BP 139/88 (BP Location: Left Arm)   Pulse 62   Temp 98.3 F (36.8 C) (Oral)   Resp 16   SpO2 100%   Physical Exam  Constitutional: She is oriented to person, place, and time. She appears well-developed and well-nourished. No distress.  HENT:  Head: Normocephalic and atraumatic.  Nose: Nose normal.  Eyes: Conjunctivae and EOM are normal. Pupils are equal, round, and reactive to light. Right eye exhibits no discharge. Left eye exhibits no discharge. No scleral icterus.  Neck: Normal range of motion. Neck supple.  Cardiovascular: Normal rate and regular rhythm.  Exam reveals no gallop and no friction rub.   No murmur heard. Pulmonary/Chest: Effort normal and breath sounds normal. No stridor. No respiratory distress. She has no rales.  Abdominal: Soft. Bowel sounds are normal. She exhibits no distension. There is tenderness (mild discomfort) in the epigastric area and periumbilical area. There is no rigidity, no rebound, no guarding, no CVA tenderness, no tenderness at McBurney's point and negative Murphy's sign.  Musculoskeletal: She exhibits no edema or tenderness.  Neurological: She is alert and oriented to person, place,  and time.  Skin: Skin is warm and dry. No rash noted. She is not diaphoretic. No erythema.  Psychiatric: She has a normal mood and affect.  Vitals reviewed.    ED Treatments / Results  Labs (all labs ordered are listed, but only abnormal results are displayed) Labs Reviewed  COMPREHENSIVE METABOLIC PANEL - Abnormal; Notable for the following:       Result Value   Glucose, Bld 125 (*)    Anion gap 3 (*)    All other components within normal limits  CBC - Abnormal; Notable for the following:    RBC 3.83 (*)    All other components within normal limits  LIPASE, BLOOD  URINALYSIS, ROUTINE W REFLEX MICROSCOPIC (NOT AT [facility])    EKG  EKG Interpretation None       Radiology No results found.  Procedures Procedures (including critical care time)  Medications Ordered in ED Medications - No data to display   Initial Impression / Assessment and Plan / ED Course  I have reviewed  the triage vital signs and the nursing notes.  Pertinent labs & imaging results that were available during my care of the patient were reviewed by me and considered in my medical decision making (see chart for details).  Clinical Course    Non peritonitic. Labs reassuring. Low suspicion for SBO, appendicitis, pancreatitis, cholecystitis, diverticulitis.   Tolerating PO.   Safe for discharge with strict return precautions.    Final Clinical Impressions(s) / ED Diagnoses   Final diagnoses:  Nausea  Periumbilical abdominal pain   Disposition: Discharge  Condition: Good  I have discussed the results, Dx and Tx plan with the patient who expressed understanding and agree(s) with the plan. Discharge instructions discussed at great length. The patient was given strict return precautions who verbalized understanding of the instructions. No further questions at time of discharge.    Current Discharge Medication List      Follow Up: [place] [phone] Call  For help establishing care with a care provider      Fatima Blank, MD 10/17/15 [phone]

## 2015-10-20 ENCOUNTER — Encounter: Payer: Self-pay | Admitting: Obstetrics & Gynecology

## 2016-03-07 ENCOUNTER — Emergency Department (HOSPITAL_COMMUNITY)
Admission: EM | Admit: 2016-03-07 | Discharge: 2016-03-08 | Disposition: A | Discharge status: Home / Self Care | Payer: Self-pay | Attending: Emergency Medicine | Admitting: Emergency Medicine

## 2016-03-07 ENCOUNTER — Encounter (HOSPITAL_COMMUNITY): Payer: Self-pay | Admitting: Emergency Medicine

## 2016-03-07 DIAGNOSIS — R197 Diarrhea, unspecified: Secondary | ICD-10-CM | POA: Insufficient documentation

## 2016-03-07 DIAGNOSIS — Z7982 Long term (current) use of aspirin: Secondary | ICD-10-CM | POA: Insufficient documentation

## 2016-03-07 LAB — URINALYSIS, ROUTINE W REFLEX MICROSCOPIC
BILIRUBIN URINE: NEGATIVE
GLUCOSE, UA: NEGATIVE mg/dL
HGB URINE DIPSTICK: NEGATIVE
KETONES UR: NEGATIVE mg/dL
Leukocytes, UA: NEGATIVE
Nitrite: NEGATIVE
PROTEIN: NEGATIVE mg/dL
Specific Gravity, Urine: 1.018 (ref 1.005–1.030)
pH: 6 (ref 5.0–8.0)

## 2016-03-07 LAB — COMPREHENSIVE METABOLIC PANEL
ALK PHOS: 83 U/L (ref 38–126)
ALT: 18 U/L (ref 14–54)
AST: 20 U/L (ref 15–41)
Albumin: 4.7 g/dL (ref 3.5–5.0)
Anion gap: 5 (ref 5–15)
BUN: 14 mg/dL (ref 6–20)
CALCIUM: 9.6 mg/dL (ref 8.9–10.3)
CHLORIDE: 104 mmol/L (ref 101–111)
CO2: 29 mmol/L (ref 22–32)
CREATININE: 0.86 mg/dL (ref 0.44–1.00)
GFR calc Af Amer: >60 mL/min (ref >60)
Glucose, Bld: 90 mg/dL (ref 65–99)
Potassium: 4 mmol/L (ref 3.5–5.1)
SODIUM: 138 mmol/L (ref 135–145)
Total Bilirubin: 0.8 mg/dL (ref 0.3–1.2)
Total Protein: 7.4 g/dL (ref 6.5–8.1)

## 2016-03-07 LAB — CBC
HCT: 36.3 % (ref 36.0–46.0)
Hemoglobin: 12.2 g/dL (ref 12.0–15.0)
MCH: 31.8 pg (ref 26.0–34.0)
MCHC: 33.6 g/dL (ref 30.0–36.0)
MCV: 94.5 fL (ref 78.0–100.0)
PLATELETS: 212 10*3/uL (ref 150–400)
RBC: 3.84 MIL/uL — ABNORMAL LOW (ref 3.87–5.11)
RDW: 12.3 % (ref 11.5–15.5)
WBC: 7.9 10*3/uL (ref 4.0–10.5)

## 2016-03-07 LAB — LIPASE, BLOOD: LIPASE: 24 U/L (ref 11–51)

## 2016-03-07 MED ORDER — ONDANSETRON 4 MG PO TBDP
4.0000 mg | ORAL_TABLET | Freq: Once | ORAL | Status: AC | PRN
  Administered 2016-03-07: 4 mg via ORAL
  Filled 2016-03-07: qty 1

## 2016-03-07 NOTE — ED Triage Notes (Signed)
Patient reports generalized body aches, nausea, 1 episode of diarrhea, and urinary frequency x3 days. Patient took OTC nausea medication last night without relief.

## 2016-03-07 NOTE — ED Provider Notes (Signed)
[facility] DEPT Provider Note   CSN: [id number] Arrival date & time: 03/07/16  1820  By signing my name below, I, Julien Nordmann, attest that this documentation has been prepared under the direction and in the presence of Adabella Stanis, MD.  Electronically Signed: Julien Nordmann, ED Scribe. 03/08/16. 12:40 AM.    History   Chief Complaint Chief Complaint  Patient presents with  . Generalized Body Aches  . Diarrhea  . Nausea    The history is provided by the patient. No language interpreter was used.  Influenza  Presenting symptoms: diarrhea, myalgias and nausea   Presenting symptoms: no fever, no sore throat and no vomiting   Severity:  Moderate Onset quality:  Gradual Duration:  3 days Progression:  Unchanged Chronicity:  New Relieved by:  Nothing Worsened by:  Nothing Ineffective treatments:  OTC medications Associated symptoms: no chills   Risk factors: not elderly, no diabetes problem, no heart disease, no immunocompromised state, no kidney disease, no liver disease and not pregnant    HPI Comments: Madison Walker is a 51 y.o. female who presents to the Emergency Department complaining of moderate, gradual worsening cold-like symptoms x 3 days. Pt has been having nausea, generalized myalgias, lower back pain, and one episode of diarrhea last night. Pt further expresses having abdominal cramping and distension secondary to frequent problems with constipation. She has taken OTC medication to alleviate her symptoms without relief. She denies vomiting and fever.   History reviewed. No pertinent past medical history.  Patient Active Problem List   Diagnosis Date Noted  . Dermoid cyst of ovary (8 cm) 04/07/2015    Past Surgical History:  Procedure Laterality Date  . LAPAROSCOPIC UNILATERAL SALPINGO OOPHERECTOMY Left 05/31/2015   Procedure: LAPAROSCOPIC UNILATERAL SALPINGO OOPHORECTOMY;  Surgeon: Osborne Oman, MD;  Location: [facility];  Service: Gynecology;  Laterality:  Left;  . TUBAL LIGATION    . UNILATERAL SALPINGECTOMY Right 05/31/2015   Procedure: UNILATERAL SALPINGECTOMY;  Surgeon: Osborne Oman, MD;  Location: [facility];  Service: Gynecology;  Laterality: Right;    OB History    Gravida Para Term Preterm AB Living   2 2 2     2    SAB TAB Ectopic Multiple Live Births                   Home Medications    Prior to Admission medications   Medication Sig Start Date End Date Taking? Authorizing Provider  aspirin EC 81 MG tablet Take 81 mg by mouth 3 (three) times a week.     Historical Provider, MD  azithromycin (ZITHROMAX) 250 MG tablet Take as directed: Two pills by mouth the first day, then one pill every day until completed Patient not taking: Reported on 06/20/2015 05/31/15   Osborne Oman, MD  benzonatate (TESSALON) 100 MG capsule Take 1 capsule (100 mg total) by mouth every 8 (eight) hours. Patient not taking: Reported on 09/16/2015 06/23/15   Olivia Canter Sam, PA-C  docusate sodium (COLACE) 100 MG capsule Take 1 capsule (100 mg total) by mouth 2 (two) times daily as needed. Patient not taking: Reported on 06/20/2015 05/31/15   Osborne Oman, MD  fluticasone (FLONASE) 50 MCG/ACT nasal spray Place 2 sprays into both nostrils daily. Patient not taking: Reported on 09/16/2015 06/23/15   Olivia Canter Sam, PA-C  GARLIC OIL PO Take 2 tablets by mouth 3 (three) times a week.    Historical Provider, MD  ibuprofen (ADVIL,MOTRIN) 800 MG tablet Take 1  tablet (800 mg total) by mouth 3 (three) times daily with meals as needed for headache or moderate pain. Patient not taking: Reported on 06/20/2015 05/31/15   Osborne Oman, MD  ibuprofen (ADVIL,MOTRIN) 800 MG tablet Take 1 tablet (800 mg total) by mouth 3 (three) times daily. Patient not taking: Reported on 09/16/2015 06/23/15   Olivia Canter Sam, PA-C  Misc Natural Products (APPLE CIDER VINEGAR) TABS Take 2 tablets by mouth 3 (three) times a week.    Historical Provider, MD  oxyCODONE-acetaminophen (PERCOCET/ROXICET) 5-325  MG tablet Take 1-2 tablets by mouth every 6 (six) hours as needed. Patient not taking: Reported on 06/20/2015 05/31/15   Osborne Oman, MD  promethazine (PHENERGAN) 25 MG tablet Take 1 tablet (25 mg total) by mouth every 6 (six) hours as needed for nausea or vomiting. Patient not taking: Reported on 06/20/2015 05/31/15   Osborne Oman, MD  Simethicone (GAS-X PO) Take 1-2 tablets by mouth daily as needed (gas).    Historical Provider, MD    Family History Family History  Problem Relation Age of Onset  . Diabetes Other     Social History Social History  Substance Use Topics  . Smoking status: Never Smoker  . Smokeless tobacco: Never Used  . Alcohol use No     Allergies   Iodine   Review of Systems Review of Systems  Constitutional: Negative for chills and fever.  HENT: Negative for sore throat.   Gastrointestinal: Positive for diarrhea and nausea. Negative for vomiting.  Musculoskeletal: Positive for myalgias.  All other systems reviewed and are negative.    Physical Exam Updated Vital Signs BP 134/86 (BP Location: Left Arm)   Pulse 69   Temp 98.6 F (37 C) (Oral)   Resp 18   Ht 5\' 9"  (1.753 m)   Wt 176 lb (79.8 kg)   SpO2 98%   BMI 25.99 kg/m   Physical Exam  Constitutional: She is oriented to person, place, and time. She appears well-developed and well-nourished.  HENT:  Head: Normocephalic and atraumatic.  Mouth/Throat: Oropharynx is clear and moist. No oropharyngeal exudate.  Moist mucous membranes. No exudates.   Eyes: Conjunctivae and EOM are normal. Pupils are equal, round, and reactive to light.  Neck: Normal range of motion. Neck supple. No JVD present. No tracheal deviation present.  No carotid bruits. Trachea midline.   Cardiovascular: Normal rate, regular rhythm, normal heart sounds and intact distal pulses.  Exam reveals no gallop and no friction rub.   No murmur heard. RRR.   Pulmonary/Chest: Effort normal and breath sounds normal. No stridor.  No respiratory distress. She has no wheezes. She has no rales.  Lungs CTA bilaterally.   Abdominal: Soft. She exhibits distension. Bowel sounds are increased. There is tenderness. There is no rebound and no guarding.  Palpable stool and descending and transverse colon  Musculoskeletal: Normal range of motion.  Lymphadenopathy:    She has no cervical adenopathy.  Neurological: She is alert and oriented to person, place, and time. She has normal reflexes. She displays normal reflexes.  Skin: Skin is warm and dry.  Psychiatric: She has a normal mood and affect.  Nursing note and vitals reviewed.    ED Treatments / Results  DIAGNOSTIC STUDIES: Oxygen Saturation is 98% on RA, normal by my interpretation.  COORDINATION OF CARE:  12:36 AM Discussed treatment plan with pt at bedside and pt agreed to plan.  Labs . Results for orders placed or performed during the hospital encounter  of 03/07/16  Lipase, blood  Result Value Ref Range   Lipase 24 11 - 51 U/L  Comprehensive metabolic panel  Result Value Ref Range   Sodium 138 135 - 145 mmol/L   Potassium 4.0 3.5 - 5.1 mmol/L   Chloride 104 101 - 111 mmol/L   CO2 29 22 - 32 mmol/L   Glucose, Bld 90 65 - 99 mg/dL   BUN 14 6 - 20 mg/dL   Creatinine, Ser 0.86 0.44 - 1.00 mg/dL   Calcium 9.6 8.9 - 10.3 mg/dL   Total Protein 7.4 6.5 - 8.1 g/dL   Albumin 4.7 3.5 - 5.0 g/dL   AST 20 15 - 41 U/L   ALT 18 14 - 54 U/L   Alkaline Phosphatase 83 38 - 126 U/L   Total Bilirubin 0.8 0.3 - 1.2 mg/dL   GFR calc non Af Amer >60 >60 mL/min   GFR calc Af Amer >60 >60 mL/min   Anion gap 5 5 - 15  CBC  Result Value Ref Range   WBC 7.9 4.0 - 10.5 K/uL   RBC 3.84 (L) 3.87 - 5.11 MIL/uL   Hemoglobin 12.2 12.0 - 15.0 g/dL   HCT 36.3 36.0 - 46.0 %   MCV 94.5 78.0 - 100.0 fL   MCH 31.8 26.0 - 34.0 pg   MCHC 33.6 30.0 - 36.0 g/dL   RDW 12.3 11.5 - 15.5 %   Platelets 212 150 - 400 K/uL  Urinalysis, Routine w reflex microscopic  Result Value Ref Range     Color, Urine YELLOW YELLOW   APPearance CLEAR CLEAR   Specific Gravity, Urine 1.018 1.005 - 1.030   pH 6.0 5.0 - 8.0   Glucose, UA NEGATIVE NEGATIVE mg/dL   Hgb urine dipstick NEGATIVE NEGATIVE   Bilirubin Urine NEGATIVE NEGATIVE   Ketones, ur NEGATIVE NEGATIVE mg/dL   Protein, ur NEGATIVE NEGATIVE mg/dL   Nitrite NEGATIVE NEGATIVE   Leukocytes, UA NEGATIVE NEGATIVE   Dg Abd Acute W/chest  Result Date: 03/08/2016 CLINICAL DATA:  51 year old female with constipation and cramping. EXAM: DG ABDOMEN ACUTE W/ 1V CHEST COMPARISON:  Chest radiograph dated 10/12/2015 abdominal CT dated 03/23/2015 FINDINGS: The lungs are clear. There is no pleural effusion or pneumothorax. Top-normal cardiac silhouette. There is no bowel dilatation or evidence of obstruction. No free air or radiopaque calculi noted. The osseous structures and the soft tissues are grossly unremarkable. IMPRESSION: Negative abdominal radiographs.  No acute cardiopulmonary disease. Electronically Signed   By: Anner Crete M.D.   On: 03/08/2016 02:10    Radiology No results found.  Procedures Procedures (including critical care time)  Medications Ordered in ED  Medications  ondansetron (ZOFRAN-ODT) disintegrating tablet 4 mg (4 mg Oral Given 03/07/16 1844)  gi cocktail (Maalox,Lidocaine,Donnatal) (30 mLs Oral Given 03/08/16 0103)  dicyclomine (BENTYL) capsule 10 mg (10 mg Oral Given 03/08/16 0103)     Final Clinical Impressions(s) / ED Diagnoses  Viral diarrhea:  All questions answered to patient's satisfaction. Based on history and exam patient has been appropriately medically screened and emergency conditions excluded. Patient is stable for discharge at this time. Strict return precautions given for any further episodes, persistent fever, weakness or any concerns.  I personally performed the services described in this documentation, which was scribed in my presence. The recorded information has been reviewed and is  accurate.       Veatrice Kells, MD 03/08/16 [phone]

## 2016-03-08 ENCOUNTER — Emergency Department (HOSPITAL_COMMUNITY): Payer: Self-pay

## 2016-03-08 ENCOUNTER — Encounter (HOSPITAL_COMMUNITY): Payer: Self-pay | Admitting: Emergency Medicine

## 2016-03-08 MED ORDER — DICYCLOMINE HCL 10 MG PO CAPS
10.0000 mg | ORAL_CAPSULE | Freq: Once | ORAL | Status: AC
  Administered 2016-03-08: 10 mg via ORAL
  Filled 2016-03-08: qty 1

## 2016-03-08 MED ORDER — DICYCLOMINE HCL 20 MG PO TABS: 20.0000 mg | ORAL_TABLET | Freq: Two times a day (BID) | ORAL | 0 refills | Status: DC

## 2016-03-08 MED ORDER — GI COCKTAIL ~~LOC~~
30.0000 mL | Freq: Once | ORAL | Status: AC
  Administered 2016-03-08: 30 mL via ORAL
  Filled 2016-03-08: qty 30

## 2016-03-28 ENCOUNTER — Encounter (HOSPITAL_COMMUNITY): Payer: Self-pay | Admitting: Family Medicine

## 2016-03-28 DIAGNOSIS — Z7982 Long term (current) use of aspirin: Secondary | ICD-10-CM | POA: Insufficient documentation

## 2016-03-28 DIAGNOSIS — R1084 Generalized abdominal pain: Secondary | ICD-10-CM | POA: Insufficient documentation

## 2016-03-28 LAB — CBC
HCT: 34.5 % — ABNORMAL LOW (ref 36.0–46.0)
Hemoglobin: 11.9 g/dL — ABNORMAL LOW (ref 12.0–15.0)
MCH: 32.6 pg (ref 26.0–34.0)
MCHC: 34.5 g/dL (ref 30.0–36.0)
MCV: 94.5 fL (ref 78.0–100.0)
Platelets: 183 10*3/uL (ref 150–400)
RBC: 3.65 MIL/uL — ABNORMAL LOW (ref 3.87–5.11)
RDW: 12.3 % (ref 11.5–15.5)
WBC: 6.5 10*3/uL (ref 4.0–10.5)

## 2016-03-28 LAB — COMPREHENSIVE METABOLIC PANEL
ALT: 18 U/L (ref 14–54)
AST: 21 U/L (ref 15–41)
Albumin: 4.5 g/dL (ref 3.5–5.0)
Alkaline Phosphatase: 81 U/L (ref 38–126)
Anion gap: 6 (ref 5–15)
BILIRUBIN TOTAL: 0.7 mg/dL (ref 0.3–1.2)
BUN: 17 mg/dL (ref 6–20)
CHLORIDE: 108 mmol/L (ref 101–111)
CO2: 25 mmol/L (ref 22–32)
CREATININE: 0.79 mg/dL (ref 0.44–1.00)
Calcium: 9.3 mg/dL (ref 8.9–10.3)
GFR calc Af Amer: >60 mL/min (ref >60)
Glucose, Bld: 94 mg/dL (ref 65–99)
Potassium: 3.7 mmol/L (ref 3.5–5.1)
Sodium: 139 mmol/L (ref 135–145)
TOTAL PROTEIN: 7.4 g/dL (ref 6.5–8.1)

## 2016-03-28 LAB — URINALYSIS, ROUTINE W REFLEX MICROSCOPIC
BILIRUBIN URINE: NEGATIVE
Bacteria, UA: NONE SEEN
Glucose, UA: NEGATIVE mg/dL
Hgb urine dipstick: NEGATIVE
Ketones, ur: NEGATIVE mg/dL
Nitrite: NEGATIVE
PH: 5 (ref 5.0–8.0)
Protein, ur: NEGATIVE mg/dL
SPECIFIC GRAVITY, URINE: 1.02 (ref 1.005–1.030)

## 2016-03-28 LAB — LIPASE, BLOOD: Lipase: 31 U/L (ref 11–51)

## 2016-03-28 LAB — POC URINE PREG, ED: Preg Test, Ur: NEGATIVE

## 2016-03-28 NOTE — ED Triage Notes (Signed)
Patient complains of body aches, lower back pain, and vomiting. Symptoms started 3-4 weeks ago and has not resolved. Pt came to [facility] for symptoms and possible dx Norovirus. Denies vomiting in the last 24 hours. Took a Bentyl this morning with no relief.

## 2016-03-29 ENCOUNTER — Emergency Department (HOSPITAL_COMMUNITY)
Admission: EM | Admit: 2016-03-29 | Discharge: 2016-03-29 | Disposition: A | Discharge status: Home / Self Care | Payer: Self-pay | Attending: Emergency Medicine | Admitting: Emergency Medicine

## 2016-03-29 DIAGNOSIS — R1084 Generalized abdominal pain: Secondary | ICD-10-CM

## 2016-03-29 MED ORDER — POLYETHYLENE GLYCOL 3350 17 G PO PACK: 17.0000 g | PACK | Freq: Every day | ORAL | 0 refills | Status: DC

## 2016-03-29 NOTE — ED Provider Notes (Signed)
[facility] DEPT Provider Note   CSN: [id number] Arrival date & time: 03/28/16  2029  By signing my name below, I, Madison Walker, attest that this documentation has been prepared under the direction and in the presence of non-physician practitioner, Nehemiah Settle A Sagar Tengan PA-C. Electronically Signed: Dolores Walker, Scribe. 03/29/2016. 12:24 AM.  History   Chief Complaint Chief Complaint  Patient presents with  . Influenza  . Emesis  . Back Pain   The history is provided by the patient. No language interpreter was used.    HPI Comments:  Madison Walker is a 51 y.o. female who presents to the Emergency Department complaining of moderate, constant abdominal cramping onset 4 weeks. Pt has been experiencing diffuse myalgias, abdominal pain, back pain and nausea exacerbated by eating. She was seen 3 weeks ago in the ED for her symptoms and given Bentyl, which she has been taking without relief. Pt denies constipation or vomiting. Pshx of tubal ligation and salpingo oophorectomy.   History reviewed. No pertinent past medical history.  Patient Active Problem List   Diagnosis Date Noted  . Dermoid cyst of ovary (8 cm) 04/07/2015    Past Surgical History:  Procedure Laterality Date  . LAPAROSCOPIC UNILATERAL SALPINGO OOPHERECTOMY Left 05/31/2015   Procedure: LAPAROSCOPIC UNILATERAL SALPINGO OOPHORECTOMY;  Surgeon: Osborne Oman, MD;  Location: [facility];  Service: Gynecology;  Laterality: Left;  . TUBAL LIGATION    . UNILATERAL SALPINGECTOMY Right 05/31/2015   Procedure: UNILATERAL SALPINGECTOMY;  Surgeon: Osborne Oman, MD;  Location: [facility];  Service: Gynecology;  Laterality: Right;    OB History    Gravida Para Term Preterm AB Living   2 2 2     2    SAB TAB Ectopic Multiple Live Births                   Home Medications    Prior to Admission medications   Medication Sig Start Date End Date Taking? Authorizing Provider  aspirin EC 81 MG tablet Take 81 mg by mouth 3 (three) times  a week.     Historical Provider, MD  azithromycin (ZITHROMAX) 250 MG tablet Take as directed: Two pills by mouth the first day, then one pill every day until completed Patient not taking: Reported on 06/20/2015 05/31/15   Osborne Oman, MD  benzonatate (TESSALON) 100 MG capsule Take 1 capsule (100 mg total) by mouth every 8 (eight) hours. Patient not taking: Reported on 09/16/2015 06/23/15   Olivia Canter Sam, PA-C  dicyclomine (BENTYL) 20 MG tablet Take 1 tablet (20 mg total) by mouth 2 (two) times daily. 03/08/16   April Palumbo, MD  docusate sodium (COLACE) 100 MG capsule Take 1 capsule (100 mg total) by mouth 2 (two) times daily as needed. Patient not taking: Reported on 06/20/2015 05/31/15   Osborne Oman, MD  fluticasone (FLONASE) 50 MCG/ACT nasal spray Place 2 sprays into both nostrils daily. Patient not taking: Reported on 09/16/2015 06/23/15   Olivia Canter Sam, PA-C  GARLIC OIL PO Take 2 tablets by mouth 3 (three) times a week.    Historical Provider, MD  ibuprofen (ADVIL,MOTRIN) 800 MG tablet Take 1 tablet (800 mg total) by mouth 3 (three) times daily with meals as needed for headache or moderate pain. Patient not taking: Reported on 06/20/2015 05/31/15   Osborne Oman, MD  ibuprofen (ADVIL,MOTRIN) 800 MG tablet Take 1 tablet (800 mg total) by mouth 3 (three) times daily. Patient not taking: Reported on 09/16/2015 06/23/15  Olivia Canter Sam, PA-C  Misc Natural Products (APPLE CIDER VINEGAR) TABS Take 2 tablets by mouth 3 (three) times a week.    Historical Provider, MD  oxyCODONE-acetaminophen (PERCOCET/ROXICET) 5-325 MG tablet Take 1-2 tablets by mouth every 6 (six) hours as needed. Patient not taking: Reported on 06/20/2015 05/31/15   Osborne Oman, MD  promethazine (PHENERGAN) 25 MG tablet Take 1 tablet (25 mg total) by mouth every 6 (six) hours as needed for nausea or vomiting. Patient not taking: Reported on 06/20/2015 05/31/15   Osborne Oman, MD  Simethicone (GAS-X PO) Take 1-2 tablets by mouth daily  as needed (gas).    Historical Provider, MD    Family History Family History  Problem Relation Age of Onset  . Diabetes Other     Social History Social History  Substance Use Topics  . Smoking status: Never Smoker  . Smokeless tobacco: Never Used  . Alcohol use No     Allergies   Iodine   Review of Systems Review of Systems  Constitutional: Negative for fever.  Respiratory: Negative.   Cardiovascular: Negative.   Gastrointestinal: Positive for abdominal pain and nausea. Negative for constipation and vomiting.  Genitourinary: Negative.  Negative for dysuria and vaginal discharge.  Musculoskeletal: Positive for back pain and myalgias.  Neurological: Negative.  Negative for weakness.     Physical Exam Updated Vital Signs BP 124/81 (BP Location: Left Arm)   Pulse 66   Temp 98.1 F (36.7 C) (Oral)   Resp 18   Ht 5\' 9"  (1.753 m)   Wt 176 lb (79.8 kg)   SpO2 99%   BMI 25.99 kg/m   Physical Exam  Constitutional: She is oriented to person, place, and time. She appears well-developed and well-nourished. No distress.  HENT:  Head: Normocephalic and atraumatic.  Mouth/Throat: Oropharynx is clear and moist.  Eyes: Conjunctivae are normal.  Neck: Normal range of motion.  Cardiovascular: Normal rate, regular rhythm and normal heart sounds.   Pulmonary/Chest: Effort normal and breath sounds normal.  Abdominal: Soft. She exhibits no distension. There is no tenderness.  Musculoskeletal: Normal range of motion.  Neurological: She is alert and oriented to person, place, and time.  Skin: Skin is warm and dry.  Psychiatric: She has a normal mood and affect.  Nursing note and vitals reviewed.  ED Treatments / Results  DIAGNOSTIC STUDIES:  Oxygen Saturation is 99% on RA, normal by my interpretation.    COORDINATION OF CARE:  12:45 AM Discussed treatment plan with pt at bedside which includes referral to gastroenterologist and pt agreed to plan.  Labs (all labs ordered  are listed, but only abnormal results are displayed) Labs Reviewed  CBC - Abnormal; Notable for the following:       Result Value   RBC 3.65 (*)    Hemoglobin 11.9 (*)    HCT 34.5 (*)    All other components within normal limits  URINALYSIS, ROUTINE W REFLEX MICROSCOPIC - Abnormal; Notable for the following:    Leukocytes, UA TRACE (*)    Squamous Epithelial / LPF 0-5 (*)    All other components within normal limits  LIPASE, BLOOD  COMPREHENSIVE METABOLIC PANEL  POC URINE PREG, ED    EKG  EKG Interpretation None       Radiology No results found.  Procedures Procedures (including critical care time)  Medications Ordered in ED Medications - No data to display   Initial Impression / Assessment and Plan / ED Course  I have  reviewed the triage vital signs and the nursing notes.  Pertinent labs & imaging results that were available during my care of the patient were reviewed by me and considered in my medical decision making (see chart for details).     The patient presents with persistent generalized abdominal and back pain x 4 weeks. Labs unremarkable. Bentyl without relief. Will start Miralax with symptoms of "gas" and abdominal discomfort. Recommended GI follow up.  Final Clinical Impressions(s) / ED Diagnoses   Final diagnoses:  None   1. Diffuse abdominal pain  New Prescriptions New Prescriptions   No medications on file  I personally performed the services described in this documentation, which was scribed in my presence. The recorded information has been reviewed and is accurate.      Charlann Lange, PA-C 03/29/16 0730    Merryl Hacker, MD 03/29/16 [phone]

## 2017-04-25 ENCOUNTER — Ambulatory Visit (HOSPITAL_COMMUNITY)
Admission: EM | Admit: 2017-04-25 | Discharge: 2017-04-25 | Disposition: A | Discharge status: Home / Self Care | Payer: Self-pay | Attending: Internal Medicine | Admitting: Internal Medicine

## 2017-04-25 ENCOUNTER — Encounter (HOSPITAL_COMMUNITY): Payer: Self-pay | Admitting: Family Medicine

## 2017-04-25 DIAGNOSIS — L299 Pruritus, unspecified: Secondary | ICD-10-CM

## 2017-04-25 MED ORDER — PYRITHIONE ZINC 1 % EX SHAM: MEDICATED_SHAMPOO | CUTANEOUS | 12 refills | Status: DC

## 2017-04-25 NOTE — Discharge Instructions (Signed)
Please use to the selsun shampoo daily for the next week, followed by three times a week as needed for persistent symptoms. If symptoms worsen or do not improve in the next week to return to be seen or to follow up with your PCP.

## 2017-04-25 NOTE — ED Provider Notes (Signed)
[place]    CSN: [id number] Arrival date & time: 04/25/17  1408     History   Chief Complaint Chief Complaint  Patient presents with  . Rash    HPI Cyniah Gossard is a 52 y.o. female.   Alajiah presents with complaints of itching and burning scalp for the past 2 months. She has tried to apply lotion which has not helped. For the past two days she has tried a tea tree shampoo which has mildly helped. States she feels bumps at times. No known drainage or flaking. Denies any previous similar. No other rash. Does not take any other prescribed medications.   ROS per HPI.       History reviewed. No pertinent past medical history.  Patient Active Problem List   Diagnosis Date Noted  . Dermoid cyst of ovary (8 cm) 04/07/2015    Past Surgical History:  Procedure Laterality Date  . LAPAROSCOPIC UNILATERAL SALPINGO OOPHERECTOMY Left 05/31/2015   Procedure: LAPAROSCOPIC UNILATERAL SALPINGO OOPHORECTOMY;  Surgeon: Osborne Oman, MD;  Location: [facility];  Service: Gynecology;  Laterality: Left;  . TUBAL LIGATION    . UNILATERAL SALPINGECTOMY Right 05/31/2015   Procedure: UNILATERAL SALPINGECTOMY;  Surgeon: Osborne Oman, MD;  Location: [facility];  Service: Gynecology;  Laterality: Right;    OB History    Gravida Para Term Preterm AB Living   2 2 2     2    SAB TAB Ectopic Multiple Live Births                   Home Medications    Prior to Admission medications   Medication Sig Start Date End Date Taking? Authorizing Provider  aspirin EC 81 MG tablet Take 81 mg by mouth 3 (three) times a week.     [provider]  azithromycin (ZITHROMAX) 250 MG tablet Take as directed: Two pills by mouth the first day, then one pill every day until completed Patient not taking: Reported on 06/20/2015 05/31/15   Anyanwu, Sallyanne Havers, MD  benzonatate (TESSALON) 100 MG capsule Take 1 capsule (100 mg total) by mouth every 8 (eight) hours. Patient not taking: Reported on  09/16/2015 06/23/15   Sam, Olivia Canter, PA-C  dicyclomine (BENTYL) 20 MG tablet Take 1 tablet (20 mg total) by mouth 2 (two) times daily. 03/08/16   Palumbo, April, MD  docusate sodium (COLACE) 100 MG capsule Take 1 capsule (100 mg total) by mouth 2 (two) times daily as needed. Patient not taking: Reported on 06/20/2015 05/31/15   Anyanwu, Sallyanne Havers, MD  fluticasone (FLONASE) 50 MCG/ACT nasal spray Place 2 sprays into both nostrils daily. Patient not taking: Reported on 09/16/2015 06/23/15   Sam, Olivia Canter, PA-C  GARLIC OIL PO Take 2 tablets by mouth 3 (three) times a week.    [provider]  ibuprofen (ADVIL,MOTRIN) 800 MG tablet Take 1 tablet (800 mg total) by mouth 3 (three) times daily with meals as needed for headache or moderate pain. Patient not taking: Reported on 06/20/2015 05/31/15   Anyanwu, Sallyanne Havers, MD  ibuprofen (ADVIL,MOTRIN) 800 MG tablet Take 1 tablet (800 mg total) by mouth 3 (three) times daily. Patient not taking: Reported on 09/16/2015 06/23/15   Sam, Olivia Canter, PA-C  Misc Natural Products (APPLE CIDER VINEGAR) TABS Take 2 tablets by mouth 3 (three) times a week.    [provider]  oxyCODONE-acetaminophen (PERCOCET/ROXICET) 5-325 MG tablet Take 1-2 tablets by mouth every 6 (six) hours as needed.  Patient not taking: Reported on 06/20/2015 05/31/15   Osborne Oman, MD  polyethylene glycol (MIRALAX) packet Take 17 g by mouth daily. 03/29/16   Charlann Lange, PA-C  promethazine (PHENERGAN) 25 MG tablet Take 1 tablet (25 mg total) by mouth every 6 (six) hours as needed for nausea or vomiting. Patient not taking: Reported on 06/20/2015 05/31/15   Anyanwu, Sallyanne Havers, MD  pyrithione zinc (SELSUN BLUE DRY SCALP) 1 % shampoo Daily shampoo for the next week followed by 3 times a week as needed for persistent symptoms. 04/25/17   Zigmund Gottron, NP  Simethicone (GAS-X PO) Take 1-2 tablets by mouth daily as needed (gas).    [provider]    Family History Family History  Problem  Relation Age of Onset  . Diabetes Other     Social History Social History   Tobacco Use  . Smoking status: Never Smoker  . Smokeless tobacco: Never Used  Substance Use Topics  . Alcohol use: No  . Drug use: No     Allergies   Patient has no known allergies.   Review of Systems Review of Systems   Physical Exam Triage Vital Signs ED Triage Vitals [04/25/17 1534]  Enc Vitals Group     BP 137/74     Pulse Rate 85     Resp 18     Temp 98.5 F (36.9 C)     Temp src      SpO2 100 %     Weight      Height      Head Circumference      Peak Flow      Pain Score      Pain Loc      Pain Edu?      Excl. in [place]?    No data found.  Updated Vital Signs BP 137/74   Pulse 85   Temp 98.5 F (36.9 C)   Resp 18   SpO2 100%   Visual Acuity Right Eye Distance:   Left Eye Distance:   Bilateral Distance:    Right Eye Near:   Left Eye Near:    Bilateral Near:     Physical Exam  Constitutional: She is oriented to person, place, and time. She appears well-developed and well-nourished. No distress.  Cardiovascular: Normal rate, regular rhythm and normal heart sounds.  Pulmonary/Chest: Effort normal and breath sounds normal.  Neurological: She is alert and oriented to person, place, and time.  Skin: Skin is warm and dry.  Scalp without any visible lesions, rash, lice, nits. Rare flakes only     UC Treatments / Results  Labs (all labs ordered are listed, but only abnormal results are displayed) Labs Reviewed - No data to display  EKG  EKG Interpretation None       Radiology No results found.  Procedures Procedures (including critical care time)  Medications Ordered in UC Medications - No data to display   Initial Impression / Assessment and Plan / UC Course  I have reviewed the triage vital signs and the nursing notes.  Pertinent labs & imaging results that were available during my care of the patient were reviewed by me and considered in my medical  decision making (see chart for details).     Selsun blue recommended at this time for itching scalp. Without visible rash or lesions to scalp. If symptoms worsen or do not improve in the next 2-3 weeks to return to be seen or to follow up  with PCP.  Patient verbalized understanding and agreeable to plan.    Final Clinical Impressions(s) / UC Diagnoses   Final diagnoses:  Itchy scalp    ED Discharge Orders        Ordered    pyrithione zinc (SELSUN BLUE DRY SCALP) 1 % shampoo     04/25/17 1628       Controlled Substance Prescriptions [place] Controlled Substance Registry consulted? Not Applicable   Zigmund Gottron, NP 04/25/17 1637

## 2017-04-25 NOTE — ED Triage Notes (Signed)
Pt here for rash to scalp with itching and soreness x 2 months. She has been using conditioner and not helping.

## 2017-05-10 ENCOUNTER — Encounter (HOSPITAL_COMMUNITY): Payer: Self-pay | Admitting: Emergency Medicine

## 2017-05-10 ENCOUNTER — Ambulatory Visit (HOSPITAL_COMMUNITY)
Admission: EM | Admit: 2017-05-10 | Discharge: 2017-05-10 | Disposition: A | Discharge status: Home / Self Care | Payer: Self-pay | Attending: Family Medicine | Admitting: Family Medicine

## 2017-05-10 DIAGNOSIS — R21 Rash and other nonspecific skin eruption: Secondary | ICD-10-CM

## 2017-05-10 DIAGNOSIS — R030 Elevated blood-pressure reading, without diagnosis of hypertension: Secondary | ICD-10-CM

## 2017-05-10 DIAGNOSIS — L259 Unspecified contact dermatitis, unspecified cause: Secondary | ICD-10-CM

## 2017-05-10 MED ORDER — PREDNISONE 5 MG (21) PO TBPK: ORAL_TABLET | ORAL | 0 refills | Status: DC

## 2017-05-10 MED FILL — predniSONE 5 MG TABS: 5 | 6 days supply | Qty: 21 | Fill #0

## 2017-05-10 NOTE — Discharge Instructions (Addendum)
Not sure of the etiology of your rash. Happy to try a prednisone pack. Take this as directed. Hopefully this will clear up. Ok to use Benadryl 25mg  every 4-6 hours for itching. If continues would be best to see dermatology. Follow up with community [facility] for elevated BP.

## 2017-05-10 NOTE — ED Provider Notes (Signed)
[place]    CSN: [id number] Arrival date & time: 05/10/17  1108     History   Chief Complaint Chief Complaint  Patient presents with  . Rash    HPI Madison Walker is a 52 y.o. female.   Presents with ongoing itchiness to her scalp. She was evaluated here on 3/14 and instructed to use Selsem blue shampoo for 2 weeks which she did. She reports it has not been helpful. She states that her scalp itches behind her ears and now down her neck. It feels "irritated" at times. She see's red bumps at times. No additional body rashes. No known exposures.      History reviewed. No pertinent past medical history.  Patient Active Problem List   Diagnosis Date Noted  . Dermoid cyst of ovary (8 cm) 04/07/2015    Past Surgical History:  Procedure Laterality Date  . LAPAROSCOPIC UNILATERAL SALPINGO OOPHERECTOMY Left 05/31/2015   Procedure: LAPAROSCOPIC UNILATERAL SALPINGO OOPHORECTOMY;  Surgeon: Osborne Oman, MD;  Location: [facility];  Service: Gynecology;  Laterality: Left;  . TUBAL LIGATION    . UNILATERAL SALPINGECTOMY Right 05/31/2015   Procedure: UNILATERAL SALPINGECTOMY;  Surgeon: Osborne Oman, MD;  Location: [facility];  Service: Gynecology;  Laterality: Right;    OB History    Gravida  2   Para  2   Term  2   Preterm      AB      Living  2     SAB      TAB      Ectopic      Multiple      Live Births               Home Medications    Prior to Admission medications   Medication Sig Start Date End Date Taking? Authorizing Provider  aspirin EC 81 MG tablet Take 81 mg by mouth 3 (three) times a week.     [provider]  azithromycin (ZITHROMAX) 250 MG tablet Take as directed: Two pills by mouth the first day, then one pill every day until completed Patient not taking: Reported on 06/20/2015 05/31/15   Anyanwu, Sallyanne Havers, MD  benzonatate (TESSALON) 100 MG capsule Take 1 capsule (100 mg total) by mouth every 8 (eight) hours. Patient  not taking: Reported on 09/16/2015 06/23/15   Sam, Olivia Canter, PA-C  dicyclomine (BENTYL) 20 MG tablet Take 1 tablet (20 mg total) by mouth 2 (two) times daily. 03/08/16   Palumbo, April, MD  docusate sodium (COLACE) 100 MG capsule Take 1 capsule (100 mg total) by mouth 2 (two) times daily as needed. Patient not taking: Reported on 06/20/2015 05/31/15   Anyanwu, Sallyanne Havers, MD  fluticasone (FLONASE) 50 MCG/ACT nasal spray Place 2 sprays into both nostrils daily. Patient not taking: Reported on 09/16/2015 06/23/15   Sam, Olivia Canter, PA-C  GARLIC OIL PO Take 2 tablets by mouth 3 (three) times a week.    [provider]  ibuprofen (ADVIL,MOTRIN) 800 MG tablet Take 1 tablet (800 mg total) by mouth 3 (three) times daily with meals as needed for headache or moderate pain. Patient not taking: Reported on 06/20/2015 05/31/15   Anyanwu, Sallyanne Havers, MD  ibuprofen (ADVIL,MOTRIN) 800 MG tablet Take 1 tablet (800 mg total) by mouth 3 (three) times daily. Patient not taking: Reported on 09/16/2015 06/23/15   Anne Ng, PA-C  Misc Natural Products (APPLE CIDER VINEGAR) TABS Take 2 tablets by mouth  3 (three) times a week.    [provider]  oxyCODONE-acetaminophen (PERCOCET/ROXICET) 5-325 MG tablet Take 1-2 tablets by mouth every 6 (six) hours as needed. Patient not taking: Reported on 06/20/2015 05/31/15   Osborne Oman, MD  polyethylene glycol (MIRALAX) packet Take 17 g by mouth daily. 03/29/16   Charlann Lange, PA-C  promethazine (PHENERGAN) 25 MG tablet Take 1 tablet (25 mg total) by mouth every 6 (six) hours as needed for nausea or vomiting. Patient not taking: Reported on 06/20/2015 05/31/15   Anyanwu, Sallyanne Havers, MD  pyrithione zinc (SELSUN BLUE DRY SCALP) 1 % shampoo Daily shampoo for the next week followed by 3 times a week as needed for persistent symptoms. 04/25/17   Zigmund Gottron, NP  Simethicone (GAS-X PO) Take 1-2 tablets by mouth daily as needed (gas).    [provider]    Family  History Family History  Problem Relation Age of Onset  . Diabetes Other     Social History Social History   Tobacco Use  . Smoking status: Never Smoker  . Smokeless tobacco: Never Used  Substance Use Topics  . Alcohol use: No  . Drug use: No     Allergies   Patient has no known allergies.   Review of Systems Review of Systems  All other systems reviewed and are negative.    Physical Exam Triage Vital Signs ED Triage Vitals [05/10/17 1213]  Enc Vitals Group     BP (!) 133/103     Pulse Rate 68     Resp 18     Temp 98.6 F (37 C)     Temp src      SpO2 100 %     Weight      Height      Head Circumference      Peak Flow      Pain Score      Pain Loc      Pain Edu?      Excl. in [place]?    No data found.  Updated Vital Signs BP (!) 133/103   Pulse 68   Temp 98.6 F (37 C)   Resp 18   SpO2 100%   Visual Acuity Right Eye Distance:   Left Eye Distance:   Bilateral Distance:    Right Eye Near:   Left Eye Near:    Bilateral Near:     Physical Exam  Constitutional: She is oriented to person, place, and time. She appears well-developed and well-nourished. No distress.  HENT:  Head: Normocephalic and atraumatic.  Neurological: She is alert and oriented to person, place, and time.  Skin: Skin is warm and dry. She is not diaphoretic.  Very mild erythema to posterior ears and into lateral neck without evidence of scaling or peeling. Few excoriations  Psychiatric: Her behavior is normal.  Nursing note and vitals reviewed.    UC Treatments / Results  Labs (all labs ordered are listed, but only abnormal results are displayed) Labs Reviewed - No data to display  EKG None Radiology No results found.  Procedures Procedures (including critical care time)  Medications Ordered in UC Medications - No data to display   Initial Impression / Assessment and Plan / UC Course  I have reviewed the triage vital signs and the nursing notes.  Pertinent labs  & imaging results that were available during my care of the patient were reviewed by me and considered in my medical decision making (see chart for details).  Perhaps contact dermatitis, with 2nd visit to the Urgent care. Etiology is unclear will treat with prednisone pack 5 mg 6 day and also use Benadryl 25 mg for itching. If this is not improving then we would encourage a Dermatologist specialist.   Final Clinical Impressions(s) / UC Diagnoses   Final diagnoses:  None    ED Discharge Orders    None       Controlled Substance Prescriptions [place] Controlled Substance Registry consulted? Not Applicable   Prudencio Pair 05/10/17 1247

## 2017-05-10 NOTE — ED Triage Notes (Signed)
Pt c/o rash on scalp x1 month, states she was seen here two weeks ago and told she had dry scalp, has been using dandruff shampoo without relief.

## 2017-09-04 ENCOUNTER — Emergency Department (HOSPITAL_COMMUNITY): Payer: Self-pay

## 2017-09-04 ENCOUNTER — Other Ambulatory Visit: Payer: Self-pay

## 2017-09-04 ENCOUNTER — Encounter (HOSPITAL_COMMUNITY): Payer: Self-pay

## 2017-09-04 DIAGNOSIS — R0789 Other chest pain: Secondary | ICD-10-CM | POA: Insufficient documentation

## 2017-09-04 DIAGNOSIS — Z7982 Long term (current) use of aspirin: Secondary | ICD-10-CM | POA: Insufficient documentation

## 2017-09-04 DIAGNOSIS — Z79899 Other long term (current) drug therapy: Secondary | ICD-10-CM | POA: Insufficient documentation

## 2017-09-04 LAB — BASIC METABOLIC PANEL
ANION GAP: 10 (ref 5–15)
BUN: 23 mg/dL — ABNORMAL HIGH (ref 6–20)
CALCIUM: 9.8 mg/dL (ref 8.9–10.3)
CO2: 27 mmol/L (ref 22–32)
Chloride: 106 mmol/L (ref 98–111)
Creatinine, Ser: 0.92 mg/dL (ref 0.44–1.00)
Glucose, Bld: 138 mg/dL — ABNORMAL HIGH (ref 70–99)
Potassium: 3.8 mmol/L (ref 3.5–5.1)
SODIUM: 143 mmol/L (ref 135–145)

## 2017-09-04 LAB — CBC
HCT: 37.7 % (ref 36.0–46.0)
HEMOGLOBIN: 12.8 g/dL (ref 12.0–15.0)
MCH: 33.6 pg (ref 26.0–34.0)
MCHC: 34 g/dL (ref 30.0–36.0)
MCV: 99 fL (ref 78.0–100.0)
Platelets: 200 10*3/uL (ref 150–400)
RBC: 3.81 MIL/uL — AB (ref 3.87–5.11)
RDW: 12.5 % (ref 11.5–15.5)
WBC: 6.9 10*3/uL (ref 4.0–10.5)

## 2017-09-04 LAB — I-STAT TROPONIN, ED: TROPONIN I, POC: 0 ng/mL (ref 0.00–0.08)

## 2017-09-04 NOTE — ED Triage Notes (Signed)
Pt presents to ED from home for chest pain. Pt reports that she has had chest pain on and off for 2 weeks. Pt reports that she developed arm pain tonight also.

## 2017-09-05 ENCOUNTER — Emergency Department (HOSPITAL_COMMUNITY)
Admission: EM | Admit: 2017-09-05 | Discharge: 2017-09-05 | Disposition: A | Discharge status: Home / Self Care | Payer: Self-pay | Attending: Emergency Medicine | Admitting: Emergency Medicine

## 2017-09-05 DIAGNOSIS — R079 Chest pain, unspecified: Secondary | ICD-10-CM

## 2017-09-05 NOTE — ED Provider Notes (Signed)
[facility] DEPT Provider Note   CSN: [id number] Arrival date & time: 09/04/17  2240     History   Chief Complaint Chief Complaint  Patient presents with  . Chest Pain    HPI Madison Walker is a 52 y.o. female.  The history is provided by the patient and medical records.    52 y.o. F with history of ovarian cyst, presenting to the ED for chest pain.  Patient reports for the past 2 weeks she has intermittently been having a dull ache in the left side of her chest.  States this comes on randomly, lasts about 2-3 minutes, then goes away on its own.  Nothing makes it better or worse.   States it is very mild, more so an annoyance than a pain.  No associated SOB, diaphoresis, nausea, vomiting, dizziness, weakness.  No known cardiac history.  Has uncle that had heart disease.  Husband also recently had an MI.  She is not a smoker.    History reviewed. No pertinent past medical history.  Patient Active Problem List   Diagnosis Date Noted  . Dermoid cyst of ovary (8 cm) 04/07/2015    Past Surgical History:  Procedure Laterality Date  . LAPAROSCOPIC UNILATERAL SALPINGO OOPHERECTOMY Left 05/31/2015   Procedure: LAPAROSCOPIC UNILATERAL SALPINGO OOPHORECTOMY;  Surgeon: Osborne Oman, MD;  Location: [facility];  Service: Gynecology;  Laterality: Left;  . TUBAL LIGATION    . UNILATERAL SALPINGECTOMY Right 05/31/2015   Procedure: UNILATERAL SALPINGECTOMY;  Surgeon: Osborne Oman, MD;  Location: [facility];  Service: Gynecology;  Laterality: Right;     OB History    Gravida  2   Para  2   Term  2   Preterm      AB      Living  2     SAB      TAB      Ectopic      Multiple      Live Births               Home Medications    Prior to Admission medications   Medication Sig Start Date End Date Taking? Authorizing Provider  aspirin EC 81 MG tablet Take 81 mg by mouth 3 (three) times a week.     [provider]  azithromycin  (ZITHROMAX) 250 MG tablet Take as directed: Two pills by mouth the first day, then one pill every day until completed Patient not taking: Reported on 06/20/2015 05/31/15   Anyanwu, Sallyanne Havers, MD  benzonatate (TESSALON) 100 MG capsule Take 1 capsule (100 mg total) by mouth every 8 (eight) hours. Patient not taking: Reported on 09/16/2015 06/23/15   Sam, Olivia Canter, PA-C  dicyclomine (BENTYL) 20 MG tablet Take 1 tablet (20 mg total) by mouth 2 (two) times daily. 03/08/16   Palumbo, April, MD  docusate sodium (COLACE) 100 MG capsule Take 1 capsule (100 mg total) by mouth 2 (two) times daily as needed. Patient not taking: Reported on 06/20/2015 05/31/15   Anyanwu, Sallyanne Havers, MD  fluticasone (FLONASE) 50 MCG/ACT nasal spray Place 2 sprays into both nostrils daily. Patient not taking: Reported on 09/16/2015 06/23/15   Sam, Olivia Canter, PA-C  GARLIC OIL PO Take 2 tablets by mouth 3 (three) times a week.    [provider]  ibuprofen (ADVIL,MOTRIN) 800 MG tablet Take 1 tablet (800 mg total) by mouth 3 (three) times daily with meals as needed for headache or moderate pain.  Patient not taking: Reported on 06/20/2015 05/31/15   Anyanwu, Sallyanne Havers, MD  ibuprofen (ADVIL,MOTRIN) 800 MG tablet Take 1 tablet (800 mg total) by mouth 3 (three) times daily. Patient not taking: Reported on 09/16/2015 06/23/15   Sam, Olivia Canter, PA-C  Misc Natural Products (APPLE CIDER VINEGAR) TABS Take 2 tablets by mouth 3 (three) times a week.    [provider]  oxyCODONE-acetaminophen (PERCOCET/ROXICET) 5-325 MG tablet Take 1-2 tablets by mouth every 6 (six) hours as needed. Patient not taking: Reported on 06/20/2015 05/31/15   Osborne Oman, MD  polyethylene glycol (MIRALAX) packet Take 17 g by mouth daily. 03/29/16   Charlann Lange, PA-C  predniSONE (STERAPRED UNI-PAK 21 TAB) 5 MG (21) TBPK tablet Take as directed 05/10/17   Bjorn Pippin, PA-C  promethazine (PHENERGAN) 25 MG tablet Take 1 tablet (25 mg total) by mouth every 6 (six)  hours as needed for nausea or vomiting. Patient not taking: Reported on 06/20/2015 05/31/15   Anyanwu, Sallyanne Havers, MD  pyrithione zinc (SELSUN BLUE DRY SCALP) 1 % shampoo Daily shampoo for the next week followed by 3 times a week as needed for persistent symptoms. 04/25/17   Zigmund Gottron, NP  Simethicone (GAS-X PO) Take 1-2 tablets by mouth daily as needed (gas).    [provider]    Family History Family History  Problem Relation Age of Onset  . Diabetes Other     Social History Social History   Tobacco Use  . Smoking status: Never Smoker  . Smokeless tobacco: Never Used  Substance Use Topics  . Alcohol use: No  . Drug use: No     Allergies   Patient has no known allergies.   Review of Systems Review of Systems  Cardiovascular: Positive for chest pain.  All other systems reviewed and are negative.    Physical Exam Updated Vital Signs BP 135/81 (BP Location: Right Arm)   Pulse 60   Temp 97.8 F (36.6 C) (Oral)   Resp 18   Ht 5\' 9"  (1.753 m)   Wt 81.6 kg (180 lb)   SpO2 100%   BMI 26.58 kg/m   Physical Exam  Constitutional: She is oriented to person, place, and time. She appears well-developed and well-nourished.  HENT:  Head: Normocephalic and atraumatic.  Mouth/Throat: Oropharynx is clear and moist.  Eyes: Pupils are equal, round, and reactive to light. Conjunctivae and EOM are normal.  Neck: Normal range of motion.  Cardiovascular: Normal rate and regular rhythm.  Murmur heard.  Systolic murmur is present. Pulmonary/Chest: Effort normal and breath sounds normal. She has no decreased breath sounds. She has no wheezes.  Abdominal: Soft. Bowel sounds are normal.  Musculoskeletal: Normal range of motion.  Neurological: She is alert and oriented to person, place, and time.  Skin: Skin is warm and dry.  Psychiatric: She has a normal mood and affect.  Nursing note and vitals reviewed.    ED Treatments / Results  Labs (all labs ordered are  listed, but only abnormal results are displayed) Labs Reviewed  BASIC METABOLIC PANEL - Abnormal; Notable for the following components:      Result Value   Glucose, Bld 138 (*)    BUN 23 (*)    All other components within normal limits  CBC - Abnormal; Notable for the following components:   RBC 3.81 (*)    All other components within normal limits  I-STAT TROPONIN, ED    EKG EKG Interpretation  Date/Time:  Thursday September 05 2017 03:17:18 EDT Ventricular Rate:  63 PR Interval:    QRS Duration: 139 QT Interval:  446 QTC Calculation: 457 R Axis:   93 Text Interpretation:  Sinus or ectopic atrial rhythm Prolonged PR interval RBBB and LPFB No significant change was found Confirmed by Jola Schmidt [id number]) on 09/05/2017 3:54:39 AM   Radiology Dg Chest 2 View  Result Date: 09/04/2017 CLINICAL DATA:  Chest pain for 2 weeks. EXAM: CHEST - 2 VIEW COMPARISON:  Chest radiograph 03/08/2016 FINDINGS: The heart size and mediastinal contours are within normal limits. Both lungs are clear. The visualized skeletal structures are unremarkable. IMPRESSION: No active cardiopulmonary disease. Electronically Signed   By: Ulyses Jarred M.D.   On: 09/04/2017 23:16    Procedures Procedures (including critical care time)  Medications Ordered in ED Medications - No data to display   Initial Impression / Assessment and Plan / ED Course  I have reviewed the triage vital signs and the nursing notes.  Pertinent labs & imaging results that were available during my care of the patient were reviewed by me and considered in my medical decision making (see chart for details).  52 year old female here with chest pain.  Has been intermittent for 2 weeks, random in onset, mild in nature, described as a dull ache.  This resolved spontaneously.  She has no associated shortness of breath, diaphoresis, nausea, or vomiting.  She has no known cardiac history.  EKG is nonischemic.  Labs overall reassuring.  Chest x-ray  is clear.  On exam patient was noted to have systolic murmur, she has never been told about this in the past.  She has not had any lightheadedness, syncopal events, exertional chest pain, or exertional dyspnea.  At this time, symptoms are atypical for ACS, PE, dissection, acute cardiac event and given negative work-up I feel this is less likely.  She will be referred to cardiology for further work-up regarding her murmur, likely echocardiogram.  She can also follow-up with her primary care doctor.  Discussed plan with patient, she acknowledged understanding and agreed with plan of care.  Return precautions given for new or worsening symptoms.  Final Clinical Impressions(s) / ED Diagnoses   Final diagnoses:  Chest pain in adult    ED Discharge Orders    None       Larene Pickett, PA-C 09/05/17 0076    Jola Schmidt, MD 09/05/17 [phone]

## 2017-09-05 NOTE — Discharge Instructions (Signed)
Your cardiac testing today was normal, however you were found to have a heart murmur on exam. We recommend you follow-up with cardiology, likely will need to have echocardiogram done.  Call office to make appt. Return to the ED for new or worsening symptoms.

## 2017-11-18 ENCOUNTER — Encounter (HOSPITAL_COMMUNITY): Payer: Self-pay | Admitting: Emergency Medicine

## 2017-11-18 ENCOUNTER — Ambulatory Visit (HOSPITAL_COMMUNITY)
Admission: EM | Admit: 2017-11-18 | Discharge: 2017-11-18 | Disposition: A | Discharge status: Home / Self Care | Payer: Self-pay | Attending: Family Medicine | Admitting: Family Medicine

## 2017-11-18 DIAGNOSIS — Z79899 Other long term (current) drug therapy: Secondary | ICD-10-CM | POA: Insufficient documentation

## 2017-11-18 DIAGNOSIS — R110 Nausea: Secondary | ICD-10-CM

## 2017-11-18 DIAGNOSIS — K5900 Constipation, unspecified: Secondary | ICD-10-CM | POA: Insufficient documentation

## 2017-11-18 DIAGNOSIS — Z888 Allergy status to other drugs, medicaments and biological substances status: Secondary | ICD-10-CM | POA: Insufficient documentation

## 2017-11-18 DIAGNOSIS — M545 Low back pain, unspecified: Secondary | ICD-10-CM

## 2017-11-18 DIAGNOSIS — B9689 Other specified bacterial agents as the cause of diseases classified elsewhere: Secondary | ICD-10-CM | POA: Insufficient documentation

## 2017-11-18 DIAGNOSIS — N390 Urinary tract infection, site not specified: Secondary | ICD-10-CM | POA: Insufficient documentation

## 2017-11-18 DIAGNOSIS — Z791 Long term (current) use of non-steroidal anti-inflammatories (NSAID): Secondary | ICD-10-CM | POA: Insufficient documentation

## 2017-11-18 DIAGNOSIS — R350 Frequency of micturition: Secondary | ICD-10-CM | POA: Insufficient documentation

## 2017-11-18 LAB — POCT URINALYSIS DIP (DEVICE)
Bilirubin Urine: NEGATIVE
Glucose, UA: NEGATIVE mg/dL
HGB URINE DIPSTICK: NEGATIVE
KETONES UR: NEGATIVE mg/dL
Leukocytes, UA: NEGATIVE
Nitrite: NEGATIVE
PH: 6.5 (ref 5.0–8.0)
PROTEIN: NEGATIVE mg/dL
SPECIFIC GRAVITY, URINE: 1.02 (ref 1.005–1.030)
Urobilinogen, UA: 0.2 mg/dL (ref 0.0–1.0)

## 2017-11-18 MED ORDER — NITROFURANTOIN MONOHYD MACRO 100 MG PO CAPS: 100.0000 mg | ORAL_CAPSULE | Freq: Two times a day (BID) | ORAL | 0 refills | Status: DC

## 2017-11-18 MED ORDER — ONDANSETRON HCL 4 MG/2ML IJ SOLN
4.0000 mg | Freq: Once | INTRAMUSCULAR | Status: AC
  Administered 2017-11-18: 4 mg via INTRAMUSCULAR

## 2017-11-18 MED ORDER — KETOROLAC TROMETHAMINE 30 MG/ML IJ SOLN
30.0000 mg | Freq: Once | INTRAMUSCULAR | Status: AC
  Administered 2017-11-18: 30 mg via INTRAMUSCULAR

## 2017-11-18 MED ORDER — KETOROLAC TROMETHAMINE 30 MG/ML IJ SOLN
INTRAMUSCULAR | Status: AC
  Filled 2017-11-18: qty 1

## 2017-11-18 MED ORDER — ONDANSETRON HCL 4 MG/2ML IJ SOLN
INTRAMUSCULAR | Status: AC
  Filled 2017-11-18: qty 2

## 2017-11-18 MED FILL — NITROFURANTOIN MONO-MCR 100: 100 | 5 days supply | Qty: 10 | Fill #0

## 2017-11-18 NOTE — ED Provider Notes (Signed)
[place]    CSN: [id number] Arrival date & time: 11/18/17  1216     History   Chief Complaint Chief Complaint  Patient presents with  . Back Pain  . Urinary Tract Infection    HPI Madison Walker is a 52 y.o. female.   She is a 52 year old female that presents with mid lower back discomfort, urinary frequency and pressure with urination x4 days.  Symptoms have been constant and remain the same.  Describes the back pain is achy. she has had slight nausea.  Patient reporting history of pyonephritis.  She does suffer from constipation from time to time.  She reports a normal bowel movement this a.m. she has not had any diarrhea or vomiting.  She denies any associated fever, chills, night sweats, fatigue.  She denies any injury to the back.  She denies any abdominal pain, pelvic pain, dysuria, hematuria, vaginal bleeding but does have some slight vaginal discharge.  Reports she did have sexual intercourse with a former partner, unprotected and is unsure about STDs.  She does not have any menstrual cycle.   ROS per HPI      History reviewed. No pertinent past medical history.  Patient Active Problem List   Diagnosis Date Noted  . Dermoid cyst of ovary (8 cm) 04/07/2015    Past Surgical History:  Procedure Laterality Date  . LAPAROSCOPIC UNILATERAL SALPINGO OOPHERECTOMY Left 05/31/2015   Procedure: LAPAROSCOPIC UNILATERAL SALPINGO OOPHORECTOMY;  Surgeon: Osborne Oman, MD;  Location: [facility];  Service: Gynecology;  Laterality: Left;  . TUBAL LIGATION    . UNILATERAL SALPINGECTOMY Right 05/31/2015   Procedure: UNILATERAL SALPINGECTOMY;  Surgeon: Osborne Oman, MD;  Location: [facility];  Service: Gynecology;  Laterality: Right;    OB History    Gravida  2   Para  2   Term  2   Preterm      AB      Living  2     SAB      TAB      Ectopic      Multiple      Live Births               Home Medications    Prior to Admission medications     Medication Sig Start Date End Date Taking? Authorizing Provider  azithromycin (ZITHROMAX) 250 MG tablet Take as directed: Two pills by mouth the first day, then one pill every day until completed Patient not taking: Reported on 06/20/2015 05/31/15   Anyanwu, Sallyanne Havers, MD  benzonatate (TESSALON) 100 MG capsule Take 1 capsule (100 mg total) by mouth every 8 (eight) hours. Patient not taking: Reported on 09/16/2015 06/23/15   Sam, Olivia Canter, PA-C  dicyclomine (BENTYL) 20 MG tablet Take 1 tablet (20 mg total) by mouth 2 (two) times daily. Patient not taking: Reported on 09/05/2017 03/08/16   Palumbo, April, MD  docusate sodium (COLACE) 100 MG capsule Take 1 capsule (100 mg total) by mouth 2 (two) times daily as needed. Patient not taking: Reported on 06/20/2015 05/31/15   Anyanwu, Sallyanne Havers, MD  fluticasone (FLONASE) 50 MCG/ACT nasal spray Place 2 sprays into both nostrils daily. Patient not taking: Reported on 09/16/2015 06/23/15   Sam, Olivia Canter, PA-C  ibuprofen (ADVIL,MOTRIN) 800 MG tablet Take 1 tablet (800 mg total) by mouth 3 (three) times daily with meals as needed for headache or moderate pain. Patient not taking: Reported on 06/20/2015 05/31/15   Osborne Oman, MD  ibuprofen (ADVIL,MOTRIN) 800 MG tablet Take 1 tablet (800 mg total) by mouth 3 (three) times daily. Patient not taking: Reported on 09/16/2015 06/23/15   Sam, Olivia Canter, PA-C  nitrofurantoin, macrocrystal-monohydrate, (MACROBID) 100 MG capsule Take 1 capsule (100 mg total) by mouth 2 (two) times daily. 11/18/17   Loura Halt A, NP  oxyCODONE-acetaminophen (PERCOCET/ROXICET) 5-325 MG tablet Take 1-2 tablets by mouth every 6 (six) hours as needed. Patient not taking: Reported on 06/20/2015 05/31/15   Osborne Oman, MD  polyethylene glycol (MIRALAX) packet Take 17 g by mouth daily. Patient not taking: Reported on 09/05/2017 03/29/16   Charlann Lange, PA-C  predniSONE (STERAPRED UNI-PAK 21 TAB) 5 MG (21) TBPK tablet Take as directed Patient not taking:  Reported on 09/05/2017 05/10/17   Bjorn Pippin, PA-C  promethazine (PHENERGAN) 25 MG tablet Take 1 tablet (25 mg total) by mouth every 6 (six) hours as needed for nausea or vomiting. Patient not taking: Reported on 06/20/2015 05/31/15   Anyanwu, Sallyanne Havers, MD  pyrithione zinc (SELSUN BLUE DRY SCALP) 1 % shampoo Daily shampoo for the next week followed by 3 times a week as needed for persistent symptoms. Patient not taking: Reported on 09/05/2017 04/25/17   Zigmund Gottron, NP    Family History Family History  Problem Relation Age of Onset  . Diabetes Other     Social History Social History   Tobacco Use  . Smoking status: Never Smoker  . Smokeless tobacco: Never Used  Substance Use Topics  . Alcohol use: No  . Drug use: No     Allergies   Iodine   Review of Systems Review of Systems   Physical Exam Triage Vital Signs ED Triage Vitals  Enc Vitals Group     BP 11/18/17 1326 132/75     Pulse Rate 11/18/17 1326 60     Resp 11/18/17 1326 16     Temp 11/18/17 1326 98.1 F (36.7 C)     Temp Source 11/18/17 1326 Oral     SpO2 11/18/17 1326 100 %     Weight 11/18/17 1327 185 lb (83.9 kg)     Height --      Head Circumference --      Peak Flow --      Pain Score 11/18/17 1327 3     Pain Loc --      Pain Edu? --      Excl. in [place]? --    No data found.  Updated Vital Signs BP 132/75   Pulse 60   Temp 98.1 F (36.7 C) (Oral)   Resp 16   Wt 185 lb (83.9 kg)   SpO2 100%   BMI 27.32 kg/m   Visual Acuity Right Eye Distance:   Left Eye Distance:   Bilateral Distance:    Right Eye Near:   Left Eye Near:    Bilateral Near:     Physical Exam  Constitutional: She is oriented to person, place, and time. She appears well-developed and well-nourished.  HENT:  Head: Normocephalic and atraumatic.  Neck: Normal range of motion.  Pulmonary/Chest: Effort normal.  Abdominal: Soft. Bowel sounds are normal.  Abdomen soft, non tender. No CVA tenderness. No rebound  tenderness.   Musculoskeletal: Normal range of motion. She exhibits no edema, tenderness or deformity.  Non tender to lumbar spine or paravertebral muscles.  Neurological: She is alert and oriented to person, place, and time.  Skin: Skin is warm and dry. Capillary refill takes less than  2 seconds.  Psychiatric: She has a normal mood and affect.  Nursing note and vitals reviewed.    UC Treatments / Results  Labs (all labs ordered are listed, but only abnormal results are displayed) Labs Reviewed  URINE CULTURE  POCT URINALYSIS DIP (DEVICE)  URINE CYTOLOGY ANCILLARY ONLY    EKG None  Radiology No results found.  Procedures Procedures (including critical care time)  Medications Ordered in UC Medications  ketorolac (TORADOL) 30 MG/ML injection 30 mg (30 mg Intramuscular Given 11/18/17 1431)  ondansetron (ZOFRAN) injection 4 mg (4 mg Intramuscular Given 11/18/17 1432)    Initial Impression / Assessment and Plan / UC Course  I have reviewed the triage vital signs and the nursing notes.  Pertinent labs & imaging results that were available during my care of the patient were reviewed by me and considered in my medical decision making (see chart for details).    No acute abdomen on exam Non tender to lumbar spine and musculature. Pt is having urinary frequency, retention and pressure. She is also having achy lower back pain and nausea. VSS, a febrile and non toxic.  Possible kidney stone vs constipation vs STD vs UTI vs cystitis Urine negative for infection, will send for culture Also testing for STDs and infection Will try miralax daily for constipation.  Based on hx of pyelonephritis and symptoms will go ahead and treat for UTI pending the results. Pt reported that she had a negative urine in the past and then ended up hospitalized for pyelonephritis.  Symptoms could be from constipation or she could have an STD If tests come back negative and she is still having symptoms she  will need to follow up.  For worsening symptoms  she will need to go to the ER.  She may need urology follow up in the future.  Pt has no insurance and limited funds  so this may be difficult for her.    Final Clinical Impressions(s) / UC Diagnoses   Final diagnoses:  Acute midline low back pain without sciatica  Urinary frequency     Discharge Instructions     It was nice meeting you!!  We will go ahead and treat you for UTI based on hx and symptoms. We will send for culture.  We are also sending urine to test for STDs, bacterial vaginosis and yeast infection.  We will also treat you for constipation with miralax daily until you have a good bowel movement then you can decrease the dose.  Limit the sugary drinks and increase your water intake If you develop more severe pain, vomiting, fever, please go to the ER.       ED Prescriptions    Medication Sig Dispense Auth. Provider   nitrofurantoin, macrocrystal-monohydrate, (MACROBID) 100 MG capsule Take 1 capsule (100 mg total) by mouth 2 (two) times daily. 10 capsule Loura Halt A, NP     Controlled Substance Prescriptions [place] Controlled Substance Registry consulted? Not Applicable   Orvan July, NP 11/18/17 1432

## 2017-11-18 NOTE — Discharge Instructions (Addendum)
It was nice meeting you!!  We will go ahead and treat you for UTI based on hx and symptoms. We will send for culture.  We are also sending urine to test for STDs, bacterial vaginosis and yeast infection.  We will also treat you for constipation with miralax daily until you have a good bowel movement then you can decrease the dose.  Limit the sugary drinks and increase your water intake If you develop more severe pain, vomiting, fever, please go to the ER.

## 2017-11-18 NOTE — ED Triage Notes (Signed)
PT reports low back pain, urinary frequency, and pressure for 4 days.

## 2017-11-19 LAB — URINE CULTURE: CULTURE: NO GROWTH

## 2017-11-19 LAB — URINE CYTOLOGY ANCILLARY ONLY
Chlamydia: NEGATIVE
Neisseria Gonorrhea: NEGATIVE
TRICH (WINDOWPATH): NEGATIVE

## 2017-11-22 ENCOUNTER — Ambulatory Visit (INDEPENDENT_AMBULATORY_CARE_PROVIDER_SITE_OTHER): Payer: Self-pay | Admitting: Cardiovascular Disease

## 2017-11-22 ENCOUNTER — Encounter: Payer: Self-pay | Admitting: Cardiovascular Disease

## 2017-11-22 VITALS — BP 140/84 | HR 73 | Ht 69.0 in | Wt 188.8 lb

## 2017-11-22 DIAGNOSIS — Z0189 Encounter for other specified special examinations: Secondary | ICD-10-CM

## 2017-11-22 DIAGNOSIS — R0789 Other chest pain: Secondary | ICD-10-CM | POA: Insufficient documentation

## 2017-11-22 NOTE — Assessment & Plan Note (Signed)
Ms. Madison Walker was referred by the emergency room for atypical chest pain.  She was seen on 09/05/2017 and evaluated.  She ruled out for myocardial infarction.  Her symptoms had been present for the 2 weeks prior to that and occurred daily since that time.  Her pain lasts seconds to minutes at a time with occasional left upper extremity radiation.  The pain comes on randomly and resolves spontaneously.  She really has no risk factors.  I am going to get a routine GXT to further evaluate.

## 2017-11-22 NOTE — Progress Notes (Signed)
11/22/2017 Madison Walker   02/12/1966  [id number]  Primary Physician Patient, No Pcp Per Primary Cardiologist: Lorretta Harp MD Garret Reddish, [place], [place]  HPI:  Madison Walker is a 52 y.o. moderately overweight single Caucasian female mother of 2 children who is accompanied by her significant other Madison Walker.  She is referred by the emergency room for evaluation of atypical chest pain.  She does not have a primary care provider.  She presented to the ER 09/05/2017 with atypical chest pain.  She ruled out for myocardial infarction.  Work-up was essentially benign although a murmur was auscultated.  She had the pain for 2 weeks prior to the presentation and has had pain daily since that occurs once or twice a day, lasting minutes at a time occasional left upper extremity radiation.  There is no family history of heart disease.   Current Meds  Medication Sig  . polyethylene glycol (MIRALAX) packet Take 17 g by mouth daily.  Marland Kitchen pyrithione zinc (SELSUN BLUE DRY SCALP) 1 % shampoo Daily shampoo for the next week followed by 3 times a week as needed for persistent symptoms.     Allergies  Allergen Reactions  . Iodine Itching and Rash    Caused rash/hives after a surgery    Social History   Socioeconomic History  . Marital status: Divorced    Spouse name: Not on file  . Number of children: Not on file  . Years of education: Not on file  . Highest education level: Not on file  Occupational History  . Not on file  Social Needs  . Financial resource strain: Not on file  . Food insecurity:    Worry: Not on file    Inability: Not on file  . Transportation needs:    Medical: Not on file    Non-medical: Not on file  Tobacco Use  . Smoking status: Never Smoker  . Smokeless tobacco: Never Used  Substance and Sexual Activity  . Alcohol use: No  . Drug use: No  . Sexual activity: Not on file  Lifestyle  . Physical activity:    Days per week: Not on file    Minutes per session: Not on  file  . Stress: Not on file  Relationships  . Social connections:    Talks on phone: Not on file    Gets together: Not on file    Attends religious service: Not on file    Active member of club or organization: Not on file    Attends meetings of clubs or organizations: Not on file    Relationship status: Not on file  . Intimate partner violence:    Fear of current or ex partner: Not on file    Emotionally abused: Not on file    Physically abused: Not on file    Forced sexual activity: Not on file  Other Topics Concern  . Not on file  Social History Narrative  . Not on file     Review of Systems: General: negative for chills, fever, night sweats or weight changes.  Cardiovascular: negative for chest pain, dyspnea on exertion, edema, orthopnea, palpitations, paroxysmal nocturnal dyspnea or shortness of breath Dermatological: negative for rash Respiratory: negative for cough or wheezing Urologic: negative for hematuria Abdominal: negative for nausea, vomiting, diarrhea, bright red blood per rectum, melena, or hematemesis Neurologic: negative for visual changes, syncope, or dizziness All other systems reviewed and are otherwise negative except as noted above.    Blood pressure  140/84, pulse 73, height 5\' 9"  (1.753 m), weight 188 lb 12.8 oz (85.6 kg), SpO2 97 %.  General appearance: alert and no distress Neck: no adenopathy, no carotid bruit, no JVD, supple, symmetrical, trachea midline and thyroid not enlarged, symmetric, no tenderness/mass/nodules Lungs: clear to auscultation bilaterally Heart: regular rate and rhythm, S1, S2 normal, no murmur, click, rub or gallop Extremities: extremities normal, atraumatic, no cyanosis or edema Pulses: 2+ and symmetric Skin: Skin color, texture, turgor normal. No rashes or lesions Neurologic: Alert and oriented X 3, normal strength and tone. Normal symmetric reflexes. Normal coordination and gait  EKG not performed today  ASSESSMENT AND  PLAN:   Atypical chest pain Ms. Ficek was referred by the emergency room for atypical chest pain.  She was seen on 09/05/2017 and evaluated.  She ruled out for myocardial infarction.  Her symptoms had been present for the 2 weeks prior to that and occurred daily since that time.  Her pain lasts seconds to minutes at a time with occasional left upper extremity radiation.  The pain comes on randomly and resolves spontaneously.  She really has no risk factors.  I am going to get a routine GXT to further evaluate.      Lorretta Harp MD FACP,FACC,FAHA, [facility] 11/22/2017 8:25 AM

## 2017-11-22 NOTE — Patient Instructions (Signed)
Medication Instructions:  Your physician recommends that you continue on your current medications as directed. Please refer to the Current Medication list given to you today.  If you need a refill on your cardiac medications before your next appointment, please call your pharmacy.   Lab work: Your physician recommends that you return for lab work in: [employer] (lipid/liver)  If you have labs (blood work) drawn today and your tests are completely normal, you will receive your results only by: Marland Kitchen MyChart Message (if you have MyChart) OR . A paper copy in the mail If you have any lab test that is abnormal or we need to change your treatment, we will call you to review the results.  Testing/Procedures: Your physician has requested that you have an exercise tolerance test. For further information please visit [email or web address]. Please also follow instruction sheet, as given.    Follow-Up: At [facility], you and your health needs are our priority.  As part of our continuing mission to provide you with exceptional heart care, we have created designated Provider Care Teams.  These Care Teams include your primary Cardiologist (physician) and Advanced Practice Providers (APPs -  Physician Assistants and Nurse Practitioners) who all work together to provide you with the care you need, when you need it. You will need a follow up appointment in 6 months with extender.  Please call our office 2 months in advance to schedule this appointment.  You may see Dr. Gwenlyn Found or one of the following Advanced Practice Providers on your designated Care Team:   Kerin Ransom, PA-C Roby Lofts, [place] . Sande Rives, PA-C  Any Other Special Instructions Will Be Listed Below (If Applicable).

## 2017-11-25 LAB — URINE CYTOLOGY ANCILLARY ONLY

## 2017-11-27 ENCOUNTER — Telehealth (HOSPITAL_COMMUNITY): Payer: Self-pay

## 2017-11-27 NOTE — Telephone Encounter (Signed)
Encounter complete. 

## 2017-11-29 ENCOUNTER — Ambulatory Visit (HOSPITAL_COMMUNITY)
Admission: RE | Admit: 2017-11-29 | Discharge: 2017-11-29 | Disposition: A | Discharge status: Home / Self Care | Payer: Self-pay | Source: Ambulatory Visit | Attending: Cardiovascular Disease | Admitting: Cardiovascular Disease

## 2017-11-29 DIAGNOSIS — R0789 Other chest pain: Secondary | ICD-10-CM | POA: Insufficient documentation

## 2017-11-29 LAB — EXERCISE TOLERANCE TEST
CHL CUP MPHR: 166 {beats}/min
CHL CUP RESTING HR STRESS: 71 {beats}/min
CHL RATE OF PERCEIVED EXERTION: 17
CSEPEDS: 56 s
CSEPHR: 96 %
Estimated workload: 8.3 METS
Exercise duration (min): 6 min
Peak HR: 160 {beats}/min

## 2017-11-30 LAB — HEPATIC FUNCTION PANEL
ALT: 16 IU/L (ref 0–32)
AST: 17 IU/L (ref 0–40)
Albumin: 4.7 g/dL (ref 3.5–5.5)
Alkaline Phosphatase: 97 IU/L (ref 39–117)
Bilirubin Total: 0.5 mg/dL (ref 0.0–1.2)
Bilirubin, Direct: 0.17 mg/dL (ref 0.00–0.40)
TOTAL PROTEIN: 7.2 g/dL (ref 6.0–8.5)

## 2017-11-30 LAB — LIPID PANEL
CHOL/HDL RATIO: 3.4 ratio (ref 0.0–4.4)
Cholesterol, Total: 152 mg/dL (ref 100–199)
HDL: 45 mg/dL (ref >39)
LDL CALC: 94 mg/dL (ref 0–99)
TRIGLYCERIDES: 63 mg/dL (ref 0–149)
VLDL CHOLESTEROL CAL: 13 mg/dL (ref 5–40)

## 2017-12-02 ENCOUNTER — Telehealth: Payer: Self-pay | Admitting: Cardiovascular Disease

## 2017-12-02 ENCOUNTER — Encounter: Payer: Self-pay | Admitting: *Deleted

## 2017-12-02 NOTE — Telephone Encounter (Signed)
Follow Up:     Pt would like to know if her Stress test and lab results are back from Friday please.

## 2017-12-02 NOTE — Telephone Encounter (Signed)
Called patient advised of note from the stress test and lab results. Patient wanted to clarify and know what the next steps were . I advised to monitor any symptoms at home and to call if anything worsened. Patient verbalized understanding.

## 2018-06-18 ENCOUNTER — Other Ambulatory Visit: Payer: Self-pay

## 2018-06-18 ENCOUNTER — Telehealth: Payer: Self-pay | Admitting: Cardiology

## 2018-06-18 NOTE — Telephone Encounter (Signed)
Smartphone/ my chart/ consent/ pre reg completed °

## 2018-06-19 ENCOUNTER — Telehealth: Payer: Self-pay

## 2018-06-19 ENCOUNTER — Encounter: Payer: Self-pay | Admitting: Cardiology

## 2018-06-19 ENCOUNTER — Telehealth (INDEPENDENT_AMBULATORY_CARE_PROVIDER_SITE_OTHER): Payer: Self-pay | Admitting: Cardiology

## 2018-06-19 VITALS — Ht 69.0 in | Wt 190.0 lb

## 2018-06-19 DIAGNOSIS — R0789 Other chest pain: Secondary | ICD-10-CM

## 2018-06-19 DIAGNOSIS — Z8249 Family history of ischemic heart disease and other diseases of the circulatory system: Secondary | ICD-10-CM | POA: Insufficient documentation

## 2018-06-19 NOTE — Progress Notes (Signed)
Virtual Visit via Telephone Note   This visit type was conducted due to national recommendations for restrictions regarding the COVID-19 Pandemic (e.g. social distancing) in an effort to limit this patient's exposure and mitigate transmission in our community.  Due to her co-morbid illnesses, this patient is at least at moderate risk for complications without adequate follow up.  This format is felt to be most appropriate for this patient at this time.  The patient did not have access to video technology/had technical difficulties with video requiring transitioning to audio format only (telephone).  All issues noted in this document were discussed and addressed.  No physical exam could be performed with this format.  Please refer to the patient's chart for her  consent to telehealth for [facility].   Date:  06/19/2018   ID:  Madison Walker, DOB 15-Jan-1966, MRN [id number]  Patient Location: Home Provider Location: Home  PCP:  Patient, No Pcp Per  Cardiologist:  Quay Burow, MD  Electrophysiologist:  None   Evaluation Performed:  Follow-Up Visit  Chief Complaint:  none  History of Present Illness:    Madison Walker is a 53 y.o. female with a history of the chest pain in July 2019 which prompted an emergency room visit.  Her symptoms were atypical for angina.  She ruled out for an MI.  She saw Dr. Gwenlyn Found in the office for further evaluation.  Exercise treadmill done in October 2019 was negative.  She went 6 minutes on the Bruce protocol achieving 8.3 METs and 85% of her age predicted maximum heart rate.   The patient's brother had a heart attack in his 15s.  The patient's husband has coronary disease and is followed by Dr. Irish Lack.  Patient has no history of diabetes, hypertension, or smoking.  Her LDL was 94. She does not have a PCP.  She does not exercise on a regular basis.  Because of her husband's history, and her brother's history she is concerned she may have coronary disease.   On interview today she denied having any chest pain.  I had a long discussion with the patient about possible further evaluation if she were to have recurrent symptoms.  I discussed coronary calcium scoring with her but in light of the fact that she currently has no symptoms, she had a negative treadmill, and she has no insurance, I told her I am not sure that that test was indicated.  I have asked her to come back in a few months when the office opens up again to talk about this further with Dr. Gwenlyn Found.  She'll let us know if she has any exertional chest discomfort or dyspnea.  I have also advised the patient to arrange for a PCP and urged her to start a daily walking program for exercise.    The patient does not have symptoms concerning for COVID-19 infection (fever, chills, cough, or new shortness of breath).    No past medical history on file. Past Surgical History:  Procedure Laterality Date  . LAPAROSCOPIC UNILATERAL SALPINGO OOPHERECTOMY Left 05/31/2015   Procedure: LAPAROSCOPIC UNILATERAL SALPINGO OOPHORECTOMY;  Surgeon: Osborne Oman, MD;  Location: [facility];  Service: Gynecology;  Laterality: Left;  . TUBAL LIGATION    . UNILATERAL SALPINGECTOMY Right 05/31/2015   Procedure: UNILATERAL SALPINGECTOMY;  Surgeon: Osborne Oman, MD;  Location: [facility];  Service: Gynecology;  Laterality: Right;     Current Meds  Medication Sig  . Bioflavonoid Products (ESTER C PO) Take by mouth. Take  2 times a week  . NON FORMULARY Takes 2-3 times weekly  . polyethylene glycol (MIRALAX) packet Take 17 g by mouth daily.     Allergies:   Iodine   Social History   Tobacco Use  . Smoking status: Never Smoker  . Smokeless tobacco: Never Used  Substance Use Topics  . Alcohol use: No  . Drug use: No     Family Hx: The patient's family history includes Diabetes in an other family member.  ROS:   Please see the history of present illness.    All other systems reviewed and are negative.   Prior  CV studies:   The following studies were reviewed today:  GXT Oct 2019  Labs/Other Tests and Data Reviewed:    EKG:  An ECG dated 09/06/2018  was personally reviewed today and demonstrated:  NSR, RBBB, LPFB  Recent Labs: 09/04/2017: BUN 23; Creatinine, Ser 0.92; Hemoglobin 12.8; Platelets 200; Potassium 3.8; Sodium 143 11/29/2017: ALT 16   Recent Lipid Panel Lab Results  Component Value Date/Time   CHOL 152 11/29/2017 03:02 PM   TRIG 63 11/29/2017 03:02 PM   HDL 45 11/29/2017 03:02 PM   CHOLHDL 3.4 11/29/2017 03:02 PM   LDLCALC 94 11/29/2017 03:02 PM    Wt Readings from Last 3 Encounters:  06/19/18 190 lb (86.2 kg)  11/22/17 188 lb 12.8 oz (85.6 kg)  11/18/17 185 lb (83.9 kg)     Objective:    Vital Signs:  Ht 5\' 9"  (1.753 m)   Wt 190 lb (86.2 kg)   BMI 28.06 kg/m   Pt in no distress on the phone VITAL SIGNS:  reviewed  ASSESSMENT & PLAN:    H/O atypical chest pain- July 2019- negative GXT Oct 2019. No recurrent symptoms.  FM Hx- Pt's brother had an MI in his 68's   COVID-19 Education: The signs and symptoms of COVID-19 were discussed with the patient and how to seek care for testing (follow up with PCP or arrange E-visit).  The importance of social distancing was discussed today.  Time:   Today, I have spent 15 minutes with the patient with telehealth technology discussing the above problems.     Medication Adjustments/Labs and Tests Ordered: Current medicines are reviewed at length with the patient today.  Concerns regarding medicines are outlined above.   Tests Ordered: No orders of the defined types were placed in this encounter.   Medication Changes: No orders of the defined types were placed in this encounter.   Disposition:  Follow up Dr Gwenlyn Found in August to discuss if further testing is indicated.  She'll let us know if she has any exertional chest discomfort or dyspnea.  I have also advised the patient to arrange for a PCP and urged her to start a  daily walking program for exercise.    Signed, Kerin Ransom, PA-C  06/19/2018 8:25 AM    [facility]

## 2018-06-19 NOTE — Telephone Encounter (Signed)
Contacted patient to discuss AVS instructions. Patient agreeable with Luke recommendations and voiced understanding. 

## 2018-06-19 NOTE — Patient Instructions (Signed)
Medication Instructions:  Your physician recommends that you continue on your current medications as directed. Please refer to the Current Medication list given to you today. If you need a refill on your cardiac medications before your next appointment, please call your pharmacy.   Lab work: None  If you have labs (blood work) drawn today and your tests are completely normal, you will receive your results only by: Marland Kitchen MyChart Message (if you have MyChart) OR . A paper copy in the mail If you have any lab test that is abnormal or we need to change your treatment, we will call you to review the results.  Testing/Procedures: None   Follow-Up: At [facility], you and your health needs are our priority.  As part of our continuing mission to provide you with exceptional heart care, we have created designated Provider Care Teams.  These Care Teams include your primary Cardiologist (physician) and Advanced Practice Providers (APPs -  Physician Assistants and Nurse Practitioners) who all work together to provide you with the care you need, when you need it. You will need a follow up appointment in 3-4 months. You may see Quay Burow, MD or one of the following Advanced Practice Providers on your designated Care Team:   Kerin Ransom, PA-C Roby Lofts, [place] . Sande Rives, PA-C  Any Other Special Instructions Will Be Listed Below (If Applicable).

## 2018-09-30 ENCOUNTER — Telehealth: Payer: Self-pay

## 2018-09-30 ENCOUNTER — Encounter: Payer: Self-pay | Admitting: Cardiovascular Disease

## 2018-09-30 ENCOUNTER — Ambulatory Visit (INDEPENDENT_AMBULATORY_CARE_PROVIDER_SITE_OTHER): Payer: Self-pay | Admitting: Cardiovascular Disease

## 2018-09-30 ENCOUNTER — Other Ambulatory Visit: Payer: Self-pay

## 2018-09-30 ENCOUNTER — Telehealth: Payer: Self-pay | Admitting: *Deleted

## 2018-09-30 VITALS — BP 133/83 | HR 65 | Ht 68.0 in | Wt 183.2 lb

## 2018-09-30 DIAGNOSIS — Z8249 Family history of ischemic heart disease and other diseases of the circulatory system: Secondary | ICD-10-CM

## 2018-09-30 DIAGNOSIS — Z008 Encounter for other general examination: Secondary | ICD-10-CM

## 2018-09-30 DIAGNOSIS — Z01812 Encounter for preprocedural laboratory examination: Secondary | ICD-10-CM

## 2018-09-30 DIAGNOSIS — I4510 Unspecified right bundle-branch block: Secondary | ICD-10-CM | POA: Insufficient documentation

## 2018-09-30 NOTE — Telephone Encounter (Signed)
Left message for patient to call and schedule Calcium scoring that was ordered by Dr. Gwenlyn Found

## 2018-09-30 NOTE — Assessment & Plan Note (Signed)
Her brother has had coronary stenting

## 2018-09-30 NOTE — Telephone Encounter (Signed)
Spoke with pt and informed that lab work orders (cbc, bmp) cancelled and no need for labs with coronary calcium scan ordered. Pt states she will call office to update on whether she wants to proceed with scan d/t OOP cost

## 2018-09-30 NOTE — Assessment & Plan Note (Signed)
Chronic. 

## 2018-09-30 NOTE — Progress Notes (Signed)
09/30/2018 [name]   1965-11-13  [id number]  Primary Physician Patient, No Pcp Per Primary Cardiologist: Lorretta Harp MD FACP, [place], [place], [place]  HPI:  Madison Walker is a 53 y.o.  moderately overweight single Caucasian female mother of 2 children who I last saw in the office 11/22/2017.  She was referred by the emergency room for evaluation of atypical chest pain.  She does not have a primary care provider.  She presented to the ER 09/05/2017 with atypical chest pain.  She ruled out for myocardial infarction.  Work-up was essentially benign although a murmur was auscultated.  She had the pain for 2 weeks prior to the presentation and has had pain daily since that occurs once or twice a day, lasting minutes at a time occasional left upper extremity radiation.  Her brother apparently had coronary stenting.  I perform exercise stress testing on her 11/29/2017 which was entirely normal..  Since I saw her a year ago she is done well.  She gets occasional infrequent atypical chest pain which sounds more musculoskeletal.  She does not smoke.  She does have a chronic right bundle branch block.  Current Meds  Medication Sig  . Bioflavonoid Products (ESTER C PO) Take by mouth. Take 2 times a week  . polyethylene glycol (MIRALAX) packet Take 17 g by mouth daily.     Allergies  Allergen Reactions  . Iodine Itching and Rash    Caused rash/hives after a surgery    Social History   Socioeconomic History  . Marital status: Divorced    Spouse name: Not on file  . Number of children: Not on file  . Years of education: Not on file  . Highest education level: Not on file  Occupational History  . Not on file  Social Needs  . Financial resource strain: Not on file  . Food insecurity    Worry: Not on file    Inability: Not on file  . Transportation needs    Medical: Not on file    Non-medical: Not on file  Tobacco Use  . Smoking status: Never Smoker  . Smokeless tobacco: Never Used   Substance and Sexual Activity  . Alcohol use: No  . Drug use: No  . Sexual activity: Not on file  Lifestyle  . Physical activity    Days per week: Not on file    Minutes per session: Not on file  . Stress: Not on file  Relationships  . Social Herbalist on phone: Not on file    Gets together: Not on file    Attends religious service: Not on file    Active member of club or organization: Not on file    Attends meetings of clubs or organizations: Not on file    Relationship status: Not on file  . Intimate partner violence    Fear of current or ex partner: Not on file    Emotionally abused: Not on file    Physically abused: Not on file    Forced sexual activity: Not on file  Other Topics Concern  . Not on file  Social History Narrative  . Not on file     Review of Systems: General: negative for chills, fever, night sweats or weight changes.  Cardiovascular: negative for chest pain, dyspnea on exertion, edema, orthopnea, palpitations, paroxysmal nocturnal dyspnea or shortness of breath Dermatological: negative for rash Respiratory: negative for cough or wheezing Urologic: negative for hematuria Abdominal: negative  for nausea, vomiting, diarrhea, bright red blood per rectum, melena, or hematemesis Neurologic: negative for visual changes, syncope, or dizziness All other systems reviewed and are otherwise negative except as noted above.    Blood pressure 133/83, pulse 65, height 5\' 8"  (1.727 m), weight 183 lb 3.2 oz (83.1 kg), SpO2 98 %.  General appearance: alert and no distress Neck: no adenopathy, no carotid bruit, no JVD, supple, symmetrical, trachea midline and thyroid not enlarged, symmetric, no tenderness/mass/nodules Lungs: clear to auscultation bilaterally Heart: regular rate and rhythm, S1, S2 normal, no murmur, click, rub or gallop Extremities: extremities normal, atraumatic, no cyanosis or edema Pulses: 2+ and symmetric Skin: Skin color, texture, turgor  normal. No rashes or lesions Neurologic: Alert and oriented X 3, normal strength and tone. Normal symmetric reflexes. Normal coordination and gait  EKG sinus rhythm at 65 with right bundle branch block.  I personally reviewed this EKG.  ASSESSMENT AND PLAN:   Right bundle branch block Chronic  Family history of heart disease Her brother has had coronary stenting  Atypical chest pain History of atypical chest pain with negative GXT 12/03/2018.  She said no recurrent symptoms.      Lorretta Harp MD FACP,FACC,FAHA, [facility] 09/30/2018 9:18 AM

## 2018-09-30 NOTE — Assessment & Plan Note (Signed)
History of atypical chest pain with negative GXT 12/03/2018.  She said no recurrent symptoms.

## 2018-09-30 NOTE — Patient Instructions (Signed)
Medication Instructions:  Your physician recommends that you continue on your current medications as directed. Please refer to the Current Medication list given to you today.  If you need a refill on your cardiac medications before your next appointment, please call your pharmacy.   Lab work: Your physician recommends that you return for lab work today: [place], BASIC METABOLIC PANEL  If you have labs (blood work) drawn today and your tests are completely normal, you will receive your results only by: Marland Kitchen MyChart Message (if you have MyChart) OR . A paper copy in the mail If you have any lab test that is abnormal or we need to change your treatment, we will call you to review the results.  Testing/Procedures: Your physician has recommended that you have a coronary calcium scan. This will cost $150 out-of-pocket.  Coronary Calcium Scan A coronary calcium scan is an imaging test used to look for deposits of calcium and other fatty materials (plaques) in the inner lining of the blood vessels of the heart (coronary arteries). These deposits of calcium and plaques can partly clog and narrow the coronary arteries without producing any symptoms or warning signs. This puts a person at risk for a heart attack. This test can detect these deposits before symptoms develop. Tell a health care provider about:  Any allergies you have.  All medicines you are taking, including vitamins, herbs, eye drops, creams, and over-the-counter medicines.  Any problems you or family members have had with anesthetic medicines.  Any blood disorders you have.  Any surgeries you have had.  Any medical conditions you have.  Whether you are pregnant or may be pregnant. What are the risks? Generally, this is a safe procedure. However, problems may occur, including:  Harm to a pregnant woman and her unborn baby. This test involves the use of radiation. Radiation exposure can be dangerous to a pregnant  woman and her unborn baby. If you are pregnant, you generally should not have this procedure done.  Slight increase in the risk of cancer. This is because of the radiation involved in the test. What happens before the procedure? No preparation is needed for this procedure. What happens during the procedure?   You will undress and remove any jewelry around your neck or chest.  You will put on a hospital gown.  Sticky electrodes will be placed on your chest. The electrodes will be connected to an electrocardiogram (ECG) machine to record a tracing of the electrical activity of your heart.  A CT scanner will take pictures of your heart. During this time, you will be asked to lie still and hold your breath for 2-3 seconds while a picture of your heart is being taken. The procedure may vary among health care providers and hospitals. What happens after the procedure?  You can get dressed.  You can return to your normal activities.  It is up to you to get the results of your test. Ask your health care provider, or the department that is doing the test, when your results will be ready. Summary  A coronary calcium scan is an imaging test used to look for deposits of calcium and other fatty materials (plaques) in the inner lining of the blood vessels of the heart (coronary arteries).  Generally, this is a safe procedure. Tell your health care provider if you are pregnant or may be pregnant.  No preparation is needed for this procedure.  A CT scanner will take pictures of your heart.  You can return to your normal activities after the scan is done. This information is not intended to replace advice given to you by your health care provider. Make sure you discuss any questions you have with your health care provider.  Follow-Up: At [facility], you and your health needs are our priority.  As part of our continuing mission to provide you with exceptional heart care, we have created  designated Provider Care Teams.  These Care Teams include your primary Cardiologist (physician) and Advanced Practice Providers (APPs -  Physician Assistants and Nurse Practitioners) who all work together to provide you with the care you need, when you need it. . You MAY SCHEDULE a follow up appointment AS NEEDED.  You may see Dr. Gwenlyn Found or one of the following Advanced Practice Providers on your designated Care Team:   . Kerin Ransom, PA-C . Daleen Snook Kroeger, PA-C . Sande Rives, PA-C . Almyra Deforest, PA-C . Fabian Sharp, PA-C . Jory Sims, DNP . Rosaria Ferries, PA-C

## 2018-12-19 ENCOUNTER — Telehealth: Payer: Self-pay | Admitting: *Deleted

## 2018-12-19 NOTE — Telephone Encounter (Signed)
Called patient to schedule CT CA Score patient doe snot want to precede with CT at this time.

## 2019-05-16 IMAGING — CR DG CHEST 2V
2 series · 2 of 2 positions shown · non-contrast
Comparison: Chest radiograph 03/08/2016

CLINICAL DATA: Chest pain for 2 weeks.

EXAM:
CHEST - 2 VIEW

[w chest pa]
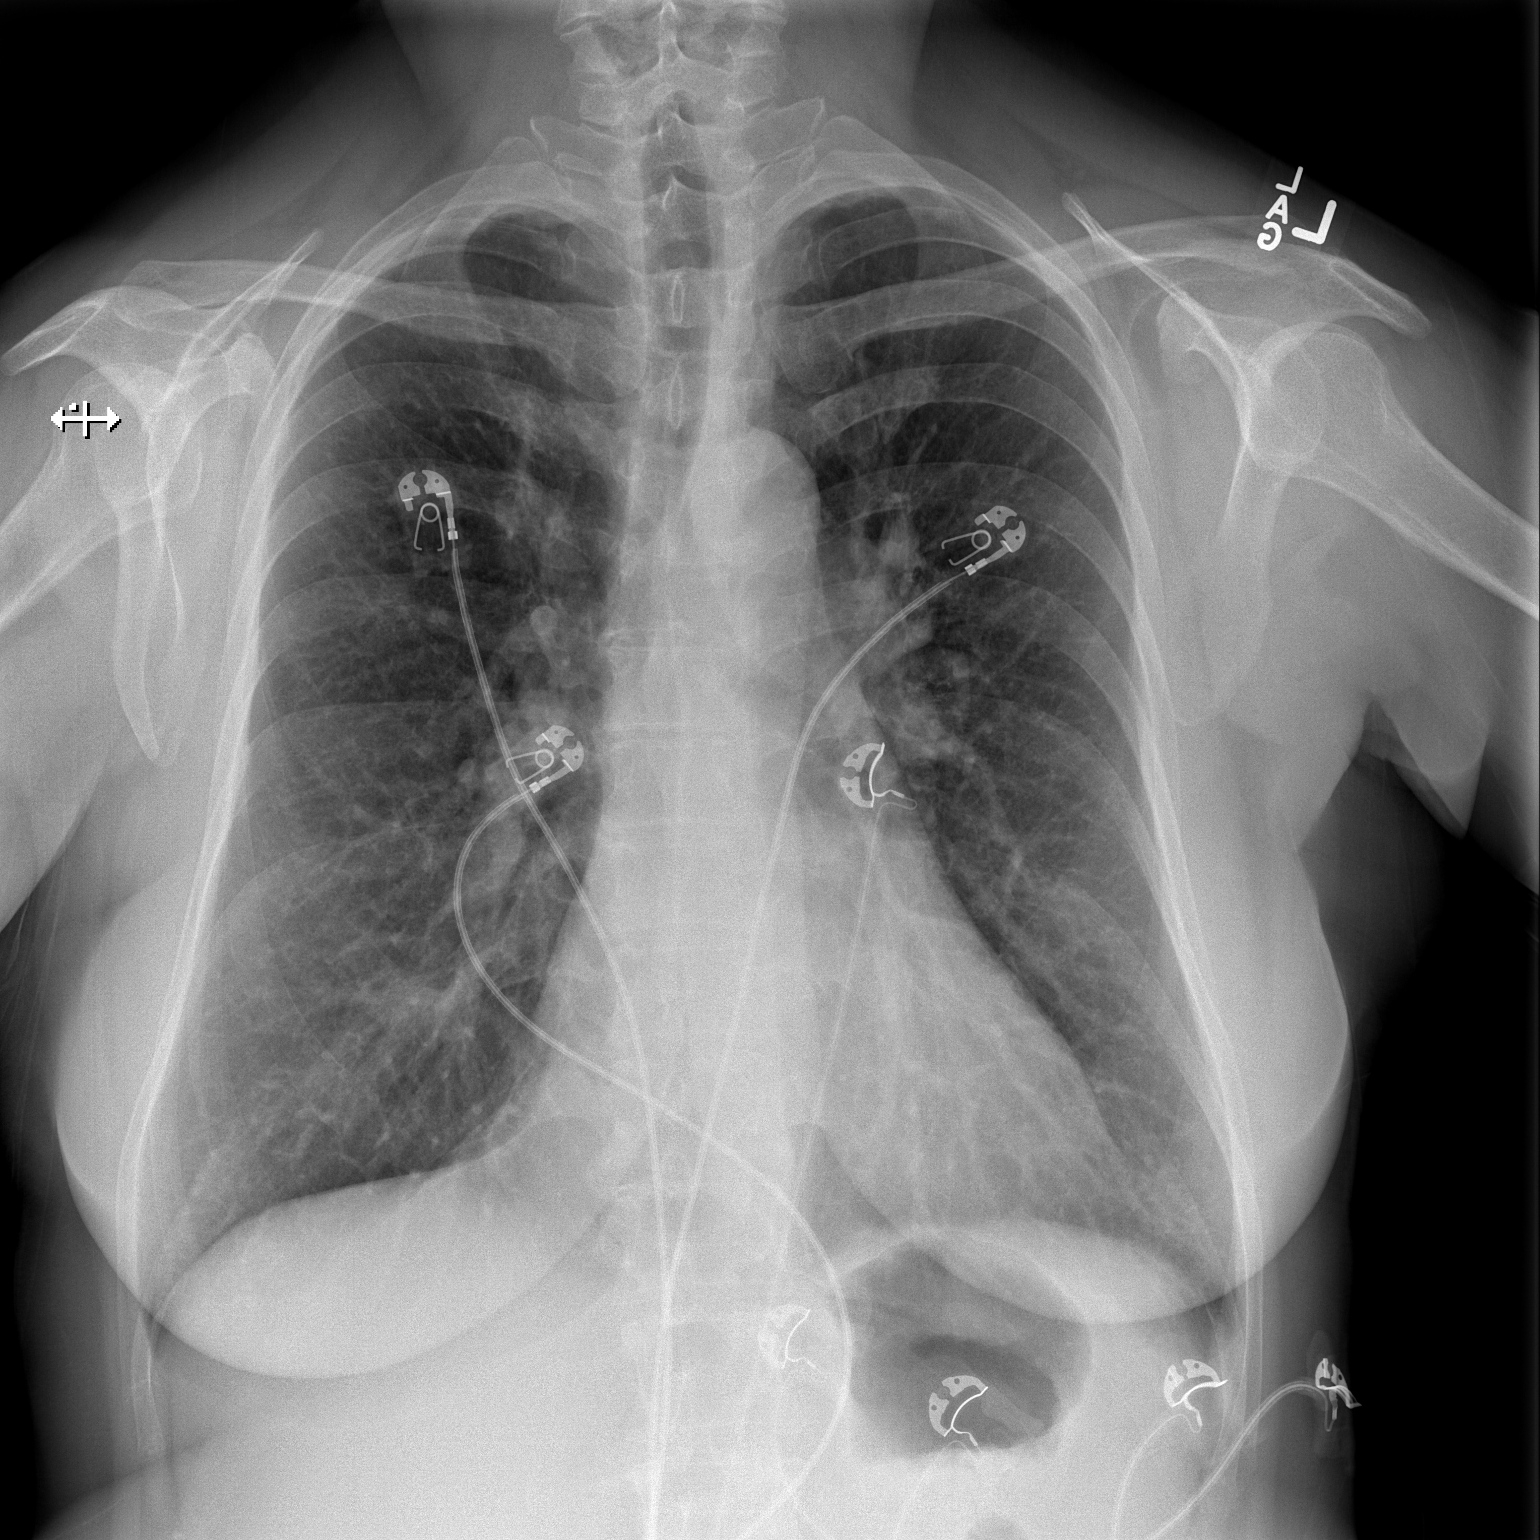

[w chest lat]
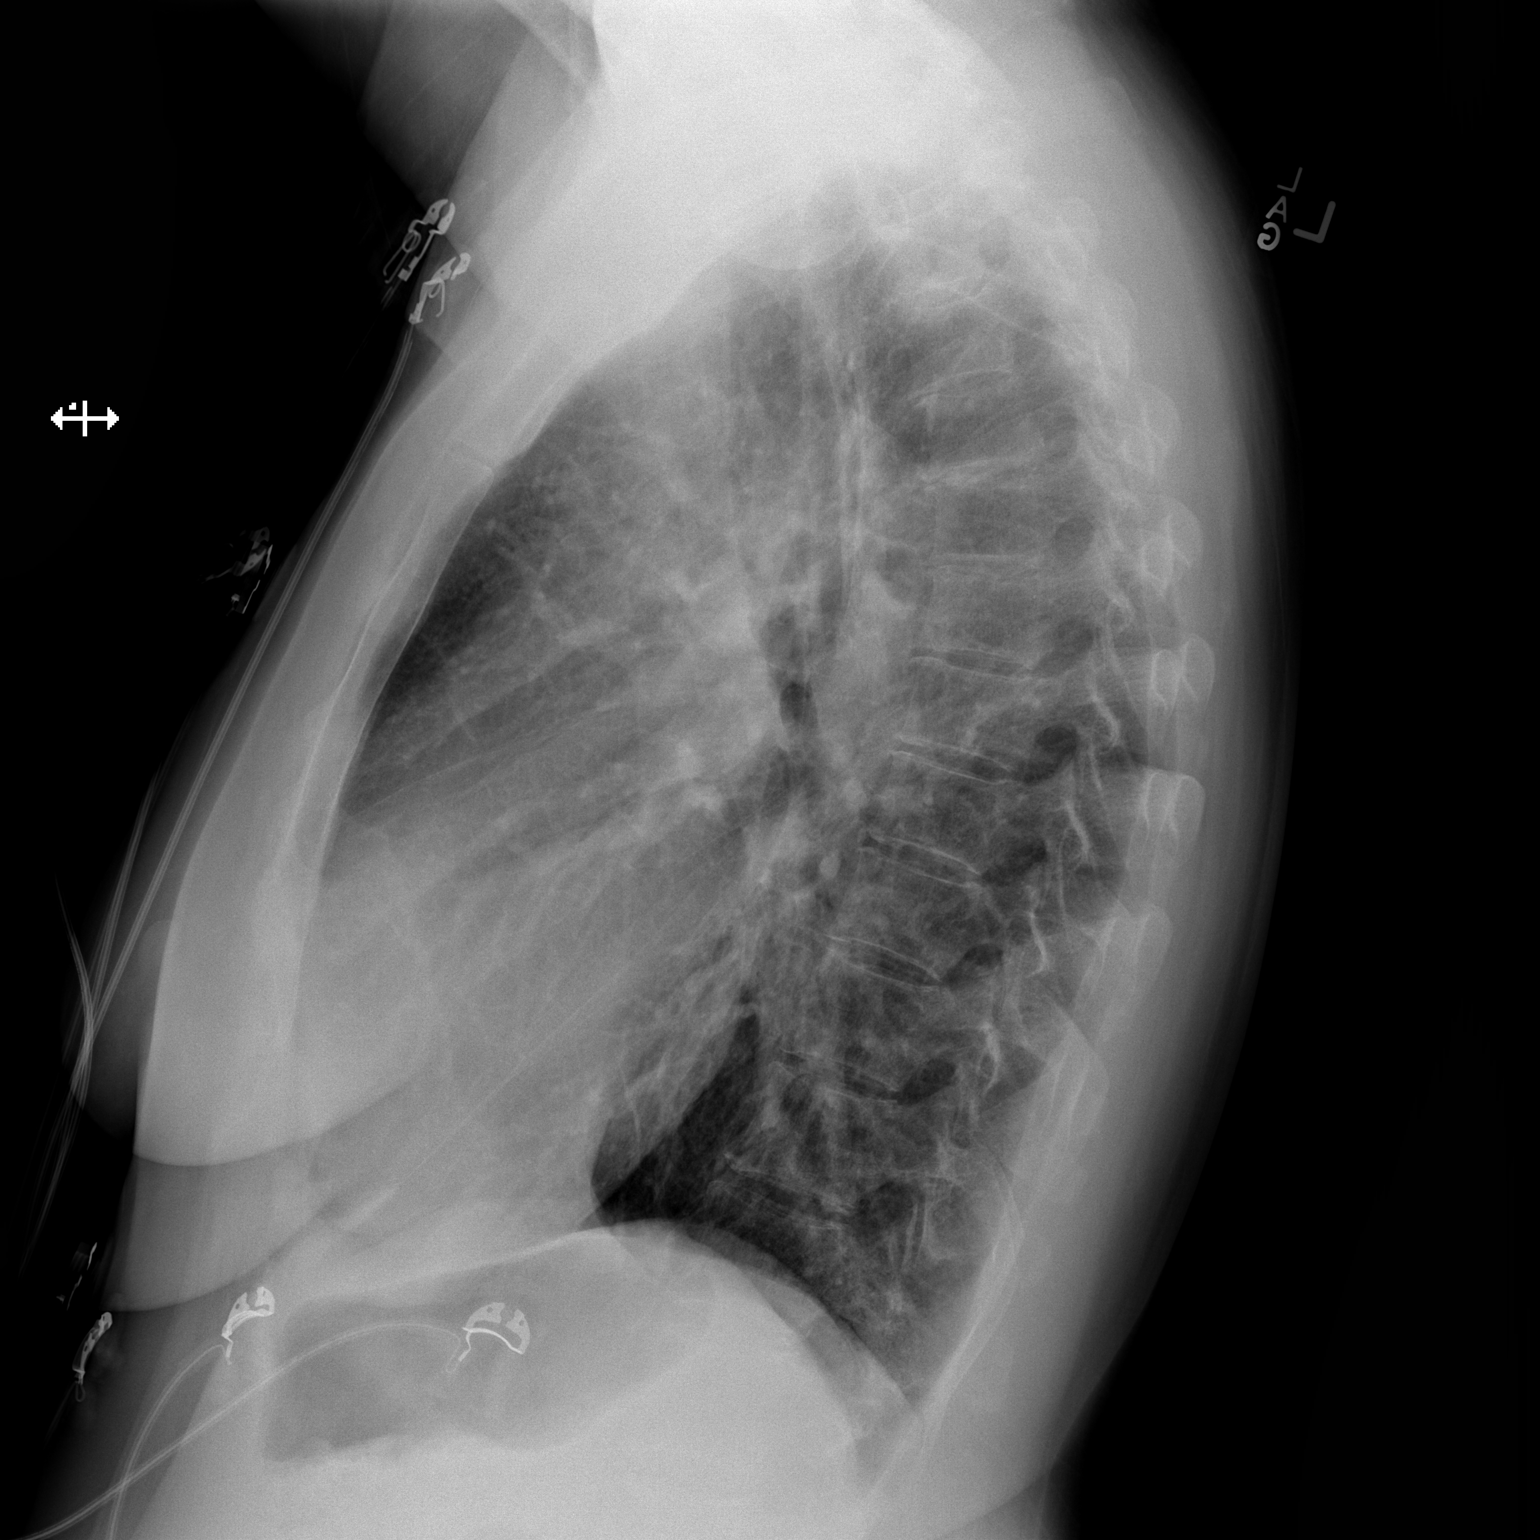

[2 of 2 positions shown; findings below may reference images not displayed]

FINDINGS: The heart size and mediastinal contours are within normal limits.
Both lungs are clear. The visualized skeletal structures are
unremarkable.
IMPRESSION: No active cardiopulmonary disease.

## 2019-08-20 ENCOUNTER — Ambulatory Visit: Payer: Self-pay | Admitting: Cardiology

## 2019-08-23 NOTE — Progress Notes (Signed)
Cardiology Office Note   Date:  08/23/2019   ID:  Madison Walker, DOB [birth year], MRN [id number]  PCP:  Patient, No Pcp Per  Cardiologist: Dr.Berry CC: Follow Up    History of Present Illness: Madison Walker is a 54 y.o. female who presents for ongoing assessment and management of atypical chest pain.  She had been seen in the ED in 2019 and was ruled out for ACS.  She has a family history of CAD with her brother receiving coronary artery stenting.  She did have a stress test on 11/29/2017 which was normal.  She was also noted to have a chronic right bundle branch block.  Dr. Gwenlyn Found felt her pain was more musculoskeletal in etiology.  When last seen on 09/30/2018 the patient had had no recurrence of symptoms.  No further testing was planned she was to be seen as needed.  She comes today with intermittent sharp chest discomfort underneath her left breast sometimes under knees her right breast.  Frequent palpitations, skipping heart rate, and rapid heart rate.  She works third shift and admits to drinking a lot of caffeine to keep her awake and she is not used to the night shift work hours.  She drinks a lot of coffee, also some [employer] Frappuccino shots, and takes a B12 tablet with caffeine.  She states that she only sleeps about 3 to 4 hours during the day and is extremely tired during her work shift causing her to increase her caffeine intake.  She denies dizziness associated with the rapid heart rhythm, the chest discomfort does not coincide with rapid heart rhythm or skipping.  She denies dyspnea on exertion, or during rapid heart rhythm.  It is becoming more apparent to her and she was concerned, causing her to make this appointment.  No past medical history on file.  Past Surgical History:  Procedure Laterality Date  . LAPAROSCOPIC UNILATERAL SALPINGO OOPHERECTOMY Left 05/31/2015   Procedure: LAPAROSCOPIC UNILATERAL SALPINGO OOPHORECTOMY;  Surgeon: Osborne Oman, MD;  Location: [facility];   Service: Gynecology;  Laterality: Left;  . TUBAL LIGATION    . UNILATERAL SALPINGECTOMY Right 05/31/2015   Procedure: UNILATERAL SALPINGECTOMY;  Surgeon: Osborne Oman, MD;  Location: [facility];  Service: Gynecology;  Laterality: Right;     Current Outpatient Medications  Medication Sig Dispense Refill  . Bioflavonoid Products (ESTER C PO) Take by mouth. Take 2 times a week    . polyethylene glycol (MIRALAX) packet Take 17 g by mouth daily. 10 each 0   No current facility-administered medications for this visit.    Allergies:   Iodine    Social History:  The patient  reports that she has never smoked. She has never used smokeless tobacco. She reports that she does not drink alcohol and does not use drugs.   Family History:  The patient's family history includes Coronary artery disease in her brother; Diabetes in her brother and another family member.    ROS: All other systems are reviewed and negative. Unless otherwise mentioned in H&P    PHYSICAL EXAM: VS:  There were no vitals taken for this visit. , BMI There is no height or weight on file to calculate BMI. GEN: Well nourished, well developed, in no acute distress HEENT: normal Neck: no JVD, carotid bruits, or masses Cardiac: RRR; 2/6 systolic murmur heard at the left sternal border fifth intercostal space  rubs, or gallops,no edema  Respiratory:  Clear to auscultation bilaterally, normal work of breathing GI: soft, nontender,  nondistended, + BS MS: no deformity or atrophy Skin: warm and dry, no rash Neuro:  Strength and sensation are intact Psych: euthymic mood, full affect   EKG: Normal sinus rhythm with first-degree AV block, PR interval 214 ms.  Heart rate 73 bpm, none specific intraventricular conduction delay.  (Personally reviewed)   Recent Labs: No results found for requested labs within last 8760 hours.    Lipid Panel    Component Value Date/Time   CHOL 152 11/29/2017 1502   TRIG 63 11/29/2017 1502   HDL  45 11/29/2017 1502   CHOLHDL 3.4 11/29/2017 1502   LDLCALC 94 11/29/2017 1502      Wt Readings from Last 3 Encounters:  09/30/18 183 lb 3.2 oz (83.1 kg)  06/19/18 190 lb (86.2 kg)  11/22/17 188 lb 12.8 oz (85.6 kg)      Other studies Reviewed: Stress Test (GXT) 11/29/2017   There was no ST segment deviation noted during stress.  No T wave inversion was noted during stress.  Blood pressure demonstrated a normal response to exercise.   Normal ECG stress test.    ASSESSMENT AND PLAN:  1.  Chest pain: Atypical described as sharp, aching feeling, underneath her breasts coming intermittently.  Nonradiating and not associated with shortness of breath dizziness or palpitations.  She does have a family history of coronary artery disease in her brother and hypertension in her mother.  May need to consider stress Myoview or coronary CTA on follow-up visit.  2.  Frequent palpitations: She has been ingesting a lot of caffeine in order to stay awake during night shift.  She also was not sleeping well during the day only 3 to 4 hours.  I am hearing a holosystolic murmur, I will order an echocardiogram for evaluation of LV systolic function, chamber size, and for valvular abnormalities.  I discussed putting a cardiac monitor on to evaluate for frequency and morphology of her palpitations.  She wishes to wait on this.  Have also advised her on decreasing caffeine intake as this can exacerbate her palpitations. I will check TSH, Mg+, and CBC.  She verbalizes understanding.  Current medicines are reviewed at length with the patient today.  I have spent 25 minutes dedicated to the care of this patient on the date of this encounter to include pre-visit review of records, assessment, management and diagnostic testing,with shared decision making.  Labs/ tests ordered today include: Echocardiogram,CBC, BMET, Mg+.  Phill Myron. [place] Pugh, ANP, AACC   08/23/2019 4:19 PM    [address] [address] Office [phone] Fax [phone]  Notice: This dictation was prepared with Dragon dictation along with smaller phrase technology. Any transcriptional errors that result from this process are unintentional and may not be corrected upon review.

## 2019-08-25 ENCOUNTER — Other Ambulatory Visit: Payer: Self-pay

## 2019-08-25 ENCOUNTER — Encounter: Payer: Self-pay | Admitting: Adult Health

## 2019-08-25 ENCOUNTER — Ambulatory Visit (INDEPENDENT_AMBULATORY_CARE_PROVIDER_SITE_OTHER): Payer: Self-pay | Admitting: Adult Health

## 2019-08-25 VITALS — BP 134/76 | HR 71 | Ht 68.0 in | Wt 177.2 lb

## 2019-08-25 DIAGNOSIS — R011 Cardiac murmur, unspecified: Secondary | ICD-10-CM

## 2019-08-25 DIAGNOSIS — R002 Palpitations: Secondary | ICD-10-CM | POA: Diagnosis not present

## 2019-08-25 DIAGNOSIS — Z79899 Other long term (current) drug therapy: Secondary | ICD-10-CM | POA: Diagnosis not present

## 2019-08-25 NOTE — Patient Instructions (Signed)
Medication Instructions:  Continue current medications  *If you need a refill on your cardiac medications before your next appointment, please call your pharmacy*   Lab Work: TSH, BMP, Magnesium, CBC  If you have labs (blood work) drawn today and your tests are completely normal, you will receive your results only by: Marland Kitchen MyChart Message (if you have MyChart) OR . A paper copy in the mail If you have any lab test that is abnormal or we need to change your treatment, we will call you to review the results.   Testing/Procedures: Your physician has requested that you have an echocardiogram. Echocardiography is a painless test that uses sound waves to create images of your heart. It provides your doctor with information about the size and shape of your heart and how well your heart's chambers and valves are working. This procedure takes approximately one hour. There are no restrictions for this procedure.   Follow-Up: At [facility], you and your health needs are our priority.  As part of our continuing mission to provide you with exceptional heart care, we have created designated Provider Care Teams.  These Care Teams include your primary Cardiologist (physician) and Advanced Practice Providers (APPs -  Physician Assistants and Nurse Practitioners) who all work together to provide you with the care you need, when you need it.  We recommend signing up for the patient portal called "MyChart".  Sign up information is provided on this After Visit Summary.  MyChart is used to connect with patients for Virtual Visits (Telemedicine).  Patients are able to view lab/test results, encounter notes, upcoming appointments, etc.  Non-urgent messages can be sent to your provider as well.   To learn more about what you can do with MyChart, go to [email or web address].    Your next appointment:   1 month(s)  The format for your next appointment:   In Person  Provider:   You may see Quay Burow, MD  or one of the following Advanced Practice Providers on your designated Care Team:    Kerin Ransom, PA-C  [place], [place]  Coletta Memos, [place]

## 2019-08-26 LAB — BASIC METABOLIC PANEL
BUN/Creatinine Ratio: 31 — ABNORMAL HIGH (ref 9–23)
BUN: 22 mg/dL (ref 6–24)
CO2: 23 mmol/L (ref 20–29)
Calcium: 9.3 mg/dL (ref 8.7–10.2)
Chloride: 107 mmol/L — ABNORMAL HIGH (ref 96–106)
Creatinine, Ser: 0.72 mg/dL (ref 0.57–1.00)
GFR calc Af Amer: 109 mL/min/{1.73_m2} (ref >59)
GFR calc non Af Amer: 95 mL/min/{1.73_m2} (ref >59)
Glucose: 90 mg/dL (ref 65–99)
Potassium: 4.2 mmol/L (ref 3.5–5.2)
Sodium: 142 mmol/L (ref 134–144)

## 2019-08-26 LAB — CBC
Hematocrit: 37.7 % (ref 34.0–46.6)
Hemoglobin: 12.9 g/dL (ref 11.1–15.9)
MCH: 33.4 pg — ABNORMAL HIGH (ref 26.6–33.0)
MCHC: 34.2 g/dL (ref 31.5–35.7)
MCV: 98 fL — ABNORMAL HIGH (ref 79–97)
Platelets: 198 10*3/uL (ref 150–450)
RBC: 3.86 x10E6/uL (ref 3.77–5.28)
RDW: 11.8 % (ref 11.7–15.4)
WBC: 5.5 10*3/uL (ref 3.4–10.8)

## 2019-08-26 LAB — TSH: TSH: 3.68 u[IU]/mL (ref 0.450–4.500)

## 2019-08-26 LAB — MAGNESIUM: Magnesium: 2.1 mg/dL (ref 1.6–2.3)

## 2019-08-27 ENCOUNTER — Telehealth: Payer: Self-pay | Admitting: Adult Health

## 2019-08-27 NOTE — Telephone Encounter (Signed)
Labs have been reviewed. They are all essentially normal. No electrolyte abnormality or anemia causing the racing HR. We will wait on echo. May need to consider cardiac monitor on next visit  Pt aware of lab results./cy

## 2019-08-27 NOTE — Telephone Encounter (Signed)
    Patient calling for lab results 

## 2019-09-11 ENCOUNTER — Ambulatory Visit (HOSPITAL_COMMUNITY): Payer: Self-pay | Attending: Cardiovascular Disease

## 2019-09-11 ENCOUNTER — Other Ambulatory Visit: Payer: Self-pay

## 2019-09-11 ENCOUNTER — Other Ambulatory Visit (HOSPITAL_COMMUNITY): Payer: Self-pay

## 2019-09-11 DIAGNOSIS — R002 Palpitations: Secondary | ICD-10-CM | POA: Insufficient documentation

## 2019-09-11 DIAGNOSIS — R011 Cardiac murmur, unspecified: Secondary | ICD-10-CM | POA: Insufficient documentation

## 2019-09-11 LAB — ECHOCARDIOGRAM COMPLETE
Area-P 1/2: 3.6 cm2
S' Lateral: 2.9 cm

## 2019-09-17 ENCOUNTER — Telehealth: Payer: Self-pay | Admitting: Adult Health

## 2019-09-17 NOTE — Telephone Encounter (Signed)
° ° °  Pt is calling to get echo result °

## 2019-09-17 NOTE — Telephone Encounter (Signed)
Called and spoke with pt, reviewed results from echo study. Pt stated that sometimes she has to take a deep breath to catch her breath and that at times she may have an ache or pain on the left side of her chest where the ultrasound tech was pressing the probe.  Pt states she would like for Kathryn's full opinion of the results to see if she is concerned or has any other recommendations such as medications. Notified I was reading word for word what Curt Bears had stated but that I would send this message to her to see if she would explain more in depth. Pt would like for Curt Bears to call her personally to explain. Will route to NP

## 2019-09-18 ENCOUNTER — Telehealth: Payer: Self-pay | Admitting: Adult Health

## 2019-09-18 NOTE — Telephone Encounter (Signed)
I spoke with patient by phone.   KL

## 2019-09-18 NOTE — Telephone Encounter (Signed)
I will call her back today.   Curt Bears

## 2019-09-18 NOTE — Telephone Encounter (Signed)
Madison Sims, DNP spoke with pt

## 2019-09-18 NOTE — Telephone Encounter (Signed)
Called Mrs.Glassner back to talk with her about echo results. Left a voice mail to call me back. She works nights.Curt Bears

## 2019-09-18 NOTE — Telephone Encounter (Signed)
Follow up  Pt is returning call   Call transferred to [facility]

## 2019-10-06 ENCOUNTER — Ambulatory Visit: Payer: Self-pay | Admitting: Cardiovascular Disease

## 2019-11-13 ENCOUNTER — Ambulatory Visit: Payer: Self-pay | Admitting: Cardiovascular Disease

## 2019-11-27 ENCOUNTER — Telehealth: Payer: Self-pay | Admitting: Cardiovascular Disease

## 2019-11-27 NOTE — Telephone Encounter (Signed)
Pt called and would like to transition from Dr. Gwenlyn Found to Dr. Harrell Gave. Please confirm transition of care.

## 2019-11-29 NOTE — Telephone Encounter (Signed)
Fine with me.  JJB 

## 2019-11-29 NOTE — Telephone Encounter (Signed)
OK by me 

## 2019-12-05 DIAGNOSIS — R1084 Generalized abdominal pain: Secondary | ICD-10-CM | POA: Diagnosis not present

## 2019-12-05 DIAGNOSIS — K5900 Constipation, unspecified: Secondary | ICD-10-CM | POA: Diagnosis not present

## 2019-12-18 ENCOUNTER — Encounter: Payer: Self-pay | Admitting: Cardiology

## 2019-12-18 ENCOUNTER — Ambulatory Visit (INDEPENDENT_AMBULATORY_CARE_PROVIDER_SITE_OTHER): Payer: BC Managed Care – PPO | Admitting: Cardiology

## 2019-12-18 ENCOUNTER — Ambulatory Visit: Payer: Self-pay | Admitting: Cardiovascular Disease

## 2019-12-18 ENCOUNTER — Other Ambulatory Visit: Payer: Self-pay

## 2019-12-18 VITALS — BP 122/76 | HR 67 | Ht 68.0 in | Wt 182.0 lb

## 2019-12-18 DIAGNOSIS — Z7189 Other specified counseling: Secondary | ICD-10-CM

## 2019-12-18 DIAGNOSIS — Z712 Person consulting for explanation of examination or test findings: Secondary | ICD-10-CM | POA: Diagnosis not present

## 2019-12-18 DIAGNOSIS — R931 Abnormal findings on diagnostic imaging of heart and coronary circulation: Secondary | ICD-10-CM

## 2019-12-18 DIAGNOSIS — Z8249 Family history of ischemic heart disease and other diseases of the circulatory system: Secondary | ICD-10-CM | POA: Diagnosis not present

## 2019-12-18 DIAGNOSIS — R0789 Other chest pain: Secondary | ICD-10-CM

## 2019-12-18 NOTE — Progress Notes (Signed)
Cardiology Office Note:    Date:  12/18/2019   ID:  Madison Walker, DOB Oct 23, 1965, MRN [id number]  PCP:  Patient, No Pcp Per  Cardiologist:  Buford Dresser, MD  Referring MD: No ref. provider found   CC: new patient/switch providers  History of Present Illness:    Madison Walker is a 54 y.o. female with a hx of chest pain who is seen as a new patient to me/provider switch. She was previously followed by Dr. Gwenlyn Found.  Today: Continues to have intermittent aching chest pain in different locations. Not a sharp pain, more of an ache. Sometimes can have multiple times/day, some days none at all. Lasts a minute or so. No clear aggravating/alleviating factors. No diaphoresis, nausea, shortness of breath. Rare lightheadedness, no syncope. Rare palpitations, very brief. Has tried to cut back on caffeine, but she works third shift. Hasn't been able to eliminate completely.  Saw Jory Sims most recently 08/25/19. Had echo done after this. Was told her mitral valve was abnormal. We reviewed this together today.  FH: brother had heart attack, had one stent put in. Was mid to late 55s at the time.   She is overall concerned about her heart/risk of heart disease given her family history. We discussed her previously testing results as well as risk counseling/prevention today.  Denies shortness of breath at rest or with normal exertion. No PND, orthopnea, LE edema or unexpected weight gain. No syncope or palpitations.  No past medical history on file.  Past Surgical History:  Procedure Laterality Date  . LAPAROSCOPIC UNILATERAL SALPINGO OOPHERECTOMY Left 05/31/2015   Procedure: LAPAROSCOPIC UNILATERAL SALPINGO OOPHORECTOMY;  Surgeon: Osborne Oman, MD;  Location: [facility];  Service: Gynecology;  Laterality: Left;  . TUBAL LIGATION    . UNILATERAL SALPINGECTOMY Right 05/31/2015   Procedure: UNILATERAL SALPINGECTOMY;  Surgeon: Osborne Oman, MD;  Location: [facility];  Service: Gynecology;   Laterality: Right;    Current Medications: No current outpatient medications on file prior to visit.   No current facility-administered medications on file prior to visit.     Allergies:   Iodine   Social History   Tobacco Use  . Smoking status: Never Smoker  . Smokeless tobacco: Never Used  Substance Use Topics  . Alcohol use: No  . Drug use: No    Family History: family history includes Coronary artery disease in her brother; Diabetes in her brother and another family member.  ROS:   Please see the history of present illness.  Additional pertinent ROS: Constitutional: Negative for chills, fever, night sweats, unintentional weight loss  HENT: Negative for ear pain and hearing loss.   Eyes: Negative for loss of vision and eye pain.  Respiratory: Negative for cough, sputum, wheezing.   Cardiovascular: See HPI. Gastrointestinal: Negative for abdominal pain, melena, and hematochezia.  Genitourinary: Negative for dysuria and hematuria.  Musculoskeletal: Negative for falls and myalgias.  Skin: Negative for itching and rash.  Neurological: Negative for focal weakness, focal sensory changes and loss of consciousness.  Endo/Heme/Allergies: Does not bruise/bleed easily.     EKGs/Labs/Other Studies Reviewed:    The following studies were reviewed today: Echo 09/11/19 1. Left ventricular ejection fraction, by estimation, is 60 to 65%. The  left ventricle has normal function. The left ventricle has no regional  wall motion abnormalities. Left ventricular diastolic parameters were  normal.  2. Right ventricular systolic function is moderately reduced. The right  ventricular size is moderately enlarged. There is mildly elevated  pulmonary artery  systolic pressure.  3. Note well seen no obvious PFO/ASD to explain RVE suggest bubble study  to further evaluate.  4. Right atrial size was mildly dilated.  5. Mild bi leaflet prolapse with mild posteriorly directed MR . The   mitral valve is abnormal. Mild mitral valve regurgitation. No evidence of  mitral stenosis.  6. The aortic valve is normal in structure. Aortic valve regurgitation is  not visualized. No aortic stenosis is present.  7. The inferior vena cava is normal in size with greater than 50%  respiratory variability, suggesting right atrial pressure of 3 mmHg.   ETT 11/29/2017  There was no ST segment deviation noted during stress.  No T wave inversion was noted during stress.  Blood pressure demonstrated a normal response to exercise.   Normal ECG stress test.  EKG:  EKG is personally reviewed.  The ekg ordered 08/25/19 demonstrates SR, 1st degree AV block, IVCD  Recent Labs: 08/25/2019: BUN 22; Creatinine, Ser 0.72; Hemoglobin 12.9; Magnesium 2.1; Platelets 198; Potassium 4.2; Sodium 142; TSH 3.680  Recent Lipid Panel    Component Value Date/Time   CHOL 152 11/29/2017 1502   TRIG 63 11/29/2017 1502   HDL 45 11/29/2017 1502   CHOLHDL 3.4 11/29/2017 1502   LDLCALC 94 11/29/2017 1502    Physical Exam:    VS:  BP 122/76   Pulse 67   Ht 5\' 8"  (1.727 m)   Wt 182 lb (82.6 kg)   SpO2 97%   BMI 27.67 kg/m     Wt Readings from Last 3 Encounters:  12/18/19 182 lb (82.6 kg)  08/25/19 177 lb 3.2 oz (80.4 kg)  09/30/18 183 lb 3.2 oz (83.1 kg)    GEN: Well nourished, well developed in no acute distress HEENT: Normal, moist mucous membranes NECK: No JVD CARDIAC: regular rhythm, normal S1 and S2, no rubs or gallops. 1/6 systolic murmurs. VASCULAR: Radial and DP pulses 2+ bilaterally. No carotid bruits RESPIRATORY:  Clear to auscultation without rales, wheezing or rhonchi  ABDOMEN: Soft, non-tender, non-distended MUSCULOSKELETAL:  Ambulates independently SKIN: Warm and dry, no edema NEUROLOGIC:  Alert and oriented x 3. No focal neuro deficits noted. PSYCHIATRIC:  Normal affect    ASSESSMENT:    1. Atypical chest pain   2. Family history of coronary artery disease in brother   3.  Encounter to discuss test results   4. Abnormal echocardiogram   5. Cardiac risk counseling   6. Counseling on health promotion and disease prevention    PLAN:    Atypical chest pain: -reviewed prior workup, reassuring -very low ASCVD risk -reviewed red flag warning signs that need immediate medical attention  Abnormal echo: -mild mitral abnormalities, discussed today  Cardiac risk counseling and prevention recommendations: in the setting of family history of CAD -recommend heart healthy/Mediterranean diet, with whole grains, fruits, vegetable, fish, lean meats, nuts, and olive oil. Limit salt. -recommend moderate walking, 3-5 times/week for 30-50 minutes each session. Aim for at least 150 minutes.week. Goal should be pace of 3 miles/hours, or walking 1.5 miles in 30 minutes -recommend avoidance of tobacco products. Avoid excess alcohol. -ASCVD risk score: The 10-year ASCVD risk score Mikey Bussing DC Jr., et al., 2013) is: 1.8%   Values used to calculate the score:     Age: 41 years     Sex: Female     Is Non-Hispanic African American: No     Diabetic: No     Tobacco smoker: No     Systolic Blood  Pressure: 122 mmHg     Is BP treated: No     HDL Cholesterol: 45 mg/dL     Total Cholesterol: 152 mg/dL    Plan for follow up: 2 years or sooner as needed, per patient preference  Buford Dresser, MD, PhD [place]  [facility]    Medication Adjustments/Labs and Tests Ordered: Current medicines are reviewed at length with the patient today.  Concerns regarding medicines are outlined above.  No orders of the defined types were placed in this encounter.  No orders of the defined types were placed in this encounter.   Patient Instructions  Medication Instructions:  Your Physician recommend you continue on your current medication as directed.    *If you need a refill on your cardiac medications before your next appointment, please call your pharmacy*   Lab  Work: None   Testing/Procedures: None   Follow-Up: At [facility], you and your health needs are our priority.  As part of our continuing mission to provide you with exceptional heart care, we have created designated Provider Care Teams.  These Care Teams include your primary Cardiologist (physician) and Advanced Practice Providers (APPs -  Physician Assistants and Nurse Practitioners) who all work together to provide you with the care you need, when you need it.  We recommend signing up for the patient portal called "MyChart".  Sign up information is provided on this After Visit Summary.  MyChart is used to connect with patients for Virtual Visits (Telemedicine).  Patients are able to view lab/test results, encounter notes, upcoming appointments, etc.  Non-urgent messages can be sent to your provider as well.   To learn more about what you can do with MyChart, go to [email or web address].    Your next appointment:   2 year(s)  The format for your next appointment:   In Person  Provider:   Buford Dresser, MD      Signed, Buford Dresser, MD PhD 12/18/2019    [place]

## 2019-12-18 NOTE — Patient Instructions (Signed)
Medication Instructions:  Your Physician recommend you continue on your current medication as directed.    *If you need a refill on your cardiac medications before your next appointment, please call your pharmacy*   Lab Work: None   Testing/Procedures: None   Follow-Up: At [facility], you and your health needs are our priority.  As part of our continuing mission to provide you with exceptional heart care, we have created designated Provider Care Teams.  These Care Teams include your primary Cardiologist (physician) and Advanced Practice Providers (APPs -  Physician Assistants and Nurse Practitioners) who all work together to provide you with the care you need, when you need it.  We recommend signing up for the patient portal called "MyChart".  Sign up information is provided on this After Visit Summary.  MyChart is used to connect with patients for Virtual Visits (Telemedicine).  Patients are able to view lab/test results, encounter notes, upcoming appointments, etc.  Non-urgent messages can be sent to your provider as well.   To learn more about what you can do with MyChart, go to [email or web address].    Your next appointment:   2 year(s)  The format for your next appointment:   In Person  Provider:   Bridgette Christopher, MD     

## 2019-12-23 ENCOUNTER — Ambulatory Visit (INDEPENDENT_AMBULATORY_CARE_PROVIDER_SITE_OTHER): Payer: BC Managed Care – PPO | Admitting: Nurse Practitioner

## 2019-12-23 ENCOUNTER — Encounter: Payer: Self-pay | Admitting: Nurse Practitioner

## 2019-12-23 ENCOUNTER — Other Ambulatory Visit: Payer: Self-pay

## 2019-12-23 VITALS — BP 133/72 | HR 62 | Temp 98.2°F | Resp 20 | Ht 67.0 in | Wt 183.8 lb

## 2019-12-23 DIAGNOSIS — Z13220 Encounter for screening for lipoid disorders: Secondary | ICD-10-CM

## 2019-12-23 DIAGNOSIS — Z131 Encounter for screening for diabetes mellitus: Secondary | ICD-10-CM

## 2019-12-23 DIAGNOSIS — R600 Localized edema: Secondary | ICD-10-CM

## 2019-12-23 DIAGNOSIS — Z7689 Persons encountering health services in other specified circumstances: Secondary | ICD-10-CM

## 2019-12-23 DIAGNOSIS — I4510 Unspecified right bundle-branch block: Secondary | ICD-10-CM

## 2019-12-23 DIAGNOSIS — Z114 Encounter for screening for human immunodeficiency virus [HIV]: Secondary | ICD-10-CM | POA: Diagnosis not present

## 2019-12-23 DIAGNOSIS — Z1389 Encounter for screening for other disorder: Secondary | ICD-10-CM

## 2019-12-23 DIAGNOSIS — N949 Unspecified condition associated with female genital organs and menstrual cycle: Secondary | ICD-10-CM

## 2019-12-23 DIAGNOSIS — Z1159 Encounter for screening for other viral diseases: Secondary | ICD-10-CM

## 2019-12-23 DIAGNOSIS — R350 Frequency of micturition: Secondary | ICD-10-CM | POA: Diagnosis not present

## 2019-12-23 DIAGNOSIS — Z23 Encounter for immunization: Secondary | ICD-10-CM

## 2019-12-23 LAB — POCT URINALYSIS DIP (CLINITEK)
Bilirubin, UA: NEGATIVE
Blood, UA: NEGATIVE
Glucose, UA: NEGATIVE mg/dL
Ketones, POC UA: NEGATIVE mg/dL
Leukocytes, UA: NEGATIVE
Nitrite, UA: NEGATIVE
POC PROTEIN,UA: NEGATIVE
Spec Grav, UA: >=1.03 — AB (ref 1.010–1.025)
Urobilinogen, UA: 0.2 E.U./dL
pH, UA: 6.5 (ref 5.0–8.0)

## 2019-12-23 NOTE — Patient Instructions (Addendum)
Health Maintenance, Female Adopting a healthy lifestyle and getting preventive care are important in promoting health and wellness. Ask your health care provider about:  The right schedule for you to have regular tests and exams.  Things you can do on your own to prevent diseases and keep yourself healthy. What should I know about diet, weight, and exercise? Eat a healthy diet   Eat a diet that includes plenty of vegetables, fruits, low-fat dairy products, and lean protein.  Do not eat a lot of foods that are high in solid fats, added sugars, or sodium. Maintain a healthy weight Body mass index (BMI) is used to identify weight problems. It estimates body fat based on height and weight. Your health care provider can help determine your BMI and help you achieve or maintain a healthy weight. Get regular exercise Get regular exercise. This is one of the most important things you can do for your health. Most adults should:  Exercise for at least 150 minutes each week. The exercise should increase your heart rate and make you sweat (moderate-intensity exercise).  Do strengthening exercises at least twice a week. This is in addition to the moderate-intensity exercise.  Spend less time sitting. Even light physical activity can be beneficial. Watch cholesterol and blood lipids Have your blood tested for lipids and cholesterol at 56 years of age, then have this test every 5 years. Have your cholesterol levels checked more often if:  Your lipid or cholesterol levels are high.  You are older than 56 years of age.  You are at high risk for heart disease. What should I know about cancer screening? Depending on your health history and family history, you may need to have cancer screening at various ages. This may include screening for:  Breast cancer.  Cervical cancer.  Colorectal cancer.  Skin cancer.  Lung cancer. What should I know about heart disease, diabetes, and high blood  pressure? Blood pressure and heart disease  High blood pressure causes heart disease and increases the risk of stroke. This is more likely to develop in people who have high blood pressure readings, are of African descent, or are overweight.  Have your blood pressure checked: ? Every 3-5 years if you are 18-39 years of age. ? Every year if you are 40 years old or older. Diabetes Have regular diabetes screenings. This checks your fasting blood sugar level. Have the screening done:  Once every three years after age 40 if you are at a normal weight and have a low risk for diabetes.  More often and at a younger age if you are overweight or have a high risk for diabetes. What should I know about preventing infection? Hepatitis B If you have a higher risk for hepatitis B, you should be screened for this virus. Talk with your health care provider to find out if you are at risk for hepatitis B infection. Hepatitis C Testing is recommended for:  Everyone born from 1945 through 1965.  Anyone with known risk factors for hepatitis C. Sexually transmitted infections (STIs)  Get screened for STIs, including gonorrhea and chlamydia, if: ? You are sexually active and are younger than 56 years of age. ? You are older than 56 years of age and your health care provider tells you that you are at risk for this type of infection. ? Your sexual activity has changed since you were last screened, and you are at increased risk for chlamydia or gonorrhea. Ask your health care provider if   you are at risk.  Ask your health care provider about whether you are at high risk for HIV. Your health care provider may recommend a prescription medicine to help prevent HIV infection. If you choose to take medicine to prevent HIV, you should first get tested for HIV. You should then be tested every 3 months for as long as you are taking the medicine. Pregnancy  If you are about to stop having your period (premenopausal) and  you may become pregnant, seek counseling before you get pregnant.  Take 400 to 800 micrograms (mcg) of folic acid every day if you become pregnant.  Ask for birth control (contraception) if you want to prevent pregnancy. Osteoporosis and menopause Osteoporosis is a disease in which the bones lose minerals and strength with aging. This can result in bone fractures. If you are 52 years old or older, or if you are at risk for osteoporosis and fractures, ask your health care provider if you should:  Be screened for bone loss.  Take a calcium or vitamin D supplement to lower your risk of fractures.  Be given hormone replacement therapy (HRT) to treat symptoms of menopause. Follow these instructions at home: Lifestyle  Do not use any products that contain nicotine or tobacco, such as cigarettes, e-cigarettes, and chewing tobacco. If you need help quitting, ask your health care provider.  Do not use street drugs.  Do not share needles.  Ask your health care provider for help if you need support or information about quitting drugs. Alcohol use  Do not drink alcohol if: ? Your health care provider tells you not to drink. ? You are pregnant, may be pregnant, or are planning to become pregnant.  If you drink alcohol: ? Limit how much you use to 0-1 drink a day. ? Limit intake if you are breastfeeding.  Be aware of how much alcohol is in your drink. In the [place] one drink equals one 12 oz bottle of beer (355 mL), one 5 oz glass of wine (148 mL), or one 1 oz glass of hard liquor (44 mL). General instructions  Schedule regular health, dental, and eye exams.  Stay current with your vaccines.  Tell your health care provider if: ? You often feel depressed. ? You have ever been abused or do not feel safe at home. Summary  Adopting a healthy lifestyle and getting preventive care are important in promoting health and wellness.  Follow your health care provider's instructions about healthy  diet, exercising, and getting tested or screened for diseases.  Follow your health care provider's instructions on monitoring your cholesterol and blood pressure. This information is not intended to replace advice given to you by your health care provider. Make sure you discuss any questions you have with your health care provider. Document Revised: 01/22/2018 Document Reviewed: 01/22/2018 Elsevier Patient Education  [place].   Varicose Veins Varicose veins are veins that have become enlarged, bulged, and twisted. They most often appear in the legs. What are the causes? This condition is caused by damage to the valves in the vein. These valves help blood return to your heart. When they are damaged and they stop working properly, blood may flow backward and back up in the veins near the skin, causing the veins to get larger and appear twisted. The condition can result from any issue that causes blood to back up, like pregnancy, prolonged standing, or obesity. What increases the risk? This condition is more likely to develop in people  who are:  On their feet a lot.  Pregnant.  Overweight. What are the signs or symptoms? Symptoms of this condition include:  Bulging, twisted, and bluish veins.  A feeling of heaviness. This may be worse at the end of the day.  Leg pain. This may be worse at the end of the day.  Swelling in the leg.  Changes in skin color over the veins. How is this diagnosed? This condition may be diagnosed based on your symptoms, a physical exam, and an ultrasound test. How is this treated? Treatment for this condition may involve:  Avoiding sitting or standing in one position for long periods of time.  Wearing compression stockings. These stockings help to prevent blood clots and reduce swelling in the legs.  Raising (elevating) the legs when resting.  Losing weight.  Exercising regularly. If you have persistent symptoms or want to improve the  way your varicose veins look, you may choose to have a procedure to close the varicose veins off or to remove them. Treatments to close off the veins include:  Sclerotherapy. In this treatment, a solution is injected into a vein to close it off.  Laser treatment. In this treatment, the vein is heated with a laser to close it off.  Radiofrequency vein ablation. In this treatment, an electrical current produced by radio waves is used to close off the vein. Treatments to remove the veins include:  Phlebectomy. In this treatment, the veins are removed through small incisions made over the veins.  Vein ligation and stripping. In this treatment, incisions are made over the veins. The veins are then removed after being tied (ligated) with stitches (sutures). Follow these instructions at home: Activity  Walk as much as possible. Walking increases blood flow. This helps blood return to the heart and takes pressure off your veins. It also increases your cardiovascular strength.  Follow your health care provider's instructions about exercising.  Do not stand or sit in one position for a long period of time.  Do not sit with your legs crossed.  Rest with your legs raised during the day. General instructions   Follow any diet instructions given to you by your health care provider.  Wear compression stockings as directed by your health care provider. Do not wear other kinds of tight clothing around your legs, pelvis, or waist.  Elevate your legs at night to above the level of your heart.  If you get a cut in the skin over the varicose vein and the vein bleeds: ? Lie down with your leg raised. ? Apply firm pressure to the cut with a clean cloth until the bleeding stops. ? Place a bandage (dressing) on the cut. Contact a health care provider if:  The skin around your varicose veins starts to break down.  You have pain, redness, tenderness, or hard swelling over a vein.  You are  uncomfortable because of pain.  You get a cut in the skin over a varicose vein and it will not stop bleeding. Summary  Varicose veins are veins that have become enlarged, bulged, and twisted. They most often appear in the legs.  This condition is caused by damage to the valves in the vein. These valves help blood return to your heart.  Treatment for this condition includes frequent movements, wearing compression stockings, losing weight, and exercising regularly. In some cases, procedures are done to close off or remove the veins.  Treatment for this condition may include wearing compression stockings, elevating the legs,  losing weight, and engaging in regular activity. In some cases, procedures are done to close off or remove the veins. This information is not intended to replace advice given to you by your health care provider. Make sure you discuss any questions you have with your health care provider. Document Revised: 03/27/2018 Document Reviewed: 02/22/2016 Elsevier Patient Education  [place].

## 2019-12-23 NOTE — Progress Notes (Signed)
[place] [place], [place]  [address] Phone:  [phone]   Fax:  [phone]    New Patient Office Visit  Subjective:  Patient ID: Madison Walker, female    DOB: May 06, 1965  Age: 54 y.o. MRN: [id number]  CC:  Chief Complaint  Patient presents with  . Establish Care    HPI Madison Walker presents for establish care. She  has no past medical history on file.    She was diagnosed with a heart murmur. She has been seen by cardiology. She has had stress test in the past. Her brother MI a few years ago with stent.  She admits that she has occasional aches and pains. She uses tumeric and OTC OA meds. She does work full-time standing.  She has numbness with cold feeling in her feet and legs.  She has varicose veins left greater than the right and admits that on one occasion while working she developed an abnormal sensation in her left vein and this continues today.  She denies any swelling.  She denies any tobacco use.  She is concerned about diabetes.  She admits that there is a family history.  She is currently making lifestyle changes and has lost few pounds.  No past medical history on file.  Past Surgical History:  Procedure Laterality Date  . LAPAROSCOPIC UNILATERAL SALPINGO OOPHERECTOMY Left 05/31/2015   Procedure: LAPAROSCOPIC UNILATERAL SALPINGO OOPHORECTOMY;  Surgeon: Osborne Oman, MD;  Location: [facility];  Service: Gynecology;  Laterality: Left;  . TUBAL LIGATION    . UNILATERAL SALPINGECTOMY Right 05/31/2015   Procedure: UNILATERAL SALPINGECTOMY;  Surgeon: Osborne Oman, MD;  Location: [facility];  Service: Gynecology;  Laterality: Right;    Family History  Problem Relation Age of Onset  . Diabetes Other   . Coronary artery disease Brother        MI in his 50's  . Diabetes Brother     Social History   Socioeconomic History  . Marital status: Divorced    Spouse name: Not on file  . Number of children: Not on file  . Years of  education: Not on file  . Highest education level: Not on file  Occupational History  . Not on file  Tobacco Use  . Smoking status: Never Smoker  . Smokeless tobacco: Never Used  Substance and Sexual Activity  . Alcohol use: No  . Drug use: No  . Sexual activity: Not on file  Other Topics Concern  . Not on file  Social History Narrative  . Not on file   Social Determinants of Health   Financial Resource Strain:   . Difficulty of Paying Living Expenses: Not on file  Food Insecurity:   . Worried About Charity fundraiser in the Last Year: Not on file  . Ran Out of Food in the Last Year: Not on file  Transportation Needs:   . Lack of Transportation (Medical): Not on file  . Lack of Transportation (Non-Medical): Not on file  Physical Activity:   . Days of Exercise per Week: Not on file  . Minutes of Exercise per Session: Not on file  Stress:   . Feeling of Stress : Not on file  Social Connections:   . Frequency of Communication with Friends and Family: Not on file  . Frequency of Social Gatherings with Friends and Family: Not on file  . Attends Religious Services: Not on file  . Active Member of Clubs or  Organizations: Not on file  . Attends Archivist Meetings: Not on file  . Marital Status: Not on file  Intimate Partner Violence:   . Fear of Current or Ex-Partner: Not on file  . Emotionally Abused: Not on file  . Physically Abused: Not on file  . Sexually Abused: Not on file    ROS Review of Systems  Gastrointestinal: Positive for constipation.  Genitourinary:       Questionable uterus     Objective:   Today's Vitals: BP 133/72   Pulse 62   Temp 98.2 F (36.8 C)   Resp 20   Ht 5\' 7"  (1.702 m)   Wt 183 lb 12.8 oz (83.4 kg)   SpO2 100%   BMI 28.79 kg/m   Physical Exam HENT:     Head: Normocephalic and atraumatic.     Nose: Nose normal.     Mouth/Throat:     Mouth: Mucous membranes are moist.  Cardiovascular:     Rate and Rhythm: Normal  rate and regular rhythm.     Pulses: Normal pulses.     Heart sounds: Normal heart sounds.  Pulmonary:     Effort: Pulmonary effort is normal.     Breath sounds: Normal breath sounds.  Abdominal:     Comments: Increased abdominal girth Midline abdominal weakness questionable hernia  Musculoskeletal:        General: Normal range of motion.     Cervical back: Normal range of motion and neck supple.     Right lower leg: Edema present.     Left lower leg: Edema present.     Comments: Visual varicose left greater on the right  Skin:    Capillary Refill: Capillary refill takes more than 3 seconds.  Neurological:     General: No focal deficit present.     Mental Status: She is alert and oriented to person, place, and time.  Psychiatric:        Mood and Affect: Mood normal.        Behavior: Behavior normal.        Thought Content: Thought content normal.        Judgment: Judgment normal.     Assessment & Plan:   Problem List Items Addressed This Visit      Cardiovascular and Mediastinum   Right bundle branch block - Continue to follow-up with cardiology as scheduled    Other Visit Diagnoses    Screening for cholesterol level       Relevant Orders   POCT URINALYSIS DIP (CLINITEK) (Completed)   POCT glycosylated hemoglobin (Hb A1C)   Screening for diabetes mellitus Discussed dietary changes and regular exercise for prevention   Need for influenza vaccination       Encounter for hepatitis C screening test for low risk patient       Relevant Orders   Hepatitis C antibody (Completed)   Screening for HIV (human immunodeficiency virus)       Relevant Orders   HIV Antibody (routine testing w rflx) (Completed)   Screening for blood or protein in urine       Frequency of urination       Relevant Orders   Urine Culture   Vaginal lump       Relevant Orders   Ambulatory referral to Gynecology   Localized edema     Encourage patient to purchase some knee-high support socks and  change insoles every 2 to 3 months   Encounter to establish care  Long discussion with patient about overall health maintenance Discussed constipation and the use of stool softeners daily and MiraLAX as needed Encourage increase in daily water intake      Outpatient Encounter Medications as of 12/23/2019  Medication Sig  . estradiol (ESTRACE) 0.1 MG/GM vaginal cream estradiol 0.01% (0.1 mg/gram) vaginal cream  apply peas size amount to affected area as directed   No facility-administered encounter medications on file as of 12/23/2019.    Follow-up: No follow-ups on file.   Vevelyn Francois, NP

## 2019-12-24 ENCOUNTER — Other Ambulatory Visit: Payer: Self-pay | Admitting: Obstetrics and Gynecology

## 2019-12-24 DIAGNOSIS — Z01419 Encounter for gynecological examination (general) (routine) without abnormal findings: Secondary | ICD-10-CM | POA: Diagnosis not present

## 2019-12-24 DIAGNOSIS — Z1211 Encounter for screening for malignant neoplasm of colon: Secondary | ICD-10-CM | POA: Diagnosis not present

## 2019-12-24 DIAGNOSIS — N952 Postmenopausal atrophic vaginitis: Secondary | ICD-10-CM | POA: Diagnosis not present

## 2019-12-24 DIAGNOSIS — Z113 Encounter for screening for infections with a predominantly sexual mode of transmission: Secondary | ICD-10-CM | POA: Diagnosis not present

## 2019-12-24 DIAGNOSIS — N760 Acute vaginitis: Secondary | ICD-10-CM | POA: Diagnosis not present

## 2019-12-24 DIAGNOSIS — Z6828 Body mass index (BMI) 28.0-28.9, adult: Secondary | ICD-10-CM | POA: Diagnosis not present

## 2019-12-24 LAB — HEPATITIS C ANTIBODY: Hep C Virus Ab: <0.1 s/co ratio (ref 0.0–0.9)

## 2019-12-24 LAB — HIV ANTIBODY (ROUTINE TESTING W REFLEX): HIV Screen 4th Generation wRfx: NONREACTIVE

## 2019-12-24 MED FILL — ESTRADIOL 0.1 MG/GM CREA: 0.1 | 7 days supply | Qty: 43 | Fill #0

## 2019-12-25 ENCOUNTER — Encounter: Payer: Self-pay | Admitting: Gastroenterology

## 2019-12-25 ENCOUNTER — Encounter: Payer: Self-pay | Admitting: Nurse Practitioner

## 2019-12-25 ENCOUNTER — Other Ambulatory Visit: Payer: Self-pay | Admitting: Obstetrics and Gynecology

## 2019-12-25 MED FILL — FLUCONAZOLE 150 MG TABLET: 150 | 6 days supply | Qty: 2 | Fill #0

## 2019-12-26 LAB — URINE CULTURE

## 2019-12-30 DIAGNOSIS — Z7689 Persons encountering health services in other specified circumstances: Secondary | ICD-10-CM | POA: Diagnosis not present

## 2019-12-30 DIAGNOSIS — Z23 Encounter for immunization: Secondary | ICD-10-CM

## 2019-12-30 LAB — POCT GLYCOSYLATED HEMOGLOBIN (HGB A1C): Hemoglobin A1C: 5.3 % (ref 4.0–5.6)

## 2020-01-01 DIAGNOSIS — N8182 Incompetence or weakening of pubocervical tissue: Secondary | ICD-10-CM | POA: Diagnosis not present

## 2020-01-01 DIAGNOSIS — Z113 Encounter for screening for infections with a predominantly sexual mode of transmission: Secondary | ICD-10-CM | POA: Diagnosis not present

## 2020-01-15 ENCOUNTER — Ambulatory Visit: Payer: BC Managed Care – PPO | Admitting: Nurse Practitioner

## 2020-01-20 DIAGNOSIS — N8182 Incompetence or weakening of pubocervical tissue: Secondary | ICD-10-CM | POA: Diagnosis not present

## 2020-02-09 ENCOUNTER — Ambulatory Visit (AMBULATORY_SURGERY_CENTER): Payer: Self-pay | Admitting: *Deleted

## 2020-02-09 ENCOUNTER — Other Ambulatory Visit: Payer: Self-pay | Admitting: Gastroenterology

## 2020-02-09 ENCOUNTER — Other Ambulatory Visit: Payer: Self-pay

## 2020-02-09 VITALS — Ht 67.0 in | Wt 183.0 lb

## 2020-02-09 DIAGNOSIS — Z1211 Encounter for screening for malignant neoplasm of colon: Secondary | ICD-10-CM

## 2020-02-09 MED ORDER — NA SULFATE-K SULFATE-MG SULF 17.5-3.13-1.6 GM/177ML PO SOLN: ORAL | 0 refills | Status: DC

## 2020-02-09 MED FILL — SUPREP BOWEL PREP KIT: 17.5-3.13-1 | 1 days supply | Qty: 354 | Fill #0

## 2020-02-09 NOTE — Progress Notes (Signed)
Patient and spouse is here in-person for PV. Patient denies any allergies to eggs or soy. Patient denies any problems with anesthesia/sedation. Patient denies any oxygen use at home. Patient denies taking any diet/weight loss medications or blood thinners. Patient is not being treated for MRSA or C-diff. Patient is aware of our care-partner policy and Covid-19 safety protocol.   COVID-19 vaccines completed on 10/03/19 x2, per patient.

## 2020-02-23 ENCOUNTER — Other Ambulatory Visit: Payer: Self-pay | Admitting: Obstetrics and Gynecology

## 2020-02-23 DIAGNOSIS — Z13820 Encounter for screening for osteoporosis: Secondary | ICD-10-CM | POA: Diagnosis not present

## 2020-02-23 DIAGNOSIS — L292 Pruritus vulvae: Secondary | ICD-10-CM | POA: Diagnosis not present

## 2020-02-23 DIAGNOSIS — R102 Pelvic and perineal pain: Secondary | ICD-10-CM | POA: Diagnosis not present

## 2020-02-23 DIAGNOSIS — Z1231 Encounter for screening mammogram for malignant neoplasm of breast: Secondary | ICD-10-CM | POA: Diagnosis not present

## 2020-02-23 DIAGNOSIS — N819 Female genital prolapse, unspecified: Secondary | ICD-10-CM | POA: Diagnosis not present

## 2020-02-23 MED FILL — CLOBETASOL PROPIONATE 0.05: 0.05 | 20 days supply | Qty: 60 | Fill #0

## 2020-02-28 ENCOUNTER — Encounter: Payer: Self-pay | Admitting: Cardiology

## 2020-03-02 ENCOUNTER — Encounter: Payer: Self-pay | Admitting: Gastroenterology

## 2020-03-03 ENCOUNTER — Other Ambulatory Visit: Payer: Self-pay

## 2020-03-03 ENCOUNTER — Encounter: Payer: Self-pay | Admitting: Gastroenterology

## 2020-03-03 ENCOUNTER — Ambulatory Visit (AMBULATORY_SURGERY_CENTER): Payer: BC Managed Care – PPO | Admitting: Gastroenterology

## 2020-03-03 VITALS — BP 116/70 | HR 59 | Temp 97.8°F | Resp 9 | Ht 67.0 in | Wt 183.0 lb

## 2020-03-03 DIAGNOSIS — R102 Pelvic and perineal pain: Secondary | ICD-10-CM | POA: Diagnosis not present

## 2020-03-03 DIAGNOSIS — Z1211 Encounter for screening for malignant neoplasm of colon: Secondary | ICD-10-CM | POA: Diagnosis not present

## 2020-03-03 MED ORDER — SODIUM CHLORIDE 0.9 % IV SOLN: 500.0000 mL | Freq: Once | INTRAVENOUS | Status: DC

## 2020-03-03 NOTE — Patient Instructions (Signed)
YOU HAD AN ENDOSCOPIC PROCEDURE TODAY AT THE [facility]:   Refer to the procedure report that was given to you for any specific questions about what was found during the examination.  If the procedure report does not answer your questions, please call your gastroenterologist to clarify.  If you requested that your care partner not be given the details of your procedure findings, then the procedure report has been included in a sealed envelope for you to review at your convenience later.  YOU SHOULD EXPECT: Some feelings of bloating in the abdomen. Passage of more gas than usual.  Walking can help get rid of the air that was put into your GI tract during the procedure and reduce the bloating. If you had a lower endoscopy (such as a colonoscopy or flexible sigmoidoscopy) you may notice spotting of blood in your stool or on the toilet paper. If you underwent a bowel prep for your procedure, you may not have a normal bowel movement for a few days.  Please Note:  You might notice some irritation and congestion in your nose or some drainage.  This is from the oxygen used during your procedure.  There is no need for concern and it should clear up in a day or so.  SYMPTOMS TO REPORT IMMEDIATELY:   Following lower endoscopy (colonoscopy or flexible sigmoidoscopy):  Excessive amounts of blood in the stool  Significant tenderness or worsening of abdominal pains  Swelling of the abdomen that is new, acute  Fever of 100F or higher  For urgent or emergent issues, a gastroenterologist can be reached at any hour by calling [phone]. Do not use MyChart messaging for urgent concerns.    DIET:  We do recommend a small meal at first, but then you may proceed to your regular diet.  Drink plenty of fluids but you should avoid alcoholic beverages for 24 hours.  ACTIVITY:  You should plan to take it easy for the rest of today and you should NOT DRIVE or use heavy machinery until tomorrow (because  of the sedation medicines used during the test).    FOLLOW UP: Our staff will call the number listed on your records 48-72 hours following your procedure to check on you and address any questions or concerns that you may have regarding the information given to you following your procedure. If we do not reach you, we will leave a message.  We will attempt to reach you two times.  During this call, we will ask if you have developed any symptoms of COVID 19. If you develop any symptoms (ie: fever, flu-like symptoms, shortness of breath, cough etc.) before then, please call [phone].  If you test positive for Covid 19 in the 2 weeks post procedure, please call and report this information to us.    If any biopsies were taken you will be contacted by phone or by letter within the next 1-3 weeks.  Please call us at [phone] if you have not heard about the biopsies in 3 weeks.    SIGNATURES/CONFIDENTIALITY: You and/or your care partner have signed paperwork which will be entered into your electronic medical record.  These signatures attest to the fact that that the information above on your After Visit Summary has been reviewed and is understood.  Full responsibility of the confidentiality of this discharge information lies with you and/or your care-partner. 

## 2020-03-03 NOTE — Progress Notes (Signed)
Medical history reviewed with no changes since PV. VS assessed by C.W 

## 2020-03-03 NOTE — Progress Notes (Signed)
To PACU, VSS. Report to [email or web address] 

## 2020-03-03 NOTE — Op Note (Signed)
[facility] Patient Name: Madison Walker Procedure Date: 03/03/2020 9:50 AM MRN: [id number] Endoscopist: Remo Lipps P. Havery Moros , MD Age: 55 Referring MD:  Date of Birth: [birth year] Gender: Female Account #: [id number] Procedure:                Colonoscopy Indications:              Screening for colorectal malignant neoplasm Medicines:                Monitored Anesthesia Care Procedure:                Pre-Anesthesia Assessment:                           - Prior to the procedure, a History and Physical                            was performed, and patient medications and                            allergies were reviewed. The patient's tolerance of                            previous anesthesia was also reviewed. The risks                            and benefits of the procedure and the sedation                            options and risks were discussed with the patient.                            All questions were answered, and informed consent                            was obtained. Prior Anticoagulants: The patient has                            taken no previous anticoagulant or antiplatelet                            agents. ASA Grade Assessment: II - A patient with                            mild systemic disease. After reviewing the risks                            and benefits, the patient was deemed in                            satisfactory condition to undergo the procedure.                           After obtaining informed consent, the colonoscope  was passed under direct vision. Throughout the                            procedure, the patient's blood pressure, pulse, and                            oxygen saturations were monitored continuously. The                            Olympus PCF-H190DL [id number]) Colonoscope was                            introduced through the anus and advanced to the the                            cecum,  identified by appendiceal orifice and                            ileocecal valve. The colonoscopy was performed                            without difficulty. The patient tolerated the                            procedure well. The quality of the bowel                            preparation was good. The ileocecal valve,                            appendiceal orifice, and rectum were photographed. Scope In: 10:00:33 AM Scope Out: 10:20:39 AM Scope Withdrawal Time: 0 hours 17 minutes 16 seconds  Total Procedure Duration: 0 hours 20 minutes 6 seconds  Findings:                 The perianal and digital rectal examinations were                            normal.                           Internal hemorrhoids were found during                            retroflexion. The hemorrhoids were small.                           The exam was otherwise without abnormality. Complications:            No immediate complications. Estimated blood loss:                            None. Estimated Blood Loss:     Estimated blood loss: none. Impression:               - Internal hemorrhoids.                           -  The examination was otherwise normal.                           - No polyps Recommendation:           - Patient has a contact number available for                            emergencies. The signs and symptoms of potential                            delayed complications were discussed with the                            patient. Return to normal activities tomorrow.                            Written discharge instructions were provided to the                            patient.                           - Resume previous diet.                           - Continue present medications.                           - Repeat colonoscopy in 10 years for screening                            purposes. Remo Lipps P. Havery Moros, MD 03/03/2020 10:24:32 AM This report has been signed electronically.

## 2020-03-07 ENCOUNTER — Telehealth: Payer: Self-pay

## 2020-03-07 ENCOUNTER — Telehealth: Payer: Self-pay | Admitting: *Deleted

## 2020-03-07 NOTE — Telephone Encounter (Signed)
  Follow up Call-  Call back number 03/03/2020  Post procedure Call Back phone  # [phone]  Permission to leave phone message Yes  Some recent data might be hidden     Patient questions:  Do you have a fever, pain , or abdominal swelling? No. Pain Score  0 *  Have you tolerated food without any problems? yes  Have you been able to return to your normal activities? Yes.    Do you have any questions about your discharge instructions: Diet   No. Medications  No. Follow up visit  No.  Do you have questions or concerns about your Care? No.  Actions: * If pain score is 4 or above: No action needed, pain <4.

## 2020-03-07 NOTE — Telephone Encounter (Signed)
Called [phone] and left a message we tried to reach pt for a follow up call. maw

## 2020-03-16 DIAGNOSIS — M8580 Other specified disorders of bone density and structure, unspecified site: Secondary | ICD-10-CM | POA: Diagnosis not present

## 2020-03-17 ENCOUNTER — Encounter: Payer: Self-pay | Admitting: Nurse Practitioner

## 2020-03-17 ENCOUNTER — Other Ambulatory Visit: Payer: Self-pay

## 2020-03-17 ENCOUNTER — Ambulatory Visit (INDEPENDENT_AMBULATORY_CARE_PROVIDER_SITE_OTHER): Payer: BC Managed Care – PPO | Admitting: Nurse Practitioner

## 2020-03-17 VITALS — BP 141/77 | HR 71 | Temp 97.0°F | Ht 67.0 in | Wt 182.0 lb

## 2020-03-17 DIAGNOSIS — I8393 Asymptomatic varicose veins of bilateral lower extremities: Secondary | ICD-10-CM

## 2020-03-17 NOTE — Progress Notes (Signed)
[place], [place]  [address] Phone:  [phone]   Fax:  [phone]   Established Patient Office Visit  Subjective:  Patient ID: Madison Walker, female    DOB: 10-15-1965  Age: 55 y.o. MRN: [id number]  CC:  Chief Complaint  Patient presents with  . Cold Extremity    Having coldness in feet , wearing compression sock , hasn't help, she has tried sitting at heater hasn't help.off and on tingling in feet     HPI [name] presents for follow up. She  has a past medical history of Heart murmur.   She continues to have coldness in her feet. She has some tingling. She admits that this comes on intermittently with a pin type prick in her toe. She has used compression hose that has not been effective. She only has the during the cold temperatures. She has a varicose vein in the left leg that does swell on occasion. She denies headache, dizziness, visual changes, shortness of breath, dyspnea on exertion, chest pain, nausea, vomiting or any edema.   Past Medical History:  Diagnosis Date  . Heart murmur     Past Surgical History:  Procedure Laterality Date  . LAPAROSCOPIC UNILATERAL SALPINGO OOPHERECTOMY Left 05/31/2015   Procedure: LAPAROSCOPIC UNILATERAL SALPINGO OOPHORECTOMY;  Surgeon: Osborne Oman, MD;  Location: [facility];  Service: Gynecology;  Laterality: Left;  . TUBAL LIGATION    . UNILATERAL SALPINGECTOMY Right 05/31/2015   Procedure: UNILATERAL SALPINGECTOMY;  Surgeon: Osborne Oman, MD;  Location: [facility];  Service: Gynecology;  Laterality: Right;    Family History  Problem Relation Age of Onset  . Diabetes Other   . Coronary artery disease Brother        MI in his 42's  . Diabetes Brother   . Colon cancer Neg Hx   . Colon polyps Neg Hx   . Esophageal cancer Neg Hx   . Rectal cancer Neg Hx   . Stomach cancer Neg Hx     Social History   Socioeconomic History  . Marital status: Divorced    Spouse name: Not on  file  . Number of children: Not on file  . Years of education: Not on file  . Highest education level: Not on file  Occupational History  . Not on file  Tobacco Use  . Smoking status: Never Smoker  . Smokeless tobacco: Never Used  Vaping Use  . Vaping Use: Never used  Substance and Sexual Activity  . Alcohol use: No  . Drug use: No  . Sexual activity: Not on file  Other Topics Concern  . Not on file  Social History Narrative  . Not on file   Social Determinants of Health   Financial Resource Strain: Not on file  Food Insecurity: Not on file  Transportation Needs: Not on file  Physical Activity: Not on file  Stress: Not on file  Social Connections: Not on file  Intimate Partner Violence: Not on file    Outpatient Medications Prior to Visit  Medication Sig Dispense Refill  . estradiol (ESTRACE) 0.1 MG/GM vaginal cream estradiol 0.01% (0.1 mg/gram) vaginal cream  apply peas size amount to affected area as directed    . aspirin EC 81 MG tablet Take 81 mg by mouth daily. Swallow whole. (Patient not taking: No sig reported)     No facility-administered medications prior to visit.    Allergies  Allergen Reactions  . Iodine Itching and  Rash    Caused rash/hives after a surgery    ROS Review of Systems    Objective:    Physical Exam Constitutional:      Appearance: She is obese.  HENT:     Head: Normocephalic and atraumatic.     Nose: Nose normal.     Mouth/Throat:     Mouth: Mucous membranes are moist.  Cardiovascular:     Rate and Rhythm: Normal rate and regular rhythm.     Pulses: Normal pulses.     Heart sounds: Normal heart sounds.  Pulmonary:     Effort: Pulmonary effort is normal.     Breath sounds: Normal breath sounds.  Musculoskeletal:        General: Normal range of motion.     Cervical back: Normal range of motion.  Skin:    General: Skin is warm and dry.     Capillary Refill: Capillary refill takes less than 2 seconds.  Neurological:      General: No focal deficit present.     Mental Status: She is alert and oriented to person, place, and time.  Psychiatric:        Mood and Affect: Mood normal.        Behavior: Behavior normal.        Thought Content: Thought content normal.        Judgment: Judgment normal.     BP (!) 141/77 (BP Location: Left Arm, Patient Position: Sitting, Cuff Size: Normal)   Pulse 71   Temp (!) 97 F (36.1 C) (Temporal)   Ht 5\' 7"  (1.702 m)   Wt 182 lb (82.6 kg)   LMP 09/06/2014 (Approximate)   SpO2 98%   BMI 28.51 kg/m  Wt Readings from Last 3 Encounters:  03/17/20 182 lb (82.6 kg)  03/03/20 183 lb (83 kg)  02/09/20 183 lb (83 kg)     Health Maintenance Due  Topic Date Due  . PAP SMEAR-Modifier  Never done    There are no preventive care reminders to display for this patient.  Lab Results  Component Value Date   TSH 3.680 08/25/2019   Lab Results  Component Value Date   WBC 5.5 08/25/2019   HGB 12.9 08/25/2019   HCT 37.7 08/25/2019   MCV 98 (H) 08/25/2019   PLT 198 08/25/2019   Lab Results  Component Value Date   NA 142 08/25/2019   K 4.2 08/25/2019   CO2 23 08/25/2019   GLUCOSE 90 08/25/2019   BUN 22 08/25/2019   CREATININE 0.72 08/25/2019   BILITOT 0.5 11/29/2017   ALKPHOS 97 11/29/2017   AST 17 11/29/2017   ALT 16 11/29/2017   PROT 7.2 11/29/2017   ALBUMIN 4.7 11/29/2017   CALCIUM 9.3 08/25/2019   ANIONGAP 10 09/04/2017   Lab Results  Component Value Date   CHOL 152 11/29/2017   Lab Results  Component Value Date   HDL 45 11/29/2017   Lab Results  Component Value Date   LDLCALC 94 11/29/2017   Lab Results  Component Value Date   TRIG 63 11/29/2017   Lab Results  Component Value Date   CHOLHDL 3.4 11/29/2017   Lab Results  Component Value Date   HGBA1C 5.3 12/23/2019      Assessment & Plan:   Problem List Items Addressed This Visit   None   Visit Diagnoses    Varicose veins of both lower extremities, unspecified whether complicated     -  Primary Stable however concerning  to patient would like additional work up with image/US   Relevant Orders   Ambulatory referral to Vascular Surgery      No orders of the defined types were placed in this encounter.   Follow-up: Return in about 6 months (around 09/14/2020) for Physcial VISIT,EST,40-64 [[id number]].    Vevelyn Francois, NP

## 2020-03-17 NOTE — Patient Instructions (Signed)
Health Maintenance, Female Adopting a healthy lifestyle and getting preventive care are important in promoting health and wellness. Ask your health care provider about:  The right schedule for you to have regular tests and exams.  Things you can do on your own to prevent diseases and keep yourself healthy. What should I know about diet, weight, and exercise? Eat a healthy diet  Eat a diet that includes plenty of vegetables, fruits, low-fat dairy products, and lean protein.  Do not eat a lot of foods that are high in solid fats, added sugars, or sodium.   Maintain a healthy weight Body mass index (BMI) is used to identify weight problems. It estimates body fat based on height and weight. Your health care provider can help determine your BMI and help you achieve or maintain a healthy weight. Get regular exercise Get regular exercise. This is one of the most important things you can do for your health. Most adults should:  Exercise for at least 150 minutes each week. The exercise should increase your heart rate and make you sweat (moderate-intensity exercise).  Do strengthening exercises at least twice a week. This is in addition to the moderate-intensity exercise.  Spend less time sitting. Even light physical activity can be beneficial. Watch cholesterol and blood lipids Have your blood tested for lipids and cholesterol at 57 years of age, then have this test every 5 years. Have your cholesterol levels checked more often if:  Your lipid or cholesterol levels are high.  You are older than 57 years of age.  You are at high risk for heart disease. What should I know about cancer screening? Depending on your health history and family history, you may need to have cancer screening at various ages. This may include screening for:  Breast cancer.  Cervical cancer.  Colorectal cancer.  Skin cancer.  Lung cancer. What should I know about heart disease, diabetes, and high blood  pressure? Blood pressure and heart disease  High blood pressure causes heart disease and increases the risk of stroke. This is more likely to develop in people who have high blood pressure readings, are of African descent, or are overweight.  Have your blood pressure checked: ? Every 3-5 years if you are 18-39 years of age. ? Every year if you are 40 years old or older. Diabetes Have regular diabetes screenings. This checks your fasting blood sugar level. Have the screening done:  Once every three years after age 40 if you are at a normal weight and have a low risk for diabetes.  More often and at a younger age if you are overweight or have a high risk for diabetes. What should I know about preventing infection? Hepatitis B If you have a higher risk for hepatitis B, you should be screened for this virus. Talk with your health care provider to find out if you are at risk for hepatitis B infection. Hepatitis C Testing is recommended for:  Everyone born from 1945 through 1965.  Anyone with known risk factors for hepatitis C. Sexually transmitted infections (STIs)  Get screened for STIs, including gonorrhea and chlamydia, if: ? You are sexually active and are younger than 57 years of age. ? You are older than 57 years of age and your health care provider tells you that you are at risk for this type of infection. ? Your sexual activity has changed since you were last screened, and you are at increased risk for chlamydia or gonorrhea. Ask your health care provider   if you are at risk.  Ask your health care provider about whether you are at high risk for HIV. Your health care provider may recommend a prescription medicine to help prevent HIV infection. If you choose to take medicine to prevent HIV, you should first get tested for HIV. You should then be tested every 3 months for as long as you are taking the medicine. Pregnancy  If you are about to stop having your period (premenopausal) and  you may become pregnant, seek counseling before you get pregnant.  Take 400 to 800 micrograms (mcg) of folic acid every day if you become pregnant.  Ask for birth control (contraception) if you want to prevent pregnancy. Osteoporosis and menopause Osteoporosis is a disease in which the bones lose minerals and strength with aging. This can result in bone fractures. If you are 65 years old or older, or if you are at risk for osteoporosis and fractures, ask your health care provider if you should:  Be screened for bone loss.  Take a calcium or vitamin D supplement to lower your risk of fractures.  Be given hormone replacement therapy (HRT) to treat symptoms of menopause. Follow these instructions at home: Lifestyle  Do not use any products that contain nicotine or tobacco, such as cigarettes, e-cigarettes, and chewing tobacco. If you need help quitting, ask your health care provider.  Do not use street drugs.  Do not share needles.  Ask your health care provider for help if you need support or information about quitting drugs. Alcohol use  Do not drink alcohol if: ? Your health care provider tells you not to drink. ? You are pregnant, may be pregnant, or are planning to become pregnant.  If you drink alcohol: ? Limit how much you use to 0-1 drink a day. ? Limit intake if you are breastfeeding.  Be aware of how much alcohol is in your drink. In the [place] one drink equals one 12 oz bottle of beer (355 mL), one 5 oz glass of wine (148 mL), or one 1 oz glass of hard liquor (44 mL). General instructions  Schedule regular health, dental, and eye exams.  Stay current with your vaccines.  Tell your health care provider if: ? You often feel depressed. ? You have ever been abused or do not feel safe at home. Summary  Adopting a healthy lifestyle and getting preventive care are important in promoting health and wellness.  Follow your health care provider's instructions about healthy  diet, exercising, and getting tested or screened for diseases.  Follow your health care provider's instructions on monitoring your cholesterol and blood pressure. This information is not intended to replace advice given to you by your health care provider. Make sure you discuss any questions you have with your health care provider. Document Revised: 01/22/2018 Document Reviewed: 01/22/2018 Elsevier Patient Education  2021 Elsevier Inc.  

## 2020-03-18 ENCOUNTER — Other Ambulatory Visit: Payer: Self-pay | Admitting: Obstetrics and Gynecology

## 2020-03-22 ENCOUNTER — Other Ambulatory Visit: Payer: Self-pay | Admitting: Family Medicine

## 2020-03-22 DIAGNOSIS — I8393 Asymptomatic varicose veins of bilateral lower extremities: Secondary | ICD-10-CM

## 2020-03-22 DIAGNOSIS — I739 Peripheral vascular disease, unspecified: Secondary | ICD-10-CM

## 2020-03-23 MED FILL — VIT D2 1.25 MG (50,000 UNIT: 1.25 MG | 84 days supply | Qty: 12 | Fill #0

## 2020-03-29 ENCOUNTER — Other Ambulatory Visit: Payer: Self-pay

## 2020-03-29 DIAGNOSIS — I8390 Asymptomatic varicose veins of unspecified lower extremity: Secondary | ICD-10-CM

## 2020-04-05 DIAGNOSIS — E039 Hypothyroidism, unspecified: Secondary | ICD-10-CM | POA: Diagnosis not present

## 2020-04-06 ENCOUNTER — Other Ambulatory Visit: Payer: Self-pay | Admitting: Endocrinology

## 2020-04-06 MED FILL — LEVOTHYROXINE SODIUM 50 MCG: 50 | 30 days supply | Qty: 30 | Fill #0

## 2020-04-12 DIAGNOSIS — E063 Autoimmune thyroiditis: Secondary | ICD-10-CM | POA: Diagnosis not present

## 2020-04-12 DIAGNOSIS — R002 Palpitations: Secondary | ICD-10-CM | POA: Diagnosis not present

## 2020-04-12 DIAGNOSIS — E039 Hypothyroidism, unspecified: Secondary | ICD-10-CM | POA: Diagnosis not present

## 2020-04-13 ENCOUNTER — Other Ambulatory Visit: Payer: Self-pay

## 2020-04-13 ENCOUNTER — Ambulatory Visit (HOSPITAL_COMMUNITY): Payer: BC Managed Care – PPO

## 2020-04-13 ENCOUNTER — Ambulatory Visit (HOSPITAL_COMMUNITY)
Admission: RE | Admit: 2020-04-13 | Discharge: 2020-04-13 | Disposition: A | Discharge status: Home / Self Care | Payer: BC Managed Care – PPO | Source: Ambulatory Visit | Attending: Vascular Surgery | Admitting: Vascular Surgery

## 2020-04-13 DIAGNOSIS — I8390 Asymptomatic varicose veins of unspecified lower extremity: Secondary | ICD-10-CM | POA: Insufficient documentation

## 2020-04-14 ENCOUNTER — Encounter: Payer: Self-pay | Admitting: Vascular Surgery

## 2020-04-14 ENCOUNTER — Ambulatory Visit: Payer: BC Managed Care – PPO | Admitting: Vascular Surgery

## 2020-04-14 VITALS — Resp 16 | Ht 67.0 in | Wt 178.0 lb

## 2020-04-14 DIAGNOSIS — I83813 Varicose veins of bilateral lower extremities with pain: Secondary | ICD-10-CM

## 2020-04-14 DIAGNOSIS — I8393 Asymptomatic varicose veins of bilateral lower extremities: Secondary | ICD-10-CM

## 2020-04-14 NOTE — Progress Notes (Signed)
REASON FOR CONSULT:    Painful varicose veins bilaterally.  The consult is requested by Dionisio David, NP.  ASSESSMENT & PLAN:   PAINFUL VARICOSE VEINS BILATERAL LOWER EXTREMITIES: This patient has painful varicose veins of both lower extremities but more significantly on the left side.  She is having significant symptoms from venous hypertension.  She has CEAP C2 venous disease.  We have discussed the importance of intermittent leg elevation and the proper positioning for this.  We have had her fitted for thigh-high compression stockings with a gradient of 20 to 30 mmHg.  I have encouraged her to avoid prolonged sitting and standing.  We discussed the importance of exercise specifically walking and water aerobics.  She will take ibuprofen as needed for pain.  I will see her back in 3 months.  If her symptoms have not improved significantly I think she would be a candidate for laser ablation of the left great saphenous vein from the saphenofemoral junction to the junction of the mid to distal thigh where the vein exits the fascia.  She would also be a candidate for 10-20 stab phlebectomies and possibly 2 units of sclerotherapy.  Deitra Mayo, MD Office: [phone]   HPI:   Madison Walker is a pleasant 55 y.o. female, who is referred with bilateral painful varicose veins.  I have reviewed the records from the referring office.  The patient was seen on 03/17/2020.  She was having issues with her feet being cold.  She was noted to have significant varicose veins in the left leg and also some issues with swelling on the left side at times.  The patient was sent for a vascular evaluation.  On my history, the patient has had a long history of varicose veins of both lower extremities.  She is had these for several years.  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  She describes aching pain and heaviness in her legs which is aggravated by  standing and relieved with elevation.  Her symptoms are worse at the end of the day.  She is had no previous history of DVT.  She has had no previous vein procedures.  She just recently got some knee-high compression stockings with a gradient of 20 to 30 mmHg.  She does elevate her legs some.  She is also complaining of some cold feet.  She has no real risk factors for peripheral vascular disease.  I do not get any history of claudication, rest pain, or nonhealing ulcers.  She is not a smoker.  She does not have diabetes, hypertension, hypercholesterolemia, or family history of premature cardiovascular disease.  Past Medical History:  Diagnosis Date  . Heart murmur   . Varicose veins of bilateral lower extremities with other complications     Family History  Problem Relation Age of Onset  . Diabetes Other   . Coronary artery disease Brother        MI in his 3's  . Diabetes Brother   . COPD Mother   . Hernia Mother   . Diabetes Father   . COPD Father   . Colon cancer Neg Hx   . Colon polyps Neg Hx   . Esophageal cancer Neg Hx   . Rectal cancer Neg Hx   . Stomach cancer Neg Hx     SOCIAL HISTORY: Social History   Socioeconomic History  . Marital status: Divorced    Spouse  name: Not on file  . Number of children: Not on file  . Years of education: Not on file  . Highest education level: Not on file  Occupational History  . Not on file  Tobacco Use  . Smoking status: Never Smoker  . Smokeless tobacco: Never Used  Vaping Use  . Vaping Use: Never used  Substance and Sexual Activity  . Alcohol use: No  . Drug use: No  . Sexual activity: Not on file  Other Topics Concern  . Not on file  Social History Narrative  . Not on file   Social Determinants of Health   Financial Resource Strain: Not on file  Food Insecurity: Not on file  Transportation Needs: Not on file  Physical Activity: Not on file  Stress: Not on file  Social Connections: Not on file  Intimate Partner  Violence: Not on file    Allergies  Allergen Reactions  . Iodine Itching, Rash and Other (See Comments)    Caused rash/hives after a surgery    Current Outpatient Medications  Medication Sig Dispense Refill  . aspirin EC 81 MG tablet Take 81 mg by mouth daily. Swallow whole. (Patient not taking: No sig reported)    . estradiol (ESTRACE) 0.1 MG/GM vaginal cream estradiol 0.01% (0.1 mg/gram) vaginal cream  apply peas size amount to affected area as directed     No current facility-administered medications for this visit.    REVIEW OF SYSTEMS:  [X]  denotes positive finding, [ ]  denotes negative finding Cardiac  Comments:  Chest pain or chest pressure:    Shortness of breath upon exertion:    Short of breath when lying flat:    Irregular heart rhythm:        Vascular    Pain in calf, thigh, or hip brought on by ambulation:    Pain in feet at night that wakes you up from your sleep:     Blood clot in your veins:    Leg swelling:  x       Pulmonary    Oxygen at home:    Productive cough:     Wheezing:         Neurologic    Sudden weakness in arms or legs:     Sudden numbness in arms or legs:     Sudden onset of difficulty speaking or slurred speech:    Temporary loss of vision in one eye:     Problems with dizziness:         Gastrointestinal    Blood in stool:     Vomited blood:         Genitourinary    Burning when urinating:     Blood in urine:        Psychiatric    Major depression:         Hematologic    Bleeding problems:    Problems with blood clotting too easily:        Skin    Rashes or ulcers:        Constitutional    Fever or chills:     PHYSICAL EXAM:   Vitals:   04/14/20 1350  Resp: 16  Weight: 80.7 kg  Height: 5\' 7"  (1.702 m)    GENERAL: The patient is a well-nourished female, in no acute distress. The vital signs are documented above. CARDIAC: There is a regular rate and rhythm.  VASCULAR: I do not detect carotid bruits. On the  right side she has  a palpable femoral, dorsalis pedis, and posterior tibial pulse.  She has biphasic Doppler signals in the dorsalis pedis and posterior tibial positions on the right. On the left side she has a palpable femoral pulse and posterior tibial pulse.  I cannot palpate a dorsalis pedis pulse.  She does have a lateral tarsal signal with a Doppler. She has varicose veins bilaterally more significantly on the left side.  She has some large dilated varicose veins in her medial left calf.  Inner thighs she has telangiectasias and reticular veins as documented in the photographs below.        PULMONARY: There is good air exchange bilaterally without wheezing or rales. ABDOMEN: Soft and non-tender with normal pitched bowel sounds.  MUSCULOSKELETAL: There are no major deformities or cyanosis. NEUROLOGIC: No focal weakness or paresthesias are detected. SKIN: There are no ulcers or rashes noted. PSYCHIATRIC: The patient has a normal affect.  DATA:    VENOUS DUPLEX: I have independently interpreted her venous duplex scan.  On the right side there is no evidence of DVT or superficial venous thrombosis.  There is deep venous reflux involving the common femoral vein.  There is chronic thrombus noted in the small saphenous vein.  There is superficial venous reflux in the great saphenous vein in the distal thigh down to the proximal calf only.  The vein has diameters ranging from 0.32-0.62 cm.  On the left side there is no evidence of DVT or superficial venous thrombosis.  There is deep venous reflux involving the femoral vein.  There is superficial venous reflux involving the left great saphenous vein from the saphenofemoral junction of the proximal calf.  Diameters of the vein ranged from 0.54-0.71 cm.  Great saphenous vein exits the fascia in the distal thigh.

## 2020-04-17 NOTE — Progress Notes (Unsigned)
Cardiology Clinic Note   Patient Name: Madison Walker Date of Encounter: 04/19/2020  Primary Care Provider:  Vevelyn Francois, NP Primary Cardiologist:  Buford Dresser, MD  Patient Profile    Madison Walker 55 year old female presents the clinic today for an evaluation of her palpitations.  Past Medical History    Past Medical History:  Diagnosis Date  . Heart murmur   . Varicose veins of bilateral lower extremities with other complications    Past Surgical History:  Procedure Laterality Date  . LAPAROSCOPIC UNILATERAL SALPINGO OOPHERECTOMY Left 05/31/2015   Procedure: LAPAROSCOPIC UNILATERAL SALPINGO OOPHORECTOMY;  Surgeon: Osborne Oman, MD;  Location: [facility];  Service: Gynecology;  Laterality: Left;  . TUBAL LIGATION    . UNILATERAL SALPINGECTOMY Right 05/31/2015   Procedure: UNILATERAL SALPINGECTOMY;  Surgeon: Osborne Oman, MD;  Location: [facility];  Service: Gynecology;  Laterality: Right;    Allergies  Allergies  Allergen Reactions  . Iodine Itching, Rash and Other (See Comments)    Caused rash/hives after a surgery    History of Present Illness    Madison Walker 55 year old female with PMH of right bundle branch block, atypical chest pain, family history of heart disease, and ovarian dermoid cyst.  She was seen by Dr. Harrell Gave on 12/18/2019.  During that time she reported intermittent periods of aching at different locations in her chest.  These were at varying times throughout the day and were not present on other days.  The sensations last for around a minute.  There was no pattern or alleviating type factors.  She denied diaphoresis, syncope, nausea, increased work of breathing.  She reported rare occasions of lightheadedness.  She reported her palpitations were very brief.  She had tried to cut back on caffeine but she works third shift and was not able to stop caffeine completely.  She was seen by Jory Sims, DNP 08/25/2019.  She was sent for  echocardiogram 09/11/2019.  That showed LVEF 60 to 65%, mildly dilated right atria, mild bileaflet prolapse with mild posteriorly directed MR, and mild mitral valve regurgitation.  She denied lower extremity edema and unexpected weight gain.  Her chest pain was felt to be atypical.  She was reassured that her ASCVD score was very low.  A heart healthy Mediterranean diet was recommended along with exercise and she was instructed to follow-up in 2 years.  She presents the clinic today for follow-up evaluation states she noticed increased palpitations with her first dose of levothyroxine.  She discontinued the medication and has not had any further episodes of palpitations.  We reviewed her EKG today after echocardiogram.  Her EKG is unchanged from previous.  It will be okay for her to restart a lower dose of levothyroxine.  I will have her continue her physical activity and heart healthy diet.  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  We will plan to see her back in 12 to 18 months.  Today she denies chest pain, shortness of breath, lower extremity edema, fatigue, palpitations, melena, hematuria, hemoptysis, diaphoresis, weakness, presyncope, syncope, orthopnea, and PND.   Home Medications    Prior to Admission medications   Medication Sig Start Date End Date Taking? Authorizing Provider  aspirin EC 81 MG tablet Take 81 mg by mouth daily. Swallow whole. Patient not taking: No sig reported    [provider]  estradiol (ESTRACE) 0.1 MG/GM vaginal cream estradiol 0.01% (0.1 mg/gram) vaginal cream  apply peas  size amount to affected area as directed    [provider]    Family History    Family History  Problem Relation Age of Onset  . Diabetes Other   . Coronary artery disease Brother        MI in his 84's  . Diabetes Brother   . COPD Mother   . Hernia Mother   . Diabetes Father   . COPD Father   . Colon cancer Neg Hx   .  Colon polyps Neg Hx   . Esophageal cancer Neg Hx   . Rectal cancer Neg Hx   . Stomach cancer Neg Hx    She indicated that her mother is alive. She indicated that her father is deceased. She indicated that the status of her brother is unknown. She indicated that the status of her neg hx is unknown. She indicated that the status of her other is unknown.  Social History    Social History   Socioeconomic History  . Marital status: Divorced    Spouse name: Not on file  . Number of children: Not on file  . Years of education: Not on file  . Highest education level: Not on file  Occupational History  . Not on file  Tobacco Use  . Smoking status: Never Smoker  . Smokeless tobacco: Never Used  Vaping Use  . Vaping Use: Never used  Substance and Sexual Activity  . Alcohol use: No  . Drug use: No  . Sexual activity: Not on file  Other Topics Concern  . Not on file  Social History Narrative  . Not on file   Social Determinants of Health   Financial Resource Strain: Not on file  Food Insecurity: Not on file  Transportation Needs: Not on file  Physical Activity: Not on file  Stress: Not on file  Social Connections: Not on file  Intimate Partner Violence: Not on file     Review of Systems    General:  No chills, fever, night sweats or weight changes.  Cardiovascular:  No chest pain, dyspnea on exertion, edema, orthopnea, palpitations, paroxysmal nocturnal dyspnea. Dermatological: No rash, lesions/masses Respiratory: No cough, dyspnea Urologic: No hematuria, dysuria Abdominal:   No nausea, vomiting, diarrhea, bright red blood per rectum, melena, or hematemesis Neurologic:  No visual changes, wkns, changes in mental status. All other systems reviewed and are otherwise negative except as noted above.  Physical Exam    VS:  BP 132/78   Ht 5\' 7"  (1.702 m)   Wt 182 lb 6.4 oz (82.7 kg)   LMP 09/06/2014 (Approximate)   BMI 28.57 kg/m  , BMI Body mass index is 28.57 kg/m. GEN:  Well nourished, well developed, in no acute distress. HEENT: normal. Neck: Supple, no JVD, carotid bruits, or masses. Cardiac: RRR, no murmurs, rubs, or gallops. No clubbing, cyanosis, edema.  Radials/DP/PT 2+ and equal bilaterally.  Respiratory:  Respirations regular and unlabored, clear to auscultation bilaterally. GI: Soft, nontender, nondistended, BS + x 4. MS: no deformity or atrophy. Skin: warm and dry, no rash. Neuro:  Strength and sensation are intact. Psych: Normal affect.  Accessory Clinical Findings    Recent Labs: 08/25/2019: BUN 22; Creatinine, Ser 0.72; Hemoglobin 12.9; Magnesium 2.1; Platelets 198; Potassium 4.2; Sodium 142; TSH 3.680   Recent Lipid Panel    Component Value Date/Time   CHOL 152 11/29/2017 1502   TRIG 63 11/29/2017 1502   HDL 45 11/29/2017 1502   CHOLHDL 3.4 11/29/2017 1502  [place] 94 11/29/2017 1502    ECG personally reviewed by me today-sinus rhythm with first-degree AV block right bundle branch block 61 bpm- No acute changes  Echocardiogram 09/11/2019 IMPRESSIONS    1. Left ventricular ejection fraction, by estimation, is 60 to 65%. The  left ventricle has normal function. The left ventricle has no regional  wall motion abnormalities. Left ventricular diastolic parameters were  normal.  2. Right ventricular systolic function is moderately reduced. The right  ventricular size is moderately enlarged. There is mildly elevated  pulmonary artery systolic pressure.  3. Note well seen no obvious PFO/ASD to explain RVE suggest bubble study  to further evaluate.  4. Right atrial size was mildly dilated.  5. Mild bi leaflet prolapse with mild posteriorly directed MR . The  mitral valve is abnormal. Mild mitral valve regurgitation. No evidence of  mitral stenosis.  6. The aortic valve is normal in structure. Aortic valve regurgitation is  not visualized. No aortic stenosis is present.  7. The inferior vena cava is normal in size with  greater than 50%  respiratory variability, suggesting right atrial pressure of 3 mmHg.  ETT 11/29/2017  There was no ST segment deviation noted during stress.  No T wave inversion was noted during stress.  Blood pressure demonstrated a normal response to exercise.   Normal ECG stress test.   Assessment & Plan   1.  Palpitations-reports occasional irregular heartbeats.  No change from previous evaluation.  EKG shows sinus rhythm with first-degree AV block right bundle branch block 61 bpm.  TSH 08/25/2019 unremarkable. Continue ASA Heart healthy low-sodium diet-salty 6 given Increase physical activity as tolerated Avoid triggers caffeine, chocolate, EtOH, dehydration, OTC decongestants etc.   Atypical chest pain-no chest pain today.  Random, wondering chest aches with no alleviating or aggravating factors.  Stress test 2019 normal.  Echocardiogram 7/21 showed mild mitral abnormalities. Continue ASA.   Heart healthy low-sodium diet-salty 6 given Increase physical activity as tolerated   Disposition: Follow-up with Dr. Harrell Gave in 12 to 18 months.  Jossie Ng. Cleaver NP-C    04/19/2020, 11:11 AM [place] [address] Office [phone] Fax [phone]  Notice: This dictation was prepared with Dragon dictation along with smaller phrase technology. Any transcriptional errors that result from this process are unintentional and may not be corrected upon review.  I spent 10 minutes examining this patient, reviewing medications, and using patient centered shared decision making involving her cardiac care.  Prior to her visit I spent greater than 20 minutes reviewing her past medical history,  medications, and prior cardiac tests.

## 2020-04-19 ENCOUNTER — Other Ambulatory Visit: Payer: Self-pay

## 2020-04-19 ENCOUNTER — Ambulatory Visit: Payer: BC Managed Care – PPO | Admitting: General Practice

## 2020-04-19 ENCOUNTER — Encounter: Payer: Self-pay | Admitting: General Practice

## 2020-04-19 VITALS — BP 132/78 | Ht 67.0 in | Wt 182.4 lb

## 2020-04-19 DIAGNOSIS — I4510 Unspecified right bundle-branch block: Secondary | ICD-10-CM | POA: Diagnosis not present

## 2020-04-19 DIAGNOSIS — R002 Palpitations: Secondary | ICD-10-CM

## 2020-04-19 DIAGNOSIS — R0789 Other chest pain: Secondary | ICD-10-CM | POA: Diagnosis not present

## 2020-04-19 NOTE — Patient Instructions (Addendum)
Medication Instructions:  Continue current medications  *If you need a refill on your cardiac medications before your next appointment, please call your pharmacy*  Follow-Up: At [facility], you and your health needs are our priority.  As part of our continuing mission to provide you with exceptional heart care, we have created designated Provider Care Teams.  These Care Teams include your primary Cardiologist (physician) and Advanced Practice Providers (APPs -  Physician Assistants and Nurse Practitioners) who all work together to provide you with the care you need, when you need it.  Your next appointment:   12-18 month(s)  The format for your next appointment:   In Person  Provider:   You may see Buford Dresser, MD or one of the following Advanced Practice Providers on your designated Care Team:  Rosaria Ferries, PA-C  Or Jory Sims, DNP, ANP

## 2020-04-20 ENCOUNTER — Other Ambulatory Visit: Payer: Self-pay | Admitting: Endocrinology

## 2020-04-21 MED FILL — LEVOTHYROXINE SODIUM 25 MCG: 25 | 30 days supply | Qty: 30 | Fill #0

## 2020-05-16 ENCOUNTER — Other Ambulatory Visit: Payer: Self-pay

## 2020-05-16 MED FILL — Levothyroxine Sodium Tab 25 MCG: ORAL | 30 days supply | Qty: 30 | Fill #0 | Status: AC

## 2020-05-25 ENCOUNTER — Other Ambulatory Visit: Payer: Self-pay

## 2020-06-13 ENCOUNTER — Other Ambulatory Visit: Payer: Self-pay

## 2020-06-13 MED FILL — Levothyroxine Sodium Tab 25 MCG: ORAL | 30 days supply | Qty: 30 | Fill #1 | Status: AC

## 2020-06-14 DIAGNOSIS — E559 Vitamin D deficiency, unspecified: Secondary | ICD-10-CM | POA: Diagnosis not present

## 2020-06-17 DIAGNOSIS — E039 Hypothyroidism, unspecified: Secondary | ICD-10-CM | POA: Diagnosis not present

## 2020-06-17 DIAGNOSIS — E063 Autoimmune thyroiditis: Secondary | ICD-10-CM | POA: Diagnosis not present

## 2020-06-22 ENCOUNTER — Ambulatory Visit: Payer: BC Managed Care – PPO | Admitting: Nurse Practitioner

## 2020-07-21 ENCOUNTER — Ambulatory Visit: Payer: BC Managed Care – PPO | Admitting: Vascular Surgery

## 2020-07-25 ENCOUNTER — Encounter: Payer: Self-pay | Admitting: Nurse Practitioner

## 2020-07-25 ENCOUNTER — Ambulatory Visit (INDEPENDENT_AMBULATORY_CARE_PROVIDER_SITE_OTHER): Payer: BC Managed Care – PPO | Admitting: Nurse Practitioner

## 2020-07-25 ENCOUNTER — Other Ambulatory Visit: Payer: Self-pay

## 2020-07-25 VITALS — BP 147/77 | HR 72 | Temp 97.3°F | Ht 67.0 in | Wt 182.0 lb

## 2020-07-25 DIAGNOSIS — Z5320 Procedure and treatment not carried out because of patient's decision for unspecified reasons: Secondary | ICD-10-CM | POA: Diagnosis not present

## 2020-07-25 DIAGNOSIS — R0982 Postnasal drip: Secondary | ICD-10-CM

## 2020-07-25 DIAGNOSIS — J309 Allergic rhinitis, unspecified: Secondary | ICD-10-CM | POA: Diagnosis not present

## 2020-07-25 DIAGNOSIS — Z0000 Encounter for general adult medical examination without abnormal findings: Secondary | ICD-10-CM

## 2020-07-25 LAB — POCT URINALYSIS DIPSTICK
Bilirubin, UA: NEGATIVE
Blood, UA: NEGATIVE
Glucose, UA: NEGATIVE
Ketones, UA: NEGATIVE
Leukocytes, UA: NEGATIVE
Nitrite, UA: NEGATIVE
Protein, UA: NEGATIVE
Spec Grav, UA: >=1.03 — AB (ref 1.010–1.025)
Urobilinogen, UA: 0.2 E.U./dL
pH, UA: 6 (ref 5.0–8.0)

## 2020-07-25 NOTE — Progress Notes (Signed)
[place], [place]  [address] Phone:  [phone]   Fax:  [phone]   Acute Office Visit  Subjective:    Patient ID: Madison Walker, female    DOB: 1966/01/20, 55 y.o.   MRN: [id number]  Chief Complaint  Patient presents with   Annual Exam    Sinuitis, allergies    HPI Patient is in today for sinus symptoms. She  has a past medical history of Heart murmur and Varicose veins of bilateral lower extremities with other complications.   Patient was to have a physical and is requesting this to be coded as a physical however declined physical examination with clinical breast exam and Pap test.  Sinus Pain Patient complains of cough and post nasal drip. Onset of symptoms was 2 days ago. Symptoms have been unchanged since that time. She is drinking plenty of fluids.  Past history is significant for nothing. Patient is non-smoker. 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  Past Medical History:  Diagnosis Date   Heart murmur    Varicose veins of bilateral lower extremities with other complications     Past Surgical History:  Procedure Laterality Date   LAPAROSCOPIC UNILATERAL SALPINGO OOPHERECTOMY Left 05/31/2015   Procedure: LAPAROSCOPIC UNILATERAL SALPINGO OOPHORECTOMY;  Surgeon: Osborne Oman, MD;  Location: [facility];  Service: Gynecology;  Laterality: Left;   TUBAL LIGATION     UNILATERAL SALPINGECTOMY Right 05/31/2015   Procedure: UNILATERAL SALPINGECTOMY;  Surgeon: Osborne Oman, MD;  Location: [facility];  Service: Gynecology;  Laterality: Right;    Family History  Problem Relation Age of Onset   Diabetes Other    Coronary artery disease Brother        MI in his 39's   Diabetes Brother    COPD Mother    Hernia Mother    Diabetes Father    COPD Father    Colon cancer Neg Hx    Colon polyps Neg Hx    Esophageal cancer Neg Hx    Rectal cancer Neg Hx    Stomach cancer Neg Hx     Social History   Socioeconomic History    Marital status: Divorced    Spouse name: Not on file   Number of children: Not on file   Years of education: Not on file   Highest education level: Not on file  Occupational History   Not on file  Tobacco Use   Smoking status: Never   Smokeless tobacco: Never  Vaping Use   Vaping Use: Never used  Substance and Sexual Activity   Alcohol use: No   Drug use: No   Sexual activity: Not on file  Other Topics Concern   Not on file  Social History Narrative   Not on file   Social Determinants of Health   Financial Resource Strain: Not on file  Food Insecurity: Not on file  Transportation Needs: Not on file  Physical Activity: Not on file  Stress: Not on file  Social Connections: Not on file  Intimate Partner Violence: Not on file    Outpatient Medications Prior to Visit  Medication Sig Dispense Refill   aspirin EC 81 MG tablet Take 81 mg by mouth daily. Swallow whole.     clobetasol ointment (TEMOVATE) 0.05 % APPLY A THIN LAYER TO THE AFFECTED AREA(S) BY TOPICAL ROUTE 2 TIMES PER DAY 30 g 1   levothyroxine (SYNTHROID) 25 MCG tablet TAKE 1 TABLET BY MOUTH IN THE  MORNING ON AN EMPTY STOMACH ONCE A DAY 30 tablet 6   levothyroxine (SYNTHROID) 50 MCG tablet TAKE 1 TABLET BY MOUTH IN THE MORNING ON AN EMPTY STOMACH 30 tablet 1   Vitamin D, Ergocalciferol, (DRISDOL) 1.25 MG (50000 UNIT) CAPS capsule TAKE 1 CAPSULE BY MOUTH EVERY WEEK 12 capsule 0   estradiol (ESTRACE) 0.1 MG/GM vaginal cream estradiol 0.01% (0.1 mg/gram) vaginal cream  apply peas size amount to affected area as directed     Cholecalciferol 1.25 MG (50000 UT) capsule cholecalciferol (vitamin D3) 1,250 mcg (50,000 unit) capsule  Take 1 capsule every week by oral route for 90 days. (Patient not taking: Reported on 07/25/2020)     estradiol (ESTRACE) 0.1 MG/GM vaginal cream APPLY PEAS SIZE AMOUNT TO AFFECTED AREA AS DIRECTED 42.5 g 2   fluconazole (DIFLUCAN) 150 MG tablet TAKE 1 TABLET BY MOUTH AND REPEAT IN 3 DAYS 2 tablet 1    levothyroxine (SYNTHROID) 50 MCG tablet TAKE 1 TABLET BY MOUTH IN THE MORNING ON AN EMPTY STOMACH     No facility-administered medications prior to visit.    Allergies  Allergen Reactions   Iodine Itching, Rash and Other (See Comments)    Caused rash/hives after a surgery    Review of Systems     Objective:    Physical Exam Constitutional:      General: She is not in acute distress. HENT:     Head: Normocephalic and atraumatic.     Right Ear: Tympanic membrane normal.     Left Ear: Tympanic membrane normal.     Nose: Nose normal.     Mouth/Throat:     Mouth: Mucous membranes are moist.  Cardiovascular:     Rate and Rhythm: Normal rate and regular rhythm.     Pulses: Normal pulses.     Heart sounds: Normal heart sounds.  Pulmonary:     Effort: Pulmonary effort is normal.     Breath sounds: Rhonchi present.  Musculoskeletal:     Cervical back: Normal range of motion.  Skin:    General: Skin is warm and dry.     Capillary Refill: Capillary refill takes less than 2 seconds.  Neurological:     General: No focal deficit present.     Mental Status: She is alert and oriented to person, place, and time.    BP (!) 147/77 (BP Location: Left Arm, Patient Position: Sitting)   Pulse 72   Temp (!) 97.3 F (36.3 C)   Ht 5\' 7"  (1.702 m)   Wt 182 lb 0.6 oz (82.6 kg)   LMP 09/06/2014 (Approximate)   BMI 28.51 kg/m  Wt Readings from Last 3 Encounters:  07/25/20 182 lb 0.6 oz (82.6 kg)  04/19/20 182 lb 6.4 oz (82.7 kg)  04/14/20 178 lb (80.7 kg)    Health Maintenance Due  Topic Date Due   Pneumococcal Vaccine 32-5 Years old (1 - PCV) Never done   Zoster Vaccines- Shingrix (1 of 2) Never done   PAP SMEAR-Modifier  Never done   COVID-19 Vaccine (3 - Pfizer risk series) 10/31/2019    There are no preventive care reminders to display for this patient.   Lab Results  Component Value Date   TSH 3.680 08/25/2019   Lab Results  Component Value Date   WBC 5.5  08/25/2019   HGB 12.9 08/25/2019   HCT 37.7 08/25/2019   MCV 98 (H) 08/25/2019   PLT 198 08/25/2019   Lab Results  Component Value Date  NA 142 08/25/2019   K 4.2 08/25/2019   CO2 23 08/25/2019   GLUCOSE 90 08/25/2019   BUN 22 08/25/2019   CREATININE 0.72 08/25/2019   BILITOT 0.5 11/29/2017   ALKPHOS 97 11/29/2017   AST 17 11/29/2017   ALT 16 11/29/2017   PROT 7.2 11/29/2017   ALBUMIN 4.7 11/29/2017   CALCIUM 9.3 08/25/2019   ANIONGAP 10 09/04/2017   Lab Results  Component Value Date   CHOL 152 11/29/2017   Lab Results  Component Value Date   HDL 45 11/29/2017   Lab Results  Component Value Date   LDLCALC 94 11/29/2017   Lab Results  Component Value Date   TRIG 63 11/29/2017   Lab Results  Component Value Date   CHOLHDL 3.4 11/29/2017   Lab Results  Component Value Date   HGBA1C 5.3 12/23/2019       Assessment & Plan:   Problem List Items Addressed This Visit   None Visit Diagnoses     Post-nasal drainage    -  Primary Encourage regular allergic treatment  48 hour of Mucinex D if not effective possible anbx however not needed today    Allergic rhinitis, unspecified seasonality, unspecified trigger     Allergy treatment daily recommended         No orders of the defined types were placed in this encounter.    Vevelyn Francois, NP

## 2020-07-25 NOTE — Patient Instructions (Signed)
Allergic Rhinitis, Adult Allergic rhinitis is a reaction to allergens. Allergens are things that can cause an allergic reaction. This condition affects the lining inside the nose (mucous membrane). There are two types of allergic rhinitis: Seasonal. This type is also called hay fever. It happens only during some times of the year. Perennial. This type can happen at any time of the year. This condition cannot be spread from person to person (is not contagious). It can be mild, worse, or very bad. It can develop at any age and may beoutgrown. What are the causes? This condition may be caused by: Pollen from grasses, trees, and weeds. Dust mites. Smoke. Mold. Car fumes. The pee (urine), spit, or dander of pets. Dander is dead skin cells from a pet. What increases the risk? You are more likely to develop this condition if: You have allergies in your family. You have problems like allergies in your family. You may have: Swelling of parts of your eyes and eyelids. Asthma. This affects how you breathe. Long-term redness and swelling on your skin. Food allergies. What are the signs or symptoms? The main symptom of this condition is a runny or stuffy nose (nasal congestion). Other symptoms may include: Sneezing or coughing. Itching and tearing of your eyes. Mucus that drips down the back of your throat (postnasal drip). Trouble sleeping. Feeling tired. Headache. Sore throat. How is this treated? There is no cure for this condition. You should avoid things that you are allergic to. Treatment can help to relieve symptoms. This may include: Medicines that block allergy symptoms, such as corticosteroids or antihistamines. These may be given as a shot, nasal spray, or pill. Avoiding things you are allergic to. Medicines that give you bits of what you are allergic to over time. This is called immunotherapy. It is done if other treatments do not help. You may get: Shots. Medicine under your  tongue. Stronger medicines, if other treatments do not help. Follow these instructions at home: Avoiding allergens Find out what things you are allergic to and avoid them. To do this, try these things: If you get allergies any time of year: Replace carpet with wood, tile, or vinyl flooring. Carpet can trap pet dander and dust. Do not smoke. Do not allow smoking in your home. Change your heating and air conditioning filters at least once a month. If you get allergies only some times of the year: Keep windows closed when you can. Plan things to do outside when pollen counts are lowest. Check pollen counts before you plan things to do outside. When you come indoors, change your clothes and shower before you sit on furniture or bedding. If you are allergic to a pet: Keep the pet out of your bedroom. Vacuum, sweep, and dust often.  General instructions Take over-the-counter and prescription medicines only as told by your doctor. Drink enough fluid to keep your pee (urine) pale yellow. Keep all follow-up visits as told by your doctor. This is important. Where to find more information American Academy of Allergy, Asthma & Immunology: www.aaaai.org Contact a doctor if: You have a fever. You get a cough that does not go away. You make whistling sounds when you breathe (wheeze). Your symptoms slow you down. Your symptoms stop you from doing your normal things each day. Get help right away if: You are short of breath. This symptom may be an emergency. Do not wait to see if the symptom will go away. Get medical help right away. Call your local emergency   services (911 in the [place] Do not drive yourself to the hospital. Summary Allergic rhinitis may be treated by taking medicines and avoiding things you are allergic to. If you have allergies only some of the year, keep windows closed when you can at those times. Contact your doctor if you get a fever or a cough that does not go away. This  information is not intended to replace advice given to you by your health care provider. Make sure you discuss any questions you have with your healthcare provider. Document Revised: 03/23/2019 Document Reviewed: 01/27/2019 Elsevier Patient Education  2022 Elsevier Inc.  

## 2020-07-29 ENCOUNTER — Telehealth: Payer: Self-pay | Admitting: Nurse Practitioner

## 2020-07-29 ENCOUNTER — Other Ambulatory Visit: Payer: Self-pay

## 2020-07-29 ENCOUNTER — Telehealth: Payer: Self-pay

## 2020-07-29 NOTE — Telephone Encounter (Signed)
Please call pt in regards to message this morning.

## 2020-07-29 NOTE — Telephone Encounter (Signed)
Message sent to provider 

## 2020-07-29 NOTE — Telephone Encounter (Signed)
Patient states that [facility] told her to try Mucinex D for allergy symptoms, but it is not working and she needs an antibiotic. Please advise.  [facility]

## 2020-07-30 ENCOUNTER — Encounter (HOSPITAL_COMMUNITY): Payer: Self-pay

## 2020-07-30 ENCOUNTER — Ambulatory Visit (HOSPITAL_COMMUNITY)
Admission: EM | Admit: 2020-07-30 | Discharge: 2020-07-30 | Disposition: A | Discharge status: Home / Self Care | Payer: BC Managed Care – PPO | Attending: Family Medicine | Admitting: Family Medicine

## 2020-07-30 DIAGNOSIS — J0100 Acute maxillary sinusitis, unspecified: Secondary | ICD-10-CM | POA: Diagnosis not present

## 2020-07-30 DIAGNOSIS — H1033 Unspecified acute conjunctivitis, bilateral: Secondary | ICD-10-CM

## 2020-07-30 MED ORDER — OLOPATADINE HCL 0.2 % OP SOLN: 1.0000 [drp] | Freq: Every day | OPHTHALMIC | 0 refills | Status: DC

## 2020-07-30 MED ORDER — AMOXICILLIN-POT CLAVULANATE 875-125 MG PO TABS: 1.0000 | ORAL_TABLET | Freq: Two times a day (BID) | ORAL | 0 refills | Status: DC

## 2020-07-30 NOTE — ED Provider Notes (Addendum)
Madison Walker   [id number] 07/30/20 Arrival Time: 3382  ASSESSMENT & PLAN:  1. Acute non-recurrent maxillary sinusitis   2. Acute conjunctivitis of both eyes, unspecified acute conjunctivitis type    Begin: Meds ordered this encounter  Medications   Olopatadine HCl 0.2 % SOLN    Sig: Apply 1 drop to eye daily.    Dispense:  2.5 mL    Refill:  0   amoxicillin-clavulanate (AUGMENTIN) 875-125 MG tablet    Sig: Take 1 tablet by mouth every 12 (twelve) hours.    Dispense:  20 tablet    Refill:  0   OTC symptom care as needed. Ensure adequate fluid intake and rest.   Follow-up Information     Vevelyn Francois, NP.   Specialty: Adult Health Nurse Practitioner Why: As needed. Contact information: [address] Madison Walker Madison Walker [address] [phone]                 Reviewed expectations re: course of current medical issues. Questions answered. Outlined signs and symptoms indicating need for more acute intervention. Patient verbalized understanding. After Visit Summary given.   SUBJECTIVE: History from: patient.  Madison Walker is a 55 y.o. female who presents with complaint of nasal congestion, post-nasal drainage, and sinus pain. Onset gradual,  between 1-2 w ago . Respiratory symptoms: occas dry cough but improving. Fever: absent. Overall normal PO intake without n/v. OTC treatment: Allegra and Mucinex with minimal relief. Seasonal allergies: no. History of frequent sinus infections: no. No specific aggravating or alleviating factors reported. Social History   Tobacco Use  Smoking Status Never  Smokeless Tobacco Never   Also noted bilateral "eye irriation" yesterday. No eye pain. Watery drainage.  OBJECTIVE:  Vitals:   07/30/20 1250  BP: (!) 160/89  Pulse: 72  Resp: 18  Temp: 99.1 F (37.3 C)  TempSrc: Oral  SpO2: 100%     General appearance: alert; no distress HEENT: nasal congestion; clear runny nose; throat irritation secondary to  post-nasal drainage; frontal tenderness to palpation; turbinates boggy; bilateral mild conjunctival injection with watery drainage Neck: supple without LAD; trachea midline Lungs: unlabored respirations, symmetrical air entry; cough: dry; no respiratory distress; CTAB; no wheezing Skin: warm and dry Psychological: alert and cooperative; normal mood and affect  Allergies  Allergen Reactions   Iodine Itching, Rash and Other (See Comments)    Caused rash/hives after a surgery    Past Medical History:  Diagnosis Date   Heart murmur    Varicose veins of bilateral lower extremities with other complications    Family History  Problem Relation Age of Onset   Diabetes Other    Coronary artery disease Brother        MI in his 70's   Diabetes Brother    COPD Mother    Hernia Mother    Diabetes Father    COPD Father    Colon cancer Neg Hx    Colon polyps Neg Hx    Esophageal cancer Neg Hx    Rectal cancer Neg Hx    Stomach cancer Neg Hx    Social History   Socioeconomic History   Marital status: Divorced    Spouse name: Not on file   Number of children: Not on file   Years of education: Not on file   Highest education level: Not on file  Occupational History   Not on file  Tobacco Use   Smoking status: Never   Smokeless tobacco: Never  Vaping Use  Vaping Use: Never used  Substance and Sexual Activity   Alcohol use: No   Drug use: No   Sexual activity: Not on file  Other Topics Concern   Not on file  Social History Narrative   Not on file   Social Determinants of Health   Financial Resource Strain: Not on file  Food Insecurity: Not on file  Transportation Needs: Not on file  Physical Activity: Not on file  Stress: Not on file  Social Connections: Not on file  Intimate Partner Violence: Not on file             Vanessa Kick, MD 07/30/20 [place], [place], MD 07/30/20 1345

## 2020-07-30 NOTE — ED Triage Notes (Signed)
Pt reports cough, nasal congestion, scratchy throat, body aches x 1 week; burning, redness and itchiness redness in the left eye. Denies fever, shortness of breath.Allegra and Mucinex gives no relief.

## 2020-08-02 ENCOUNTER — Other Ambulatory Visit: Payer: Self-pay

## 2020-08-02 MED FILL — Levothyroxine Sodium Tab 50 MCG: ORAL | 30 days supply | Qty: 30 | Fill #0 | Status: AC

## 2020-08-02 MED FILL — Levothyroxine Sodium Tab 25 MCG: ORAL | 30 days supply | Qty: 30 | Fill #2 | Status: AC

## 2020-08-03 ENCOUNTER — Other Ambulatory Visit: Payer: Self-pay

## 2020-09-14 ENCOUNTER — Encounter: Payer: BC Managed Care – PPO | Admitting: Nurse Practitioner

## 2020-09-27 ENCOUNTER — Other Ambulatory Visit: Payer: Self-pay

## 2020-09-27 MED FILL — Levothyroxine Sodium Tab 25 MCG: ORAL | 30 days supply | Qty: 30 | Fill #3 | Status: AC

## 2020-09-28 ENCOUNTER — Other Ambulatory Visit: Payer: Self-pay

## 2020-09-28 DIAGNOSIS — E039 Hypothyroidism, unspecified: Secondary | ICD-10-CM | POA: Diagnosis not present

## 2020-09-28 MED ORDER — LEVOTHYROXINE SODIUM 50 MCG PO TABS
ORAL_TABLET | ORAL | 4 refills | Status: DC
  Filled 2020-09-28: qty 90, 90d supply, fill #0

## 2020-10-03 DIAGNOSIS — E039 Hypothyroidism, unspecified: Secondary | ICD-10-CM | POA: Diagnosis not present

## 2020-10-12 ENCOUNTER — Other Ambulatory Visit: Payer: Self-pay

## 2020-11-14 DIAGNOSIS — L218 Other seborrheic dermatitis: Secondary | ICD-10-CM | POA: Diagnosis not present

## 2020-11-18 ENCOUNTER — Ambulatory Visit (INDEPENDENT_AMBULATORY_CARE_PROVIDER_SITE_OTHER): Payer: BC Managed Care – PPO | Admitting: Gastroenterology

## 2020-11-18 ENCOUNTER — Encounter: Payer: Self-pay | Admitting: Gastroenterology

## 2020-11-18 VITALS — BP 110/80 | HR 61 | Ht 68.0 in | Wt 194.0 lb

## 2020-11-18 DIAGNOSIS — K5909 Other constipation: Secondary | ICD-10-CM

## 2020-11-18 DIAGNOSIS — R110 Nausea: Secondary | ICD-10-CM

## 2020-11-18 NOTE — Patient Instructions (Signed)
If you are age 57 or younger, your body mass index should be between 19-25. Your Body mass index is 29.5 kg/m. If this is out of the aformentioned range listed, please consider follow up with your Primary Care Provider.  __________________________________________________________  The [facility] providers would like to encourage you to use [facility] to communicate with providers for non-urgent requests or questions.  Due to long hold times on the telephone, sending your provider a message by [facility] may be a faster and more efficient way to get a response.  Please allow 48 business hours for a response.  Please remember that this is for non-urgent requests.   START Miralax 1 capful daily in 8 ounces of water or juice daily.  Call or message the office with an update on how you are in 3 weeks.  Thank you for entrusting me with your care and choosing [facility].  Alonza Bogus, PA-C

## 2020-11-18 NOTE — Progress Notes (Signed)
11/18/2020 Madison Walker [id number] Jun 09, 1965   HISTORY OF PRESENT ILLNESS: This is a 55 year old female with limited past medical history.  She is a patient Dr. Doyne Keel known to him only for colonoscopy in January of this year at which time she was found to have only internal hemorrhoids.  She presents here today with complaints of nausea.  She says that the nausea only occurs when there is something tight pressing on the area just above her umbilicus.  If she has on tight pants that are pressing in that area or she if she is leaning over a counter or something presses on that area.  If she moves or adjusts her clothing to any other position then the nausea resolves.  She denies any vomiting.  She denies of any abdominal pain per se.  She denies any overt heartburn/reflux.  She does admit to constipation saying that she only has 1 bowel movement every 2 to 3 days and when she does move her bowels she has a lot of straining.  She says that this has been going on for about 1 to 2 years off and on.   Past Medical History:  Diagnosis Date   Heart murmur    Varicose veins of bilateral lower extremities with other complications    Past Surgical History:  Procedure Laterality Date   LAPAROSCOPIC UNILATERAL SALPINGO OOPHERECTOMY Left 05/31/2015   Procedure: LAPAROSCOPIC UNILATERAL SALPINGO OOPHORECTOMY;  Surgeon: Osborne Oman, MD;  Location: [facility];  Service: Gynecology;  Laterality: Left;   TUBAL LIGATION     UNILATERAL SALPINGECTOMY Right 05/31/2015   Procedure: UNILATERAL SALPINGECTOMY;  Surgeon: Osborne Oman, MD;  Location: [facility];  Service: Gynecology;  Laterality: Right;    reports that she has never smoked. She has never used smokeless tobacco. She reports that she does not drink alcohol and does not use drugs. family history includes COPD in her father and mother; Coronary artery disease in her brother; Diabetes in her brother, father, and another family member; Hernia in her  mother. Allergies  Allergen Reactions   Iodine Itching, Rash and Other (See Comments)    Caused rash/hives after a surgery      Outpatient Encounter Medications as of 11/18/2020  Medication Sig   aspirin EC 81 MG tablet Take 81 mg by mouth daily. Swallow whole.   estradiol (ESTRACE) 0.1 MG/GM vaginal cream APPLY PEAS SIZE AMOUNT TO AFFECTED AREA AS DIRECTED   Olopatadine HCl 0.2 % SOLN Apply 1 drop to eye daily.   [DISCONTINUED] amoxicillin-clavulanate (AUGMENTIN) 875-125 MG tablet Take 1 tablet by mouth every 12 (twelve) hours.   [DISCONTINUED] levothyroxine (SYNTHROID) 25 MCG tablet TAKE 1 TABLET BY MOUTH IN THE MORNING ON AN EMPTY STOMACH ONCE A DAY   [DISCONTINUED] levothyroxine (SYNTHROID) 50 MCG tablet TAKE 1 TABLET BY MOUTH IN THE MORNING ON AN EMPTY STOMACH   [DISCONTINUED] levothyroxine (SYNTHROID) 50 MCG tablet Take 1 tablet by mouth in the morning on an empty stomach once a day.   No facility-administered encounter medications on file as of 11/18/2020.     REVIEW OF SYSTEMS  : All other systems reviewed and negative except where noted in the History of Present Illness.   PHYSICAL EXAM: BP 110/80   Pulse 61   Ht 5\' 8"  (1.727 m)   Wt 194 lb (88 kg)   LMP 09/06/2014 (Approximate)   BMI 29.50 kg/m  General: Well developed white female in no acute distress Head: Normocephalic and atraumatic Eyes:  Sclerae anicteric, conjunctiva pink. Ears: Normal auditory acuity Lungs: Clear throughout to auscultation; no W/R/R. Heart: Regular rate and rhythm; no M/R/G. Abdomen: Soft, non-distended.  BS present.  Non-tender. Musculoskeletal: Symmetrical with no gross deformities  Skin: No lesions on visible extremities Extremities: No edema  Neurological: Alert oriented x 4, grossly non-focal Psychological:  Alert and cooperative. Normal mood and affect  ASSESSMENT AND PLAN: *Nausea: Reports that nausea is only present when she has some type of pressure on her abdomen near her  umbilicus.  Such as if she has tight pants pressing on that area or she is leaning over a counter or something is pressing in that area.  She denies any vomiting or abdominal pain per se.  She has had no weight loss, in fact has gained weight.  I am not quite sure what this represents.  I am not convinced that it is anything bad going on.  She does have underlying constipation so question if it could be related to that.  We will treat her underlying constipation as below.  She denies any real overt heartburn or reflux, but could consider trial of a PPI as well.  Consider CT scan or EGD if not improvement but with no red flag symptoms will try to treat as discussed for now. *Constipation: I have asked her to begin taking MiraLAX 1 capful mixed in 8 ounces of liquid daily.  **She will call us with an update with her symptoms in 3 to 4 weeks.  We will see how she is doing with the MiraLAX and could consider addition of PPI as well.   CC:  Vevelyn Francois, NP

## 2020-11-19 NOTE — Progress Notes (Signed)
Agree with assessment and plan as outlined.  

## 2020-12-06 ENCOUNTER — Encounter: Payer: Self-pay | Admitting: *Deleted

## 2020-12-07 ENCOUNTER — Encounter: Payer: Self-pay | Admitting: Neurology

## 2020-12-07 ENCOUNTER — Ambulatory Visit: Payer: BC Managed Care – PPO | Admitting: Neurology

## 2020-12-07 VITALS — BP 147/80 | HR 66 | Ht 68.0 in | Wt 192.8 lb

## 2020-12-07 DIAGNOSIS — Z1389 Encounter for screening for other disorder: Secondary | ICD-10-CM

## 2020-12-07 DIAGNOSIS — L218 Other seborrheic dermatitis: Secondary | ICD-10-CM | POA: Diagnosis not present

## 2020-12-07 DIAGNOSIS — E538 Deficiency of other specified B group vitamins: Secondary | ICD-10-CM | POA: Diagnosis not present

## 2020-12-07 DIAGNOSIS — L308 Other specified dermatitis: Secondary | ICD-10-CM | POA: Diagnosis not present

## 2020-12-07 DIAGNOSIS — M069 Rheumatoid arthritis, unspecified: Secondary | ICD-10-CM | POA: Diagnosis not present

## 2020-12-07 DIAGNOSIS — R7309 Other abnormal glucose: Secondary | ICD-10-CM | POA: Diagnosis not present

## 2020-12-07 DIAGNOSIS — R209 Unspecified disturbances of skin sensation: Secondary | ICD-10-CM | POA: Diagnosis not present

## 2020-12-07 DIAGNOSIS — G6289 Other specified polyneuropathies: Secondary | ICD-10-CM

## 2020-12-07 DIAGNOSIS — E531 Pyridoxine deficiency: Secondary | ICD-10-CM | POA: Diagnosis not present

## 2020-12-07 DIAGNOSIS — E5111 Dry beriberi: Secondary | ICD-10-CM

## 2020-12-07 NOTE — Patient Instructions (Addendum)
- Blood testing for many causes of nerve damage (peripheral neuropathy see below). Raynaud's? - Recommend having your doctor check blood flow in the lower legs (AB Index) if she feels it is warranted - Follow up 4-6 months and if worsening order emg/ncs    Raynaud's Phenomenon Raynaud's phenomenon is a condition that affects the blood vessels (arteries) that carry blood to the fingers and toes. The arteries that supply blood to the ears, lips, nipples, or the tip of the nose might also be affected. Raynaud's phenomenon causes the arteries to become narrow temporarily (spasm). As a result, the flow of blood to the affected areas is temporarily decreased. This usually occurs in response to cold temperatures or stress. During an attack, the skin in the affected areas turns white, then blue, and finally red. A person may also feel tingling or numbness in those areas. Attacks usually last for only a brief period, and then the blood flow to the area returns to normal. In most cases, Raynaud's phenomenon does not cause serious health problems. What are the causes? In many cases, the cause of this condition is not known. The condition may occur on its own (primary Raynaud's phenomenon) or may be associated with other diseases or factors (secondary Raynaud's phenomenon). Possible causes may include: Diseases or medical conditions that damage the arteries. Injuries and repetitive actions that hurt the hands or feet. Being exposed to certain chemicals. Taking medicines that narrow the arteries. Other medical conditions, such as lupus, scleroderma, rheumatoid arthritis, thyroid problems, blood disorders, Sjogren syndrome, or atherosclerosis. What increases the risk? The following factors may make you more likely to develop this condition: Being 14-24 years old. Being female. Having a family history of Raynaud's phenomenon. Living in a cold climate. Smoking. What are the signs or symptoms? Symptoms of  this condition usually occur when you are exposed to cold temperatures or when you have emotional stress. The symptoms may last for a few minutes or up to several hours. They usually affect your fingers but may also affect your toes, nipples, lips, ears, or the tip of your nose. Symptoms may include: Changes in skin color. The skin in the affected areas will turn pale or white. The skin may then change from white to bluish to red as normal blood flow returns to the area. Numbness, tingling, or pain in the affected areas. In severe cases, symptoms may include: Skin sores. Tissues decaying and dying (gangrene). How is this diagnosed? This condition may be diagnosed based on: Your symptoms and medical history. A physical exam. During the exam, you may be asked to put your hands in cold water to check for a reaction to cold temperature. Tests, such as: Blood tests to check for other diseases or conditions. A test to check the movement of blood through your arteries and veins (vascular ultrasound). A test in which the skin at the base of your fingernail is examined under a microscope (nailfold capillaroscopy). How is this treated? During an episode, you can take actions to help symptoms go away faster. Options include moving your arms around in a windmill pattern, warming your fingers under warm water, or placing your fingers in a warm body fold, such as your armpit. Long-term treatment for this condition often involves making lifestyle changes and taking steps to control your exposure to cold temperature. For more severe cases, medicine (calcium channel blockers) may be used to improve blood circulation. Follow these instructions at home: Avoiding cold temperatures Take these steps to avoid exposure  to cold: If possible, stay indoors during cold weather. When you go outside during cold weather, dress in layers and wear mittens, a hat, a scarf, and warm footwear. Wear mittens or gloves when handling  ice or frozen food. Use holders for glasses or cans containing cold drinks. Let warm water run for a while before taking a shower or bath. Warm up the car before driving in cold weather. Lifestyle If possible, avoid stressful and emotional situations. Try to find ways to manage your stress, such as: Exercise. Yoga. Meditation. Biofeedback. Do not use any products that contain nicotine or tobacco. These products include cigarettes, chewing tobacco, and vaping devices, such as e-cigarettes. If you need help quitting, ask your health care provider. Avoid secondhand smoke. Limit your use of caffeine. Switch to decaffeinated coffee, tea, and soda. Avoid chocolate. Avoid vibrating tools and machinery. General instructions Protect your hands and feet from injuries, cuts, or bruises. Avoid wearing tight rings or wristbands. Wear loose fitting socks and comfortable, roomy shoes. Take over-the-counter and prescription medicines only as told by your health care provider. Where to find support Raynaud's Association: www.raynauds.org Where to find more information [employer] of Arthritis and Musculoskeletal and Skin Diseases: www.niams.SouthExposed.es Contact a health care provider if: Your discomfort becomes worse despite lifestyle changes. You develop sores on your fingers or toes that do not heal. You have breaks in the skin on your fingers or toes. You have a fever. You have pain or swelling in your joints. You have a rash. Your symptoms occur on only one side of your body. Get help right away if: Your fingers or toes turn black. You have severe pain in the affected areas. These symptoms may represent a serious problem that is an emergency. Do not wait to see if the symptoms will go away. Get medical help right away. Call your local emergency services (911 in the [place] Do not drive yourself to the hospital. Summary Raynaud's phenomenon is a condition that affects the arteries that carry  blood to the fingers, toes, ears, lips, nipples, or the tip of the nose. In many cases, the cause of this condition is not known. Symptoms of this condition include changes in skin color along with numbness and tingling in the affected area. Treatment for this condition includes lifestyle changes and reducing exposure to cold temperatures. Medicines may be used for severe cases of the condition. Contact your health care provider if your condition worsens despite treatment. This information is not intended to replace advice given to you by your health care provider. Make sure you discuss any questions you have with your health care provider. Document Revised: 04/05/2020 Document Reviewed: 04/05/2020 Elsevier Patient Education  2022 [place].  Peripheral Neuropathy Peripheral neuropathy is a type of nerve damage. It affects nerves that carry signals between the spinal cord and the arms, legs, and the rest of the body (peripheral nerves). It does not affect nerves in the spinal cord or brain. In peripheral neuropathy, one nerve or a group of nerves may be damaged. Peripheral neuropathy is a broad category that includes many specific nerve disorders, like diabetic neuropathy, hereditary neuropathy, and carpal tunnel syndrome. What are the causes? This condition may be caused by: Diabetes. This is the most common cause of peripheral neuropathy. Nerve injury. Pressure or stress on a nerve that lasts a long time. Lack (deficiency) of B vitamins. This can result from alcoholism, poor diet, or a restricted diet. Infections. Autoimmune diseases, such as rheumatoid arthritis and systemic  lupus erythematosus. Nerve diseases that are passed from parent to child (inherited). Some medicines, such as cancer medicines (chemotherapy). Poisonous (toxic) substances, such as lead and mercury. Too little blood flowing to the legs. Kidney disease. Thyroid disease. In some cases, the cause of this condition is  not known. What are the signs or symptoms? Symptoms of this condition depend on which of your nerves is damaged. Common symptoms include: Loss of feeling (numbness) in the feet, hands, or both. Tingling in the feet, hands, or both. Burning pain. Very sensitive skin. Weakness. Not being able to move a part of the body (paralysis). Muscle twitching. Clumsiness or poor coordination. Loss of balance. Not being able to control your bladder. Feeling dizzy. Sexual problems. How is this diagnosed? Diagnosing and finding the cause of peripheral neuropathy can be difficult. Your health care provider will take your medical history and do a physical exam. A neurological exam will also be done. This involves checking things that are affected by your brain, spinal cord, and nerves (nervous system). For example, your health care provider will check your reflexes, how you move, and what you can feel. You may have other tests, such as: Blood tests. Electromyogram (EMG) and nerve conduction tests. These tests check nerve function and how well the nerves are controlling the muscles. Imaging tests, such as CT scans or MRI to rule out other causes of your symptoms. Removing a small piece of nerve to be examined in a lab (nerve biopsy). Removing and examining a small amount of the fluid that surrounds the brain and spinal cord (lumbar puncture). How is this treated? Treatment for this condition may involve: Treating the underlying cause of the neuropathy, such as diabetes, kidney disease, or vitamin deficiencies. Stopping medicines that can cause neuropathy, such as chemotherapy. Medicine to help relieve pain. Medicines may include: Prescription or over-the-counter pain medicine. Antiseizure medicine. Antidepressants. Pain-relieving patches that are applied to painful areas of skin. Surgery to relieve pressure on a nerve or to destroy a nerve that is causing pain. Physical therapy to help improve movement  and balance. Devices to help you move around (assistive devices). Follow these instructions at home: Medicines Take over-the-counter and prescription medicines only as told by your health care provider. Do not take any other medicines without first asking your health care provider. Do not drive or use heavy machinery while taking prescription pain medicine. Lifestyle  Do not use any products that contain nicotine or tobacco, such as cigarettes and e-cigarettes. Smoking keeps blood from reaching damaged nerves. If you need help quitting, ask your health care provider. Avoid or limit alcohol. Too much alcohol can cause a vitamin B deficiency, and vitamin B is needed for healthy nerves. Eat a healthy diet. This includes: Eating foods that are high in fiber, such as fresh fruits and vegetables, whole grains, and beans. Limiting foods that are high in fat and processed sugars, such as fried or sweet foods. General instructions  If you have diabetes, work closely with your health care provider to keep your blood sugar under control. If you have numbness in your feet: Check every day for signs of injury or infection. Watch for redness, warmth, and swelling. Wear padded socks and comfortable shoes. These help protect your feet. Develop a good support system. Living with peripheral neuropathy can be stressful. Consider talking with a mental health specialist or joining a support group. Use assistive devices and attend physical therapy as told by your health care provider. This may include using a  walker or a cane. Keep all follow-up visits as told by your health care provider. This is important. Contact a health care provider if: You have new signs or symptoms of peripheral neuropathy. You are struggling emotionally from dealing with peripheral neuropathy. Your pain is not well-controlled. Get help right away if: You have an injury or infection that is not healing normally. You develop new  weakness in an arm or leg. You have fallen or do so frequently. Summary Peripheral neuropathy is when the nerves in the arms, or legs are damaged, resulting in numbness, weakness, or pain. There are many causes of peripheral neuropathy, including diabetes, pinched nerves, vitamin deficiencies, autoimmune disease, and hereditary conditions. Diagnosing and finding the cause of peripheral neuropathy can be difficult. Your health care provider will take your medical history, do a physical exam, and do tests, including blood tests and nerve function tests. Treatment involves treating the underlying cause of the neuropathy and taking medicines to help control pain. Physical therapy and assistive devices may also help. This information is not intended to replace advice given to you by your health care provider. Make sure you discuss any questions you have with your health care provider. Document Revised: 11/10/2019 Document Reviewed: 11/10/2019 Elsevier Patient Education  2022 [place] is a test to check how well your muscles and nerves are working. This procedure includes the combined use of electromyogram (EMG) and nerve conduction study (NCS). EMG is used to look for muscular disorders. NCS, which is also called electroneurogram, measures how well your nerves are controlling your muscles. The procedures are usually done together to check if your muscles and nerves are healthy. If the results of the tests are abnormal, this may indicate disease or injury, such as a neuromuscular disease or peripheral nerve damage. Tell a health care provider about: Any allergies you have. All medicines you are taking, including vitamins, herbs, eye drops, creams, and over-the-counter medicines. Any problems you or family members have had with anesthetic medicines. Any blood disorders you have. Any surgeries you have had. Any medical conditions you have. If you have a  pacemaker. Whether you are pregnant or may be pregnant. What are the risks? Generally, this is a safe procedure. However, problems may occur, including: Infection where the electrodes were inserted. Bleeding. What happens before the procedure? Medicines Ask your health care provider about: Changing or stopping your regular medicines. This is especially important if you are taking diabetes medicines or blood thinners. Taking medicines such as aspirin and ibuprofen. These medicines can thin your blood. Do not take these medicines unless your health care provider tells you to take them. Taking over-the-counter medicines, vitamins, herbs, and supplements. General instructions Your health care provider may ask you to avoid: Beverages that have caffeine, such as coffee and tea. Any products that contain nicotine or tobacco. These products include cigarettes, e-cigarettes, and chewing tobacco. If you need help quitting, ask your health care provider. Do not use lotions or creams on the same day that you will be having the procedure. What happens during the procedure? For EMG  Your health care provider will ask you to stay in a position so that he or she can access the muscle that will be studied. You may be standing, sitting, or lying down. You may be given a medicine that numbs the area (local anesthetic). A very thin needle that has an electrode will be inserted into your muscle. Another small electrode will be placed on your skin near  the muscle. Your health care provider will ask you to continue to remain still. The electrodes will send a signal that tells about the electrical activity of your muscles. You may see this on a monitor or hear it in the room. After your muscles have been studied at rest, your health care provider will ask you to contract or flex your muscles. The electrodes will send a signal that tells about the electrical activity of your muscles. Your health care provider will  remove the electrodes and the electrode needles when the procedure is finished. The procedure may vary among health care providers and hospitals. For NCS  An electrode that records your nerve activity (recording electrode) will be placed on your skin by the muscle that is being studied. An electrode that is used as a reference (reference electrode) will be placed near the recording electrode. A paste or gel will be applied to your skin between the recording electrode and the reference electrode. Your nerve will be stimulated with a mild shock. Your health care provider will measure how much time it takes for your muscle to react. Your health care provider will remove the electrodes and the gel when the procedure is finished. The procedure may vary among health care providers and hospitals. What happens after the procedure? It is up to you to get the results of your procedure. Ask your health care provider, or the department that is doing the procedure, when your results will be ready. Your health care provider may: Give you medicines for any pain. Monitor the insertion sites to make sure that bleeding stops. Summary Electromyoneurogram is a test to check how well your muscles and nerves are working. If the results of the tests are abnormal, this may indicate disease or injury. This is a safe procedure. However, problems may occur, such as bleeding and infection. Your health care provider will do two tests to complete this procedure. One checks your muscles (EMG) and another checks your nerves (NCS). It is up to you to get the results of your procedure. Ask your health care provider, or the department that is doing the procedure, when your results will be ready. This information is not intended to replace advice given to you by your health care provider. Make sure you discuss any questions you have with your health care provider. Document Revised: 10/15/2017 Document Reviewed:  09/27/2017 Elsevier Patient Education  [place].

## 2020-12-07 NOTE — Progress Notes (Signed)
[facility]    Provider:  Dr Jaynee Eagles Requesting Provider: Jacelyn Pi, MD Primary Care Provider:  Vevelyn Francois, NP  CC:  feet feel [place] Brick  HPI:  Madison Walker is a 55 y.o. female here as requested by Jacelyn Pi, MD for neuropathy. PMHx hypothyroidism. I reviewed requesting provider's notes, only stated history is "numbness and tingling in the toes" otherwise no information in paperwork sent on chief complaint.   She has cold feet and needle prick feeling in her big toe. She went to vein and vascular for her varicose veins and circulation looked good. They actually feel cold to the touch as well. They feel cold right now even with warm socks. No rashes or redness, she has had a little swelling in her ankles. She is seeing endocrinology to help with hypothyroidism and she has not had mediction since august because side effects. Fingers are fine. Toes do not turn colors. No problems in feet summertime just when cold. When it is cold outside like even in the 50s her legs get cold. Actually feels cold to the touch as well, comes and goes. She feels some weakness. She has vericose veins. Not worsening. Off and on. No low back pain associated. Stable, not progressive. No other focal neurologic deficits, associated symptoms, inciting events or modifiable factors.  Reviewed notes, labs and imaging from outside physicians, which showed:  TSH 3.51  DG lumbar spine 09/2013: reviewed images: FINDINGS:  Vertebral body height and alignment are maintained. Mild loss of  disc space height at L4-5 and L5-S1 is noted. There is also facet  degenerative change at these levels.   IMPRESSION:  No acute finding.   Mild appearing lower lumbar spondylosis, unchanged.   Review of Systems: Patient complains of symptoms per HPI as well as the following symptoms thyroid disease. Pertinent negatives and positives per HPI. All others negative.   Social History   Socioeconomic History    Marital status: Divorced    Spouse name: Not on file   Number of children: Not on file   Years of education: Not on file   Highest education level: Not on file  Occupational History   Not on file  Tobacco Use   Smoking status: Never   Smokeless tobacco: Never  Vaping Use   Vaping Use: Never used  Substance and Sexual Activity   Alcohol use: No   Drug use: No   Sexual activity: Not on file  Other Topics Concern   Not on file  Social History Narrative   Not on file   Social Determinants of Health   Financial Resource Strain: Not on file  Food Insecurity: Not on file  Transportation Needs: Not on file  Physical Activity: Not on file  Stress: Not on file  Social Connections: Not on file  Intimate Partner Violence: Not on file    Family History  Problem Relation Age of Onset   Hypertension Mother    COPD Mother    Hernia Mother    Diabetes Father    COPD Father    Hypertension Sister    Hypertension Brother    Coronary artery disease Brother        MI in his 68's   Diabetes Brother    Diabetes Other    Colon cancer Neg Hx    Colon polyps Neg Hx    Esophageal cancer Neg Hx    Rectal cancer Neg Hx    Stomach cancer Neg Hx    Neuropathy Neg  Hx     Past Medical History:  Diagnosis Date   Heart murmur    Hypothyroidism    Varicose veins of bilateral lower extremities with other complications     Patient Active Problem List   Diagnosis Date Noted   Other constipation 11/18/2020   Nausea without vomiting 11/18/2020   Right bundle branch block 09/30/2018   Family history of heart disease 06/19/2018   Atypical chest pain 11/22/2017   Dermoid cyst of ovary (8 cm) 04/07/2015    Past Surgical History:  Procedure Laterality Date   LAPAROSCOPIC UNILATERAL SALPINGO OOPHERECTOMY Left 05/31/2015   Procedure: LAPAROSCOPIC UNILATERAL SALPINGO OOPHORECTOMY;  Surgeon: Osborne Oman, MD;  Location: [facility];  Service: Gynecology;  Laterality: Left;   TUBAL LIGATION      UNILATERAL SALPINGECTOMY Right 05/31/2015   Procedure: UNILATERAL SALPINGECTOMY;  Surgeon: Osborne Oman, MD;  Location: [facility];  Service: Gynecology;  Laterality: Right;    Current Outpatient Medications  Medication Sig Dispense Refill   aspirin EC 81 MG tablet Take 81 mg by mouth daily. Swallow whole.     estradiol (ESTRACE) 0.1 MG/GM vaginal cream APPLY PEAS SIZE AMOUNT TO AFFECTED AREA AS DIRECTED 42.5 g 2   Olopatadine HCl 0.2 % SOLN Apply 1 drop to eye daily. 2.5 mL 0   No current facility-administered medications for this visit.    Allergies as of 12/07/2020 - Review Complete 12/07/2020  Allergen Reaction Noted   Iodine Itching, Rash, and Other (See Comments) 06/20/2015    Vitals: BP (!) 147/80   Pulse 66   Ht $R'5\' 8"'QP$  (1.727 m)   Wt 192 lb 12.8 oz (87.5 kg)   LMP 09/06/2014 (Approximate)   BMI 29.32 kg/m  Last Weight:  Wt Readings from Last 1 Encounters:  12/07/20 192 lb 12.8 oz (87.5 kg)   Last Height:   Ht Readings from Last 1 Encounters:  12/07/20 $RemoveB'5\' 8"'eNTYwUDt$  (1.727 m)     Physical exam: Exam: Gen: NAD, conversant, well nourised, overwight, well groomed                     CV: RRR, no MRG. No Carotid Bruits. No peripheral edema, warm, nontender Eyes: Conjunctivae clear without exudates or hemorrhage  Neuro: Detailed Neurologic Exam  Speech:    Speech is normal; fluent and spontaneous with normal comprehension.  Cognition:    The patient is oriented to person, place, and time;     recent and remote memory intact;     language fluent;     normal attention, concentration,     fund of knowledge Cranial Nerves:    The pupils are equal, round, and reactive to light. The fundi are flat. Visual fields are full to finger confrontation. Extraocular movements are intact. Trigeminal sensation is intact and the muscles of mastication are normal. The face is symmetric. The palate elevates in the midline. Hearing intact. Voice is normal. Shoulder shrug is normal. The tongue  has normal motion without fasciculations.   Coordination:    Normal   Gait:    normal.   Motor Observation:    No asymmetry, no atrophy, and no involuntary movements noted. Tone:    Normal muscle tone.    Posture:    Posture is normal. normal erect    Strength:    Strength is V/V in the upper and lower limbs.      Sensation: intact to LT. Decreased slightly to temperature dista;;y in the feet, intact to pin prick,  vibration     Reflex Exam:  DTR's:  Trace AJs otherwise  Deep tendon reflexes in the upper and lower extremities are normal bilaterally.   Toes:    The toes are downgoing bilaterally.   Clonus:    Clonus is absent.    Assessment/Plan:  A very nice patient with cold feet. Only happens in the winter and not in the summer. Off and on. When she senses her feet are cold they actually feel cold to the touch. It is "on and off" and doesn't happen in the summer. Doesn't sound like peripheral neuropathy possible arterial problem or maybe raynaud's in the feet? We will test her for serum causes of neuropathy and see if we find anything.   - Recommend having your doctor check blood flow in the lower legs (AB Index) if pcp feels it is warranted. She is not a smoker but was exposed to her mother's smoking for many years, mother had "circulation issues" maybe arterial disease. I was able to palpate dorsalis pedis pulse however. - Follow up 4-6 months and if worsening order emg/ncs  Orders Placed This Encounter  Procedures   ANA, IFA (with reflex)   Rheumatoid factor   Sjogren's syndrome antibods(ssa + ssb)   Hemoglobin A1c   Methylmalonic acid, serum   Vitamin B1   Vitamin B6   Heavy metals, blood   Multiple Myeloma Panel (SPEP&IFE w/QIG)   CBC with Differential/Platelets   Comprehensive metabolic panel   J28 and Folate Panel   No orders of the defined types were placed in this encounter.   Cc: Jacelyn Pi, MD,  Vevelyn Francois, NP  [place],  [facility] [address] [place], [place] [address]  Phone [phone] Fax [phone]

## 2020-12-08 ENCOUNTER — Telehealth: Payer: Self-pay | Admitting: Neurology

## 2020-12-08 NOTE — Telephone Encounter (Signed)
Pt states that some of her lab work results have been put on mychart and she is wanting to know if the RN can call her back to discuss.

## 2020-12-08 NOTE — Telephone Encounter (Signed)
Of note, there are results that are not finalized and some that haven't come back at all. Dr Jaynee Eagles also has not had opportunity to review the ones that have resulted yet.

## 2020-12-12 NOTE — Telephone Encounter (Signed)
Blood work looks fine, nothing concerning seen on blood work. No cause for neuropathy found. Have a great Sunday. Dr. Jaynee Eagles  Written by Melvenia Beam, MD on 12/11/2020 12:36 PM EDT

## 2020-12-16 LAB — COMPREHENSIVE METABOLIC PANEL
ALT: 27 IU/L (ref 0–32)
AST: 25 IU/L (ref 0–40)
Albumin/Globulin Ratio: 2 (ref 1.2–2.2)
Albumin: 4.7 g/dL (ref 3.8–4.9)
Alkaline Phosphatase: 122 IU/L — ABNORMAL HIGH (ref 44–121)
BUN/Creatinine Ratio: 30 — ABNORMAL HIGH (ref 9–23)
BUN: 19 mg/dL (ref 6–24)
Bilirubin Total: 0.3 mg/dL (ref 0.0–1.2)
CO2: 25 mmol/L (ref 20–29)
Calcium: 10 mg/dL (ref 8.7–10.2)
Chloride: 105 mmol/L (ref 96–106)
Creatinine, Ser: 0.63 mg/dL (ref 0.57–1.00)
Globulin, Total: 2.4 g/dL (ref 1.5–4.5)
Glucose: 87 mg/dL (ref 70–99)
Potassium: 4.8 mmol/L (ref 3.5–5.2)
Sodium: 144 mmol/L (ref 134–144)
Total Protein: 7.1 g/dL (ref 6.0–8.5)
eGFR: 103 mL/min/{1.73_m2} (ref >59)

## 2020-12-16 LAB — CBC WITH DIFFERENTIAL/PLATELET
Basophils Absolute: 0 10*3/uL (ref 0.0–0.2)
Basos: 1 %
EOS (ABSOLUTE): 0.2 10*3/uL (ref 0.0–0.4)
Eos: 4 %
Hematocrit: 38.8 % (ref 34.0–46.6)
Hemoglobin: 12.9 g/dL (ref 11.1–15.9)
Immature Grans (Abs): 0 10*3/uL (ref 0.0–0.1)
Immature Granulocytes: 0 %
Lymphocytes Absolute: 2.1 10*3/uL (ref 0.7–3.1)
Lymphs: 35 %
MCH: 32.3 pg (ref 26.6–33.0)
MCHC: 33.2 g/dL (ref 31.5–35.7)
MCV: 97 fL (ref 79–97)
Monocytes Absolute: 0.7 10*3/uL (ref 0.1–0.9)
Monocytes: 12 %
Neutrophils Absolute: 2.8 10*3/uL (ref 1.4–7.0)
Neutrophils: 48 %
Platelets: 239 10*3/uL (ref 150–450)
RBC: 3.99 x10E6/uL (ref 3.77–5.28)
RDW: 11.8 % (ref 11.7–15.4)
WBC: 5.9 10*3/uL (ref 3.4–10.8)

## 2020-12-16 LAB — B12 AND FOLATE PANEL
Folate: 16.3 ng/mL (ref >3.0)
Vitamin B-12: 636 pg/mL (ref 232–1245)

## 2020-12-16 LAB — MULTIPLE MYELOMA PANEL, SERUM
Albumin SerPl Elph-Mcnc: 4.5 g/dL — ABNORMAL HIGH (ref 2.9–4.4)
Albumin/Glob SerPl: 1.8 — ABNORMAL HIGH (ref 0.7–1.7)
Alpha 1: 0.2 g/dL (ref 0.0–0.4)
Alpha2 Glob SerPl Elph-Mcnc: 0.5 g/dL (ref 0.4–1.0)
B-Globulin SerPl Elph-Mcnc: 0.9 g/dL (ref 0.7–1.3)
Gamma Glob SerPl Elph-Mcnc: 1 g/dL (ref 0.4–1.8)
Globulin, Total: 2.6 g/dL (ref 2.2–3.9)
IgA/Immunoglobulin A, Serum: 182 mg/dL (ref 87–352)
IgG (Immunoglobin G), Serum: 1110 mg/dL (ref 586–1602)
IgM (Immunoglobulin M), Srm: 87 mg/dL (ref 26–217)

## 2020-12-16 LAB — HEAVY METALS, BLOOD
Arsenic: 2 ug/L (ref 0–9)
Lead, Blood: <1 ug/dL (ref 0.0–3.4)
Mercury: 1.5 ug/L (ref 0.0–14.9)

## 2020-12-16 LAB — METHYLMALONIC ACID, SERUM: Methylmalonic Acid: 140 nmol/L (ref 0–378)

## 2020-12-16 LAB — ANTINUCLEAR ANTIBODIES, IFA: ANA Titer 1: NEGATIVE

## 2020-12-16 LAB — HEMOGLOBIN A1C
Est. average glucose Bld gHb Est-mCnc: 105 mg/dL
Hgb A1c MFr Bld: 5.3 % (ref 4.8–5.6)

## 2020-12-16 LAB — SJOGREN'S SYNDROME ANTIBODS(SSA + SSB)
ENA SSA (RO) Ab: <0.2 AI (ref 0.0–0.9)
ENA SSB (LA) Ab: <0.2 AI (ref 0.0–0.9)

## 2020-12-16 LAB — VITAMIN B1: Thiamine: 154.2 nmol/L (ref 66.5–200.0)

## 2020-12-16 LAB — VITAMIN B6: Vitamin B6: 46.6 ug/L (ref 3.4–65.2)

## 2020-12-16 LAB — RHEUMATOID FACTOR: Rheumatoid fact SerPl-aCnc: <10 IU/mL (ref <14.0)

## 2020-12-21 DIAGNOSIS — E063 Autoimmune thyroiditis: Secondary | ICD-10-CM | POA: Diagnosis not present

## 2020-12-21 DIAGNOSIS — E039 Hypothyroidism, unspecified: Secondary | ICD-10-CM | POA: Diagnosis not present

## 2020-12-21 DIAGNOSIS — E038 Other specified hypothyroidism: Secondary | ICD-10-CM | POA: Insufficient documentation

## 2020-12-21 DIAGNOSIS — R002 Palpitations: Secondary | ICD-10-CM | POA: Insufficient documentation

## 2020-12-22 ENCOUNTER — Other Ambulatory Visit: Payer: Self-pay

## 2020-12-22 ENCOUNTER — Ambulatory Visit: Payer: BC Managed Care – PPO | Admitting: Podiatry

## 2020-12-22 DIAGNOSIS — E039 Hypothyroidism, unspecified: Secondary | ICD-10-CM | POA: Insufficient documentation

## 2020-12-22 DIAGNOSIS — E063 Autoimmune thyroiditis: Secondary | ICD-10-CM | POA: Insufficient documentation

## 2020-12-22 DIAGNOSIS — R209 Unspecified disturbances of skin sensation: Secondary | ICD-10-CM | POA: Diagnosis not present

## 2020-12-22 MED ORDER — LEVOTHYROXINE SODIUM 25 MCG PO TABS
ORAL_TABLET | ORAL | 2 refills | Status: DC
  Filled 2020-12-22 – 2021-03-20 (×3): qty 45, 90d supply, fill #0
  Filled 2021-06-15: qty 45, 90d supply, fill #1

## 2020-12-23 ENCOUNTER — Other Ambulatory Visit: Payer: Self-pay

## 2020-12-25 NOTE — Progress Notes (Signed)
Subjective:   Patient ID: Madison Walker, female   DOB: 55 y.o.   MRN: [id number]   HPI 55 year old female presents the office today for concerns of coldness to her feet.  She is concerned about a circulation problem.  She is not diabetic.  She states the toes, feet do not turn colors.  She states that at times it feels like a needle sensation.  She states that she did see a neurologist and they did not think it was neuropathy.  No other concerns today.   Review of Systems  All other systems reviewed and are negative.  Past Medical History:  Diagnosis Date   Heart murmur    Hypothyroidism    Varicose veins of bilateral lower extremities with other complications     Past Surgical History:  Procedure Laterality Date   LAPAROSCOPIC UNILATERAL SALPINGO OOPHERECTOMY Left 05/31/2015   Procedure: LAPAROSCOPIC UNILATERAL SALPINGO OOPHORECTOMY;  Surgeon: Osborne Oman, MD;  Location: [facility];  Service: Gynecology;  Laterality: Left;   TUBAL LIGATION     UNILATERAL SALPINGECTOMY Right 05/31/2015   Procedure: UNILATERAL SALPINGECTOMY;  Surgeon: Osborne Oman, MD;  Location: [facility];  Service: Gynecology;  Laterality: Right;     Current Outpatient Medications:    fluticasone (CUTIVATE) 0.05 % cream, APPLY TO AFFECTED AREA UP TO TWICE A DAY AS NEEDED, Disp: , Rfl:    levothyroxine (SYNTHROID) 25 MCG tablet, Take by mouth., Disp: , Rfl:    aspirin 81 MG chewable tablet, 1 tablet, Disp: , Rfl:    aspirin EC 81 MG tablet, Take 81 mg by mouth daily. Swallow whole., Disp: , Rfl:    clobetasol (TEMOVATE) 0.05 % external solution, Apply topically., Disp: , Rfl:    Krill Oil 500 MG CAPS, See admin instructions., Disp: , Rfl:    levothyroxine (SYNTHROID) 25 MCG tablet, Take 0.5 tablets (12.5 mcg total) by mouth daily., Disp: 45 tablet, Rfl: 2   Olopatadine HCl 0.2 % SOLN, Apply 1 drop to eye daily., Disp: 2.5 mL, Rfl: 0  Allergies  Allergen Reactions   Iodine Itching, Rash and Other (See Comments)     Caused rash/hives after a surgery          Objective:  Physical Exam  General: AAO x3, NAD  Dermatological: Skin is warm, dry and supple bilateral. There are no open sores, no preulcerative lesions, no rash or signs of infection present.  Vascular: Dorsalis Pedis artery and Posterior Tibial artery pedal pulses are palpable bilateral with immedate capillary fill time.  There is no pain with calf compression, swelling, warmth, erythema.   Neruologic: Sensation slightly decreased with Semmes Weinstein monofilament to the plantar aspect of the feet.  Musculoskeletal: Not able to elicit any area of tenderness.  No gross boney pedal deformities bilateral. No pain, crepitus, or limitation noted with foot and ankle range of motion bilateral. Muscular strength 5/5 in all groups tested bilateral.  Gait: Unassisted, Nonantalgic.       Assessment:   Cold foot sensation, likely circulation issue versus possible small fiber neuropathy     Plan:  -Treatment options discussed including all alternatives, risks, and complications -Etiology of symptoms were discussed -She is previously seen neurology and they did not think it was neuropathy.  I will order arterial studies to include ABI, TBI.  Discussed doing further work-up if this is negative including possible nerve biopsy or further blood work.  Trula Slade DPM

## 2020-12-26 ENCOUNTER — Other Ambulatory Visit: Payer: Self-pay

## 2020-12-26 ENCOUNTER — Ambulatory Visit (HOSPITAL_COMMUNITY)
Admission: RE | Admit: 2020-12-26 | Discharge: 2020-12-26 | Disposition: A | Discharge status: Home / Self Care | Payer: BC Managed Care – PPO | Source: Ambulatory Visit | Attending: Podiatry | Admitting: Podiatry

## 2020-12-26 DIAGNOSIS — R209 Unspecified disturbances of skin sensation: Secondary | ICD-10-CM | POA: Diagnosis not present

## 2021-01-02 ENCOUNTER — Telehealth: Payer: Self-pay | Admitting: *Deleted

## 2021-01-02 NOTE — Telephone Encounter (Signed)
Patient is calling for Vascular and Vein results (12/26/20)and what is the next step moving forward? Please advise.

## 2021-01-03 NOTE — Telephone Encounter (Signed)
I put a kit in Dr Leigh Aurora dictation office. Lattie Haw

## 2021-01-03 NOTE — Telephone Encounter (Signed)
Returned call to patient giving Dr Leigh Aurora test results and further recommendations, verbalized understanding and wants to proceed with an appointment asap for the nerve biopsy.

## 2021-01-03 NOTE — Telephone Encounter (Signed)
Returned the call to patient, no answer, left message to call back for normal test results and recommendations per Dr Jacqualyn Posey.

## 2021-01-19 ENCOUNTER — Other Ambulatory Visit: Payer: Self-pay

## 2021-01-19 ENCOUNTER — Ambulatory Visit (INDEPENDENT_AMBULATORY_CARE_PROVIDER_SITE_OTHER): Payer: BC Managed Care – PPO | Admitting: Podiatry

## 2021-01-19 DIAGNOSIS — G609 Hereditary and idiopathic neuropathy, unspecified: Secondary | ICD-10-CM | POA: Diagnosis not present

## 2021-01-19 DIAGNOSIS — R2990 Unspecified symptoms and signs involving the nervous system: Secondary | ICD-10-CM | POA: Diagnosis not present

## 2021-01-22 NOTE — Progress Notes (Signed)
Subjective: 55 year old female presents the office today for nerve biopsy.  She had arterial studies performed which were normal.  She is still describing needle sensation to her feet. No recent injury or changes.  No other concerns.  Objective: AAO x3, NAD DP/PT pulses palpable bilaterally, CRT less than 3 seconds Overall exam is unchanged.  Sensation slight decreased with Semmes onto monofilament.  Not able to elicit any area of pinpoint tenderness.  There is no edema, erythema.  Flexor, extensor tendons appear to be intact.  MMT 5/5. No pain with calf compression, swelling, warmth, erythema  Assessment: Likely neuropathy  Plan: -All treatment options discussed with the patient including all alternatives, risks, complications.  -Discussed with ep we discussed pros and cons of doing this as well as alternatives, risks, complications.  After discussion she wants to proceed with this and consent was signed.  The skin was prepped with alcohol and 1 cc lidocaine with epinephrine was infiltrated just proximal to the biopsy sites.  Once anesthetized the skin was prepped with alcohol.  I then utilized a punch biopsy to take a biopsy on both the left and right sides approximately 10 cm proximal to the lateral malleolus.  These were sent to the respective containers which are properly labeled.  I irrigated both the biopsy sites and hemostasis achieved.  A small amount of antibiotic ointment was applied followed by dressing.  She tolerated the procedure well and complications.  Post procedure instructions were discussed. -Patient encouraged to call the office with any questions, concerns, change in symptoms.   Trula Slade DPM

## 2021-02-02 ENCOUNTER — Telehealth: Payer: Self-pay | Admitting: Podiatry

## 2021-02-02 ENCOUNTER — Ambulatory Visit: Payer: BC Managed Care – PPO | Admitting: Podiatry

## 2021-02-02 NOTE — Telephone Encounter (Signed)
Patient called and wanted to know if Dr. Jacqualyn Posey has received her Madison Walker results before she comes in today.

## 2021-02-09 ENCOUNTER — Other Ambulatory Visit: Payer: Self-pay

## 2021-02-15 ENCOUNTER — Other Ambulatory Visit: Payer: Self-pay

## 2021-03-07 ENCOUNTER — Other Ambulatory Visit (HOSPITAL_COMMUNITY): Payer: Self-pay

## 2021-03-09 ENCOUNTER — Telehealth: Payer: Self-pay | Admitting: Podiatry

## 2021-03-09 ENCOUNTER — Other Ambulatory Visit: Payer: Self-pay | Admitting: Podiatry

## 2021-03-09 MED ORDER — GABAPENTIN 300 MG PO CAPS
300.0000 mg | ORAL_CAPSULE | Freq: Every day | ORAL | 0 refills | Status: DC
  Filled 2021-03-09: qty 90, 90d supply, fill #0

## 2021-03-09 NOTE — Telephone Encounter (Signed)
Patient called she states the B12 Complex is not helping with the nerve pain in her feet and she would like to try the medication you discussed before to see if it helps.  I offered patient appointment since she missed her last appointment , she declined at this time for an appointment. Please Advise

## 2021-03-10 ENCOUNTER — Other Ambulatory Visit: Payer: Self-pay

## 2021-03-14 ENCOUNTER — Other Ambulatory Visit: Payer: Self-pay

## 2021-03-16 ENCOUNTER — Other Ambulatory Visit: Payer: Self-pay

## 2021-03-17 ENCOUNTER — Telehealth: Payer: Self-pay | Admitting: *Deleted

## 2021-03-17 NOTE — Telephone Encounter (Signed)
Patient is calling because the Gabapentin prescribed has too many bad side effects, would like to try something less serious. Returned call back to patient to get more details of the side effects, no answer, left vmessage.Please advise.

## 2021-03-20 ENCOUNTER — Other Ambulatory Visit: Payer: Self-pay

## 2021-03-20 NOTE — Telephone Encounter (Signed)
Returned the call to patient, no answer, left vmessage to call back for physician's recommendations.

## 2021-03-20 NOTE — Telephone Encounter (Signed)
Patient is calling because she was concerned about Gabapentin(has not started the medication as of yet) and side effects and what it is mainly used to treat besides pain,wanted to speak with the doctor about the medication in detail.Please advise.

## 2021-03-21 ENCOUNTER — Other Ambulatory Visit: Payer: Self-pay

## 2021-04-03 ENCOUNTER — Other Ambulatory Visit: Payer: Self-pay

## 2021-04-03 ENCOUNTER — Ambulatory Visit: Payer: BC Managed Care – PPO | Admitting: Podiatry

## 2021-04-03 DIAGNOSIS — L97921 Non-pressure chronic ulcer of unspecified part of left lower leg limited to breakdown of skin: Secondary | ICD-10-CM | POA: Diagnosis not present

## 2021-04-03 DIAGNOSIS — G609 Hereditary and idiopathic neuropathy, unspecified: Secondary | ICD-10-CM

## 2021-04-03 MED ORDER — MUPIROCIN 2 % EX OINT
1.0000 "application " | TOPICAL_OINTMENT | Freq: Two times a day (BID) | CUTANEOUS | 2 refills | Status: DC
  Filled 2021-04-03: qty 22, 10d supply, fill #0

## 2021-04-03 MED ORDER — DOXYCYCLINE HYCLATE 100 MG PO TABS
100.0000 mg | ORAL_TABLET | Freq: Two times a day (BID) | ORAL | 0 refills | Status: DC
  Filled 2021-04-03: qty 20, 10d supply, fill #0

## 2021-04-06 ENCOUNTER — Other Ambulatory Visit: Payer: Self-pay

## 2021-04-08 NOTE — Progress Notes (Signed)
Subjective: 56 year old female presents the office today for follow-up evaluation after undergoing nerve biopsy.  She states that the procedures on the left sides of the scalp and she is noticing redness to the right has been doing well.  She has not seen any drainage or pus.  She recently started B complex as well as alpha lipoic acid.   Objective: AAO x3, NAD DP/PT pulses palpable bilaterally, CRT less than 3 seconds On the right side the procedure site is healed.  Left side there is a scab present with active bleeding today and there is a small amount of drainage present underneath this.  There is localized redness around the scab without any ascending cellulitis.  There is no fluctuation or crepitation.  Overall her symptoms to her feet are unchanged otherwise.   No pain with calf compression, swelling, warmth, erythema  Assessment: Status post nerve biopsy with delayed healing left procedure site  Plan: -All treatment options discussed with the patient including all alternatives, risks, complications.  -I debrided this Today without any complications and a small amount of clear to bloody drainage noted.  The wound itself is superficial and no probing.  Mild localized erythema.  Given the wound as well as the drainage I have started her on doxycycline.  Also prescribed mupirocin dressing changes daily.  He can wash it with soap and water. -Continue B complex as well as alpha lipoic acid daily.  -Monitor for any clinical signs or symptoms of infection and directed to call the office immediately should any occur or go to the ER.  Return in about 2 weeks (around 04/17/2021).  Trula Slade DPM

## 2021-04-21 ENCOUNTER — Ambulatory Visit: Payer: BC Managed Care – PPO | Admitting: Podiatry

## 2021-04-21 ENCOUNTER — Other Ambulatory Visit: Payer: Self-pay

## 2021-04-21 DIAGNOSIS — L97921 Non-pressure chronic ulcer of unspecified part of left lower leg limited to breakdown of skin: Secondary | ICD-10-CM

## 2021-04-25 NOTE — Progress Notes (Signed)
Subjective: ?56 year old female presents the office today for evaluation after undergoing nerve biopsy.  She states that the wound in the left foot is been doing much better and she has not seen any drainage or pus or any increase in swelling.  She does not any significant pain with it.  Denies any fevers or chills.  No other concerns. ? ?Objective: ?AAO x3, NAD ?DP/PT pulses palpable bilaterally, CRT less than 3 seconds ?Status post nerve biopsy, punch biopsy of the calf.  The right side is healed.  Left side status post superficial area of skin breakdown with granulation tissue.  There is no drainage or pus.  Remove erythema likely more from inflammation as opposed to infection.  No ascending cellulitis.  No fluctuation or crepitation.  There is no malodor. ?No pain with calf compression, swelling, warmth, erythema ? ?Assessment: ?Status post nerve biopsy with healing wound ? ?Plan: ?-All treatment options discussed with the patient including all alternatives, risks, complications.  ?-Overall doing better as far as the procedure site is healing.  Continue with wash with soap and water daily and apply a small amount of antibiotic ointment and a bandage during the day but can leave the area open at nighttime.  Monitor for any signs or symptoms of infection and if not healed the next 2 weeks to let me know or sooner if there is any worsening. ?-Patient encouraged to call the office with any questions, concerns, change in symptoms.  ? ?Trula Slade DPM ? ?

## 2021-05-02 DIAGNOSIS — E038 Other specified hypothyroidism: Secondary | ICD-10-CM | POA: Diagnosis not present

## 2021-05-02 DIAGNOSIS — E063 Autoimmune thyroiditis: Secondary | ICD-10-CM | POA: Diagnosis not present

## 2021-05-05 DIAGNOSIS — Z79890 Hormone replacement therapy (postmenopausal): Secondary | ICD-10-CM | POA: Diagnosis not present

## 2021-05-05 DIAGNOSIS — E038 Other specified hypothyroidism: Secondary | ICD-10-CM | POA: Diagnosis not present

## 2021-05-05 DIAGNOSIS — E063 Autoimmune thyroiditis: Secondary | ICD-10-CM | POA: Diagnosis not present

## 2021-05-11 ENCOUNTER — Encounter: Payer: Self-pay | Admitting: Nurse Practitioner

## 2021-05-11 ENCOUNTER — Ambulatory Visit (INDEPENDENT_AMBULATORY_CARE_PROVIDER_SITE_OTHER): Payer: BC Managed Care – PPO | Admitting: Nurse Practitioner

## 2021-05-11 VITALS — BP 127/65 | HR 64 | Resp 16

## 2021-05-11 DIAGNOSIS — Z0000 Encounter for general adult medical examination without abnormal findings: Secondary | ICD-10-CM

## 2021-05-11 DIAGNOSIS — Z13220 Encounter for screening for lipoid disorders: Secondary | ICD-10-CM | POA: Diagnosis not present

## 2021-05-11 NOTE — Assessment & Plan Note (Signed)
-   CBC ?- Comprehensive metabolic panel ? ? ?2. Screening cholesterol level ? ?- Lipid Panel ? ?Heart healthy diet ? ?Stay active ? ?Stay well hydrated ? ? ?Follow up: ? ?Follow up in 6 months ?

## 2021-05-11 NOTE — Patient Instructions (Addendum)
1. Routine health maintenance ? ?- CBC ?- Comprehensive metabolic panel ? ? ?2. Screening cholesterol level ? ?- Lipid Panel ? ?Heart healthy diet ? ?Stay active ? ?Stay well hydrated ? ? ?Follow up: ? ?Follow up in 6 months ? ?

## 2021-05-11 NOTE — Progress Notes (Signed)
$'@Patient'E$  ID: Madison Walker, female    DOB: [birth year], 56 y.o.   MRN: [id number] ? ?Chief Complaint  ?Patient presents with  ? Follow-up  ? ? ?Referring provider: ?Vevelyn Francois, NP ? ?HPI ? ?Patient presents today for routine health maintenance.  She states that overall she has been doing well.  No new issues or concerns today.  We discussed that we will check labs today.  Patient does have a history of Hashimoto's thyroid disease and is followed by endocrinology for this. Denies f/c/s, n/v/d, hemoptysis, PND, chest pain or edema. ? ? ? ? ? ?Allergies  ?Allergen Reactions  ? Iodine Itching, Rash and Other (See Comments)  ?  Caused rash/hives after a surgery  ? ? ?Immunization History  ?Administered Date(s) Administered  ? Influenza Whole 01/16/2018  ? Influenza,inj,Quad PF,6+ Mos 12/13/2016, 12/30/2019  ? Influenza-Unspecified 12/23/2019  ? PFIZER(Purple Top)SARS-COV-2 Vaccination 09/12/2019, 10/03/2019  ? Tdap 06/28/2004  ? ? ?Past Medical History:  ?Diagnosis Date  ? Heart murmur   ? Hypothyroidism   ? Varicose veins of bilateral lower extremities with other complications   ? ? ?Tobacco History: ?Social History  ? ?Tobacco Use  ?Smoking Status Never  ?Smokeless Tobacco Never  ? ?Counseling given: Not Answered ? ? ?Outpatient Encounter Medications as of 05/11/2021  ?Medication Sig  ? aspirin 81 MG chewable tablet 1 tablet  ? aspirin EC 81 MG tablet Take 81 mg by mouth daily. Swallow whole.  ? clobetasol (TEMOVATE) 0.05 % external solution Apply topically.  ? doxycycline (VIBRA-TABS) 100 MG tablet Take 1 tablet (100 mg total) by mouth 2 (two) times daily.  ? fluticasone (CUTIVATE) 0.05 % cream APPLY TO AFFECTED AREA UP TO TWICE A DAY AS NEEDED  ? gabapentin (NEURONTIN) 300 MG capsule Take 1 capsule (300 mg total) by mouth at bedtime.  ? Krill Oil 500 MG CAPS See admin instructions.  ? levothyroxine (SYNTHROID) 25 MCG tablet Take 0.5 tablets (12.5 mcg total) by mouth daily.  ? levothyroxine (SYNTHROID) 25 MCG tablet  Take by mouth.  ? mupirocin ointment (BACTROBAN) 2 % Apply 1 application topically 2 (two) times daily.  ? Olopatadine HCl 0.2 % SOLN Apply 1 drop to eye daily.  ? ?No facility-administered encounter medications on file as of 05/11/2021.  ? ? ? ?Review of Systems ? ?Review of Systems  ?Constitutional: Negative.   ?HENT: Negative.    ?Cardiovascular: Negative.   ?Gastrointestinal: Negative.   ?Allergic/Immunologic: Negative.   ?Neurological: Negative.   ?Psychiatric/Behavioral: Negative.     ? ? ? ?Physical Exam ? ?BP 127/65   Pulse 64   Resp 16   LMP 09/06/2014 (Approximate)   SpO2 98%  ? ?Wt Readings from Last 5 Encounters:  ?12/07/20 192 lb 12.8 oz (87.5 kg)  ?11/18/20 194 lb (88 kg)  ?07/25/20 182 lb 0.6 oz (82.6 kg)  ?04/19/20 182 lb 6.4 oz (82.7 kg)  ?04/14/20 178 lb (80.7 kg)  ? ? ? ?Physical Exam ?Vitals and nursing note reviewed.  ?Constitutional:   ?   General: She is not in acute distress. ?   Appearance: She is well-developed.  ?Cardiovascular:  ?   Rate and Rhythm: Normal rate and regular rhythm.  ?Pulmonary:  ?   Effort: Pulmonary effort is normal.  ?   Breath sounds: Normal breath sounds.  ?Neurological:  ?   Mental Status: She is alert and oriented to person, place, and time.  ? ? ? ? ?Assessment & Plan:  ? ?Routine health maintenance ?- CBC ?-  Comprehensive metabolic panel ? ? ?2. Screening cholesterol level ? ?- Lipid Panel ? ?Heart healthy diet ? ?Stay active ? ?Stay well hydrated ? ? ?Follow up: ? ?Follow up in 6 months ? ? ? ? ?Fenton Foy, NP ?05/11/2021 ? ?

## 2021-05-12 ENCOUNTER — Encounter: Payer: Self-pay | Admitting: Nurse Practitioner

## 2021-05-12 ENCOUNTER — Telehealth: Payer: Self-pay | Admitting: Nurse Practitioner

## 2021-05-12 LAB — LIPID PANEL
Chol/HDL Ratio: 3.4 ratio (ref 0.0–4.4)
Cholesterol, Total: 151 mg/dL (ref 100–199)
HDL: 44 mg/dL (ref >39)
LDL Chol Calc (NIH): 85 mg/dL (ref 0–99)
Triglycerides: 123 mg/dL (ref 0–149)
VLDL Cholesterol Cal: 22 mg/dL (ref 5–40)

## 2021-05-12 LAB — COMPREHENSIVE METABOLIC PANEL
ALT: 23 IU/L (ref 0–32)
AST: 25 IU/L (ref 0–40)
Albumin/Globulin Ratio: 1.9 (ref 1.2–2.2)
Albumin: 4.6 g/dL (ref 3.8–4.9)
Alkaline Phosphatase: 99 IU/L (ref 44–121)
BUN/Creatinine Ratio: 25 — ABNORMAL HIGH (ref 9–23)
BUN: 18 mg/dL (ref 6–24)
Bilirubin Total: 0.4 mg/dL (ref 0.0–1.2)
CO2: 25 mmol/L (ref 20–29)
Calcium: 9.3 mg/dL (ref 8.7–10.2)
Chloride: 105 mmol/L (ref 96–106)
Creatinine, Ser: 0.72 mg/dL (ref 0.57–1.00)
Globulin, Total: 2.4 g/dL (ref 1.5–4.5)
Glucose: 83 mg/dL (ref 70–99)
Potassium: 4.9 mmol/L (ref 3.5–5.2)
Sodium: 140 mmol/L (ref 134–144)
Total Protein: 7 g/dL (ref 6.0–8.5)
eGFR: 97 mL/min/{1.73_m2} (ref >59)

## 2021-05-12 LAB — CBC
Hematocrit: 37.5 % (ref 34.0–46.6)
Hemoglobin: 12.8 g/dL (ref 11.1–15.9)
MCH: 33.2 pg — ABNORMAL HIGH (ref 26.6–33.0)
MCHC: 34.1 g/dL (ref 31.5–35.7)
MCV: 97 fL (ref 79–97)
Platelets: 208 10*3/uL (ref 150–450)
RBC: 3.86 x10E6/uL (ref 3.77–5.28)
RDW: 12.3 % (ref 11.7–15.4)
WBC: 5.7 10*3/uL (ref 3.4–10.8)

## 2021-05-12 NOTE — Telephone Encounter (Signed)
Patient concerned about lab results in [place] and requested a call back to discuss. ? ?

## 2021-05-15 NOTE — Telephone Encounter (Signed)
Please call to let patient know that her labs were overall normal. Thanks.  ?

## 2021-05-16 DIAGNOSIS — L280 Lichen simplex chronicus: Secondary | ICD-10-CM | POA: Diagnosis not present

## 2021-05-16 DIAGNOSIS — L648 Other androgenic alopecia: Secondary | ICD-10-CM | POA: Diagnosis not present

## 2021-05-16 DIAGNOSIS — L218 Other seborrheic dermatitis: Secondary | ICD-10-CM | POA: Diagnosis not present

## 2021-06-08 ENCOUNTER — Ambulatory Visit: Payer: BC Managed Care – PPO | Admitting: Neurology

## 2021-06-08 ENCOUNTER — Encounter: Payer: Self-pay | Admitting: Neurology

## 2021-06-08 VITALS — BP 142/85 | HR 63 | Ht 68.0 in | Wt 204.2 lb

## 2021-06-08 DIAGNOSIS — R7303 Prediabetes: Secondary | ICD-10-CM

## 2021-06-08 DIAGNOSIS — G629 Polyneuropathy, unspecified: Secondary | ICD-10-CM

## 2021-06-08 DIAGNOSIS — G609 Hereditary and idiopathic neuropathy, unspecified: Secondary | ICD-10-CM | POA: Diagnosis not present

## 2021-06-08 NOTE — Patient Instructions (Addendum)
Options:  ?- Monitor ?- Genetic panel (80 genes + familial amyloidosis) for hereditary ?- 2-hour glucose tolerance test for pre-diabetes ?-May consider daily alpha lipoic acid which is an antioxidant that may reduce free radical oxidative stress associated with diabetic polyneuropathy, existing evidence suggests that alpha lipoic acid significantly reduces stabbing, lancinating and burning pain and diabetic neuropathy with its onset of action as early as 1-2 weeks. 400-'600mg'$ /day ? ? ?Peripheral Neuropathy ?Peripheral neuropathy is a type of nerve damage. It affects nerves that carry signals between the spinal cord and the arms, legs, and the rest of the body (peripheral nerves). It does not affect nerves in the spinal cord or brain. In peripheral neuropathy, one nerve or a group of nerves may be damaged. Peripheral neuropathy is a broad category that includes many specific nerve disorders, like diabetic neuropathy, hereditary neuropathy, and carpal tunnel syndrome. ?What are the causes? ?This condition may be caused by: ?Certain diseases, such as: ?Diabetes. This is the most common cause of peripheral neuropathy. 3 hour glucose tolerance.  ?Autoimmune diseases, such as rheumatoid arthritis and systemic lupus erythematosus. ?Nerve diseases that are passed from parent to child (inherited). - we could check ?Kidney disease. ?Thyroid disease. ?Other causes may include: ?Nerve injury. ?Pressure or stress on a nerve that lasts a long time. ?Lack (deficiency) of B vitamins. This can result from alcoholism, poor diet, or a restricted diet. ?Infections. ?Some medicines, such as cancer medicines (chemotherapy). ?Poisonous (toxic) substances, such as lead and mercury. ?Too little blood flowing to the legs. ?Central obesity ?In some cases, the cause of this condition is not known. ?What are the signs or symptoms? ?Symptoms of this condition depend on which of your nerves is damaged. ?Symptoms in the legs, hands, and arms can  include: ?Loss of feeling (numbness) in the feet, hands, or both. ?Tingling in the feet, hands, or both. ?Burning pain. ?Very sensitive skin. ?Weakness. ?Not being able to move a part of the body (paralysis). ?Clumsiness or poor coordination. ?Muscle twitching. ?Loss of balance. ?Symptoms in other parts of the body can include: ?Not being able to control your bladder. ?Feeling dizzy. ?Sexual problems. ?How is this diagnosed? ?Diagnosing and finding the cause of peripheral neuropathy can be difficult. Your health care provider will take your medical history and do a physical exam. A neurological exam will also be done. This involves checking things that are affected by your brain, spinal cord, and nerves (nervous system). For example, your health care provider will check your reflexes, how you move, and what you can feel. ?You may have other tests, such as: ?Blood tests. ?Electromyogram (EMG) and nerve conduction tests. These tests check nerve function and how well the nerves are controlling the muscles. ?Imaging tests, such as a CT scan or MRI, to rule out other causes of your symptoms. ?Removing a small piece of nerve to be examined in a lab (nerve biopsy). ?Removing and examining a small amount of the fluid that surrounds the brain and spinal cord (lumbar puncture). ?How is this treated? ?Treatment for this condition may involve: ?Treating the underlying cause of the neuropathy, such as diabetes, kidney disease, or vitamin deficiencies. ?Stopping medicines that can cause neuropathy, such as chemotherapy. ?Medicine to help relieve pain. Medicines may include: ?Prescription or over-the-counter pain medicine. ?Anti-seizure medicine. ?Antidepressants. ?Pain-relieving patches that are applied to painful areas of skin. ?Surgery to relieve pressure on a nerve or to destroy a nerve that is causing pain. ?Physical therapy to help improve movement and balance. ?Devices to  help you move around (assistive devices). ?Follow  these instructions at home: ?Medicines ?Take over-the-counter and prescription medicines only as told by your health care provider. Do not take any other medicines without first asking your health care provider. ?Ask your health care provider if the medicine prescribed to you requires you to avoid driving or using machinery. ?Lifestyle ? ?Do not use any products that contain nicotine or tobacco. These products include cigarettes, chewing tobacco, and vaping devices, such as e-cigarettes. Smoking keeps blood from reaching damaged nerves. If you need help quitting, ask your health care provider. ?Avoid or limit alcohol. Too much alcohol can cause a vitamin B deficiency, and vitamin B is needed for healthy nerves. ?Eat a healthy diet. This includes: ?Eating foods that are high in fiber, such as beans, whole grains, and fresh fruits and vegetables. ?Limiting foods that are high in fat and processed sugars, such as fried or sweet foods. ?General instructions ? ?If you have diabetes, work closely with your health care provider to keep your blood sugar under control. ?If you have numbness in your feet: ?Check every day for signs of injury or infection. Watch for redness, warmth, and swelling. ?Wear padded socks and comfortable shoes. These help protect your feet. ?Develop a good support system. Living with peripheral neuropathy can be stressful. Consider talking with a mental health specialist or joining a support group. ?Use assistive devices and attend physical therapy as told by your health care provider. This may include using a walker or a cane. ?Keep all follow-up visits. This is important. ?Where to find more information ?[employer] of Neurological Disorders: [employer]Box[employer]it ?Contact a health care provider if: ?You have new signs or symptoms of peripheral neuropathy. ?You are struggling emotionally from dealing with peripheral neuropathy. ?Your pain is not well controlled. ?Get help right away if: ?You  have an injury or infection that is not healing normally. ?You develop new weakness in an arm or leg. ?You have fallen or do so frequently. ?Summary ?Peripheral neuropathy is when the nerves in the arms or legs are damaged, resulting in numbness, weakness, or pain. ?There are many causes of peripheral neuropathy, including diabetes, pinched nerves, vitamin deficiencies, autoimmune disease, and hereditary conditions. ?Diagnosing and finding the cause of peripheral neuropathy can be difficult. Your health care provider will take your medical history, do a physical exam, and do tests, including blood tests and nerve function tests. ?Treatment involves treating the underlying cause of the neuropathy and taking medicines to help control pain. Physical therapy and assistive devices may also help. ?This information is not intended to replace advice given to you by your health care provider. Make sure you discuss any questions you have with your health care provider. ?Document Revised: 10/04/2020 Document Reviewed: 10/04/2020 ?Elsevier Patient Education ? [place]. ? ?

## 2021-06-08 NOTE — Progress Notes (Signed)
?[facility] ? ? ? ?Provider:  Dr Jaynee Eagles ?Requesting Provider: Vevelyn Francois, NP ?Primary Care Provider:  Fenton Foy, NP ? ?CC:  feet feel cols ? ?06/08/2021: Patient is here after having a biopsy with podiatry.  Patient was told that the biopsy was positive for small fiber neuropathy.  We discussed all the causes of small fiber neuropathy, and in detail we discussed options for her since initial testing showed no cause for small fiber neuropathy. ? ?HPI 12/07/2020:  Madison Walker is a 56 y.o. female here as requested by Vevelyn Francois, NP for neuropathy. PMHx hypothyroidism. I reviewed requesting provider's notes, only stated history is "numbness and tingling in the toes" otherwise no information in paperwork sent on chief complaint.  ? ?She has cold feet and needle prick feeling in her big toe. She went to vein and vascular for her varicose veins and circulation looked good. They actually feel cold to the touch as well. They feel cold right now even with warm socks. No rashes or redness, she has had a little swelling in her ankles. She is seeing endocrinology to help with hypothyroidism and she has not had mediction since august because side effects. Fingers are fine. Toes do not turn colors. No problems in feet summertime just when cold. When it is cold outside like even in the 50s her legs get cold. Actually feels cold to the touch as well, comes and goes. She feels some weakness. She has vericose veins. Not worsening. Off and on. No low back pain associated. Stable, not progressive. No other focal neurologic deficits, associated symptoms, inciting events or modifiable factors. ? ?Reviewed notes, labs and imaging from outside physicians, which showed: ? ?TSH 3.51 ? ?DG lumbar spine 09/2013: reviewed images: FINDINGS:  ?Vertebral body height and alignment are maintained. Mild loss of  ?disc space height at L4-5 and L5-S1 is noted. There is also facet  ?degenerative change at these  levels.  ? ?IMPRESSION:  ?No acute finding.  ? ?Mild appearing lower lumbar spondylosis, unchanged.  ? ?Review of Systems: ?Patient complains of symptoms per HPI as well as the following symptoms thyroid disease. Pertinent negatives and positives per HPI. All others negative. ? ? ?Social History  ? ?Socioeconomic History  ? Marital status: Divorced  ?  Spouse name: Not on file  ? Number of children: Not on file  ? Years of education: Not on file  ? Highest education level: Not on file  ?Occupational History  ? Not on file  ?Tobacco Use  ? Smoking status: Never  ? Smokeless tobacco: Never  ?Vaping Use  ? Vaping Use: Never used  ?Substance and Sexual Activity  ? Alcohol use: No  ? Drug use: No  ? Sexual activity: Not on file  ?Other Topics Concern  ? Not on file  ?Social History Narrative  ? Not on file  ? ?Social Determinants of Health  ? ?Financial Resource Strain: Not on file  ?Food Insecurity: Not on file  ?Transportation Needs: Not on file  ?Physical Activity: Not on file  ?Stress: Not on file  ?Social Connections: Not on file  ?Intimate Partner Violence: Not on file  ? ? ?Family History  ?Problem Relation Age of Onset  ? Hypertension Mother   ? COPD Mother   ? Hernia Mother   ? Diabetes Father   ? COPD Father   ? Hypertension Sister   ? Hypertension Brother   ? Coronary artery disease Brother   ?  MI in his 46's  ? Diabetes Brother   ? Diabetes Other   ? Colon cancer Neg Hx   ? Colon polyps Neg Hx   ? Esophageal cancer Neg Hx   ? Rectal cancer Neg Hx   ? Stomach cancer Neg Hx   ? Neuropathy Neg Hx   ? ? ?Past Medical History:  ?Diagnosis Date  ? Heart murmur   ? Hypothyroidism   ? Varicose veins of bilateral lower extremities with other complications   ? ? ?Patient Active Problem List  ? Diagnosis Date Noted  ? Small fiber neuropathy 06/14/2021  ? Routine health maintenance 05/11/2021  ? Hashimoto's thyroiditis 12/22/2020  ? Hypothyroidism 12/22/2020  ? Hypothyroidism due to Hashimoto's thyroiditis 12/21/2020   ? Palpitations 12/21/2020  ? Other constipation 11/18/2020  ? Nausea without vomiting 11/18/2020  ? Right bundle branch block 09/30/2018  ? Family history of heart disease 06/19/2018  ? Atypical chest pain 11/22/2017  ? Dermoid cyst of ovary (8 cm) 04/07/2015  ? ? ?Past Surgical History:  ?Procedure Laterality Date  ? LAPAROSCOPIC UNILATERAL SALPINGO OOPHERECTOMY Left 05/31/2015  ? Procedure: LAPAROSCOPIC UNILATERAL SALPINGO OOPHORECTOMY;  Surgeon: Osborne Oman, MD;  Location: [facility];  Service: Gynecology;  Laterality: Left;  ? TUBAL LIGATION    ? UNILATERAL SALPINGECTOMY Right 05/31/2015  ? Procedure: UNILATERAL SALPINGECTOMY;  Surgeon: Osborne Oman, MD;  Location: [facility];  Service: Gynecology;  Laterality: Right;  ? ? ?Current Outpatient Medications  ?Medication Sig Dispense Refill  ? aspirin 81 MG chewable tablet 1 tablet    ? clobetasol (TEMOVATE) 0.05 % external solution Apply topically.    ? doxycycline (VIBRA-TABS) 100 MG tablet Take 1 tablet (100 mg total) by mouth 2 (two) times daily. 20 tablet 0  ? fluticasone (CUTIVATE) 0.05 % cream APPLY TO AFFECTED AREA UP TO TWICE A DAY AS NEEDED    ? Krill Oil 500 MG CAPS See admin instructions.    ? levothyroxine (SYNTHROID) 25 MCG tablet Take 0.5 tablets (12.5 mcg total) by mouth daily. 45 tablet 2  ? mupirocin ointment (BACTROBAN) 2 % Apply 1 application topically 2 (two) times daily. 30 g 2  ? Olopatadine HCl 0.2 % SOLN Apply 1 drop to eye daily. 2.5 mL 0  ? aspirin EC 81 MG tablet Take 81 mg by mouth daily. Swallow whole.    ? gabapentin (NEURONTIN) 300 MG capsule Take 1 capsule (300 mg total) by mouth at bedtime. 90 capsule 0  ? levothyroxine (SYNTHROID) 25 MCG tablet Take by mouth.    ? ?No current facility-administered medications for this visit.  ? ? ?Allergies as of 06/08/2021 - Review Complete 06/08/2021  ?Allergen Reaction Noted  ? Iodine Itching, Rash, and Other (See Comments) 06/20/2015  ? ? ?Vitals: ?BP (!) 142/85   Pulse 63   Ht '5\' 8"'$  (1.727  m)   Wt 204 lb 3.2 oz (92.6 kg)   LMP 09/06/2014 (Approximate)   BMI 31.05 kg/m?  ?Last Weight:  ?Wt Readings from Last 1 Encounters:  ?06/08/21 204 lb 3.2 oz (92.6 kg)  ? ?Last Height:   ?Ht Readings from Last 1 Encounters:  ?06/08/21 '5\' 8"'$  (1.727 m)  ? ? ? ?Physical exam: ?Exam: ?Gen: NAD, conversant, well nourised, overwight, well groomed                     ?CV: RRR, no MRG. No Carotid Bruits. No peripheral edema, warm, nontender ?Eyes: Conjunctivae clear without exudates or hemorrhage ? ?Neuro: ?  Detailed Neurologic Exam ? ?Speech: ?   Speech is normal; fluent and spontaneous with normal comprehension.  ?Cognition: ?   The patient is oriented to person, place, and time;  ?   recent and remote memory intact;  ?   language fluent;  ?   normal attention, concentration,  ?   fund of knowledge ?Cranial Nerves: ?   The pupils are equal, round, and reactive to light. The fundi are flat. Visual fields are full to finger confrontation. Extraocular movements are intact. Trigeminal sensation is intact and the muscles of mastication are normal. The face is symmetric. The palate elevates in the midline. Hearing intact. Voice is normal. Shoulder shrug is normal. The tongue has normal motion without fasciculations.  ? ?Coordination: ?   Normal  ? ?Gait: ?   normal.  ? ?Motor Observation: ?   No asymmetry, no atrophy, and no involuntary movements noted. ?Tone: ?   Normal muscle tone.   ? ?Posture: ?   Posture is normal. normal erect ?   ?Strength: ?   Strength is V/V in the upper and lower limbs.  ?    ?Sensation: intact to LT. Decreased slightly to temperature dista;;y in the feet, intact to pin prick, vibration ?    ?Reflex Exam: ? ?DTR's: ? Trace AJs otherwise  Deep tendon reflexes in the upper and lower extremities are normal bilaterally.   ?Toes: ?   The toes are downgoing bilaterally.   ?Clonus: ?   Clonus is absent. ?  ? ?Assessment/Plan:  A very nice patient with cold feet. Patient is here after having a biopsy with  podiatry.  Patient was told that the biopsy was positive for small fiber neuropathy.  We discussed all the causes of small fiber neuropathy, and in detail we discussed options for her since initial testing showe

## 2021-06-13 ENCOUNTER — Telehealth: Payer: Self-pay | Admitting: Neurology

## 2021-06-13 NOTE — Telephone Encounter (Signed)
Pt asking is someone going to call her to schedule the glucose tolerance test and other test Dr. Jaynee Eagles mentioned at last office visit. Would like a call from the nurse to discuss. ?

## 2021-06-13 NOTE — Telephone Encounter (Signed)
I called pt and I relayed that no orders from last visit.  I do see note about " test her for serum causes of neuropathy and see if we find anything".   Pt mentioned glucose tolerance test and other (not sure).  Please advise. ?

## 2021-06-14 ENCOUNTER — Other Ambulatory Visit: Payer: Self-pay | Admitting: *Deleted

## 2021-06-14 DIAGNOSIS — G629 Polyneuropathy, unspecified: Secondary | ICD-10-CM | POA: Insufficient documentation

## 2021-06-14 NOTE — Telephone Encounter (Signed)
LMVM for pt that will do the fasting glucose test.  Have to order that and when lab get will call to get you scheduled.  Believe to be week of 15th per Dr. Jaynee Eagles.  If she has questions can call or mychart.   No other labs mentioned at this time.   ?

## 2021-06-15 ENCOUNTER — Other Ambulatory Visit: Payer: Self-pay

## 2021-06-15 ENCOUNTER — Telehealth: Payer: Self-pay | Admitting: *Deleted

## 2021-06-15 NOTE — Telephone Encounter (Signed)
Per Dr Jaynee Eagles, order form completed for [facility] genetic testing for familiar amyloidosis (aTTR).  Order form signed by Dr Jaynee Eagles and faxed to [facility] asking for them to contact patient and schedule shipment of specimen kit to patient. Received a receipt of confirmation. ? ?

## 2021-06-16 ENCOUNTER — Other Ambulatory Visit: Payer: Self-pay

## 2021-06-16 MED ORDER — CLOBETASOL PROPIONATE 0.05 % EX OINT
TOPICAL_OINTMENT | CUTANEOUS | 1 refills | Status: DC
  Filled 2021-06-16 – 2021-08-31 (×2): qty 30, 15d supply, fill #0

## 2021-06-19 ENCOUNTER — Telehealth: Payer: Self-pay | Admitting: *Deleted

## 2021-06-19 NOTE — Telephone Encounter (Signed)
Patient is calling because the place on the ankle is still tender and sore. Please advise. ?

## 2021-06-20 NOTE — Telephone Encounter (Signed)
Can you schedule patient to be seen for ankle that is still sore and tender?

## 2021-06-20 NOTE — Telephone Encounter (Signed)
Left message for pt to call to schedule an appt for her ankle . ?

## 2021-06-22 ENCOUNTER — Other Ambulatory Visit: Payer: Self-pay

## 2021-06-27 NOTE — Telephone Encounter (Signed)
I called pt and relayed that will be doing 2 hour glucose per Dr. Jaynee Eagles and this will be ordered by [facility] tomorrow and hopefully her next Thursday appt 07-06-2021 glucose will be available.  If not will cancel and reschedule.  Pt was made aware of change.  She verbalized understanding.  ?

## 2021-06-30 ENCOUNTER — Ambulatory Visit: Payer: BC Managed Care – PPO | Admitting: Podiatry

## 2021-06-30 DIAGNOSIS — L97921 Non-pressure chronic ulcer of unspecified part of left lower leg limited to breakdown of skin: Secondary | ICD-10-CM | POA: Diagnosis not present

## 2021-06-30 DIAGNOSIS — R2243 Localized swelling, mass and lump, lower limb, bilateral: Secondary | ICD-10-CM

## 2021-07-02 NOTE — Progress Notes (Signed)
Subjective: 56 year old female presents the office today to have the biopsy site on the left leg checked as it still causes some discomfort.  No drainage or pus that she reports no swelling or redness.  She does have pitting edema present in both legs.  She denies any chest pain shortness of breath.  She has no other concerns.   Objective: AAO x3, NAD DP/PT pulses palpable bilaterally, CRT less than 3 seconds Pitting edema present bilaterally without any erythema or warmth. Along the lateral aspect along the biopsy site small superficial scab is present.  Upon debridement there is small superficial granular wound is present without any probing, undermining or tunneling appears to be abrasion.  Mild rim of darkened inflamed tissue but there is no significant cellulitis there is no ascending cellulitis.  No drainage or pus No pain with calf compression, swelling, warmth, erythema  Assessment: Healing wound from biopsy, pitting edema  Plan: -All treatment options discussed with the patient including all alternatives, risks, complications.  -Sharply debrided the hyperkeratotic tissue along the area of the wound without any complications or bleeding.  Recommend a small mount of Vaseline daily.  Monitor closely for any signs or symptoms of infection.  No overt cellulitis is noted today. -Recommend follow-up with her primary care doctor in regards to the swelling of both legs.  Encouraged elevation.  Compression socks. -Patient encouraged to call the office with any questions, concerns, change in symptoms.   Return in about 4 weeks (around 07/28/2021), or if symptoms worsen or fail to improve.  Trula Slade DPM

## 2021-07-06 ENCOUNTER — Other Ambulatory Visit (INDEPENDENT_AMBULATORY_CARE_PROVIDER_SITE_OTHER): Payer: Self-pay

## 2021-07-06 DIAGNOSIS — R7303 Prediabetes: Secondary | ICD-10-CM

## 2021-07-07 LAB — GLUCOSE TOLERANCE, 2 HOURS
Glucose, 2 hour: 72 mg/dL (ref 70–139)
Glucose, GTT - Fasting: 87 mg/dL (ref 70–99)

## 2021-07-13 ENCOUNTER — Encounter: Payer: Self-pay | Admitting: Neurology

## 2021-07-14 ENCOUNTER — Encounter: Payer: Self-pay | Admitting: Neurology

## 2021-07-17 ENCOUNTER — Telehealth: Payer: Self-pay | Admitting: Neurology

## 2021-07-17 NOTE — Telephone Encounter (Signed)
I called pt and relayed that her 2 hour glucose tolerance test was normal.  Did not show diabetes. Her HA1c back in 11/2020 was normal (no prediabetes).  She is concerned due to her family history that she wants to make sure she does not have prediabetes.  I relayed that she does not show this.  I mentioned that her [facility] testing will look for neuropathy genetic causes.  She will do 6-7- or 6-8- this week.  Will call her back with results. I answered her questions.  Will call back as needed.  She see's endocrinologist for low thyroid.

## 2021-07-17 NOTE — Telephone Encounter (Signed)
Pt said, having concerns about pre diabetes due to have family history. Would like to go over to the numbers due neuropathy in my legs. Would like a call from the nurse.

## 2021-07-17 NOTE — Telephone Encounter (Signed)
Dr Jaynee Eagles already responded to pt in a previous encounter.

## 2021-07-25 ENCOUNTER — Ambulatory Visit: Payer: BC Managed Care – PPO | Admitting: Podiatry

## 2021-07-27 ENCOUNTER — Ambulatory Visit: Payer: BC Managed Care – PPO | Admitting: Podiatry

## 2021-08-11 ENCOUNTER — Ambulatory Visit: Payer: BC Managed Care – PPO | Admitting: Podiatry

## 2021-08-11 DIAGNOSIS — R2243 Localized swelling, mass and lump, lower limb, bilateral: Secondary | ICD-10-CM | POA: Diagnosis not present

## 2021-08-11 DIAGNOSIS — L97921 Non-pressure chronic ulcer of unspecified part of left lower leg limited to breakdown of skin: Secondary | ICD-10-CM | POA: Diagnosis not present

## 2021-08-11 NOTE — Progress Notes (Signed)
Subjective: 56 year old female presents the office today for follow-up evaluation after delayed healing of a biopsy of the left side.  She states that is almost there.  She states that she gets a " slight little twinge when I rub my fingers over it" no drainage or pus or any swelling or redness.  She does keep a wrap on it.  No fevers or chills.  No other concerns.  Objective: AAO x3, NAD DP/PT pulses palpable bilaterally, CRT less than 3 seconds On the lateral aspect of the left leg on the biopsy site this is almost completely healed.  Small scab is still present there is no drainage or pus.  No surrounding erythema, ascending cellulitis.  There is no fluctuance or crepitation present moderate.  No pain. Pitting edema present bilaterally. No pain with calf compression, swelling, warmth, erythema  Assessment: Healing wound from biopsy, pitting edema  Plan: -All treatment options discussed with the patient including all alternatives, risks, complications.  -Recommend follow-up with her primary care doctor in regards to the swelling.  Encouraged compression, elevation, limiting salt intake. -The biopsy site appears to be almost completely healed no signs of infection.  She also feels is getting better.  Recommended small mount of Vaseline to the area. -Monitor for any clinical signs or symptoms of infection and directed to call the office immediately should any occur or go to the ER.  Return if symptoms worsen or fail to improve.  Trula Slade DPM

## 2021-08-16 ENCOUNTER — Ambulatory Visit
Admission: EM | Admit: 2021-08-16 | Discharge: 2021-08-16 | Disposition: A | Discharge status: Home / Self Care | Payer: BC Managed Care – PPO | Attending: Internal Medicine | Admitting: Internal Medicine

## 2021-08-16 ENCOUNTER — Ambulatory Visit (INDEPENDENT_AMBULATORY_CARE_PROVIDER_SITE_OTHER): Payer: BC Managed Care – PPO

## 2021-08-16 DIAGNOSIS — J209 Acute bronchitis, unspecified: Secondary | ICD-10-CM | POA: Diagnosis not present

## 2021-08-16 DIAGNOSIS — R059 Cough, unspecified: Secondary | ICD-10-CM

## 2021-08-16 MED ORDER — FLUTICASONE PROPIONATE 50 MCG/ACT NA SUSP: 1.0000 | Freq: Every day | NASAL | 0 refills | Status: DC

## 2021-08-16 MED ORDER — ALBUTEROL SULFATE HFA 108 (90 BASE) MCG/ACT IN AERS: 1.0000 | INHALATION_SPRAY | Freq: Four times a day (QID) | RESPIRATORY_TRACT | 0 refills | Status: DC | PRN

## 2021-08-16 MED ORDER — BENZONATATE 100 MG PO CAPS: 100.0000 mg | ORAL_CAPSULE | Freq: Three times a day (TID) | ORAL | 0 refills | Status: DC | PRN

## 2021-08-16 NOTE — ED Triage Notes (Signed)
Pt present coughing with congestion and scratchy throat. Symptoms started two weeks ago. Pt states her eyes has been watery.

## 2021-08-16 NOTE — ED Provider Notes (Signed)
[name]    CSN: [id number] Arrival date & time: 08/16/21  1407      History   Chief Complaint Chief Complaint  Patient presents with   Cough    HPI Madison Walker is a 56 y.o. female.   Patient presents with nasal congestion and cough that has been present for approximately 2 weeks.  Denies any known sick contacts or fever.  Denies chest pain, shortness of breath, sore throat, ear pain, nausea, vomiting, diarrhea, abdominal pain.  Patient has taken over-the-counter cough and cold medication with minimal improvement.  Denies history of asthma or COPD and patient is not a smoker.  Patient has slightly elevated blood pressure reading but denies chest pain, shortness of breath, headache, dizziness, blurred vision, nausea, vomiting.   Cough   Past Medical History:  Diagnosis Date   Heart murmur    Hypothyroidism    Varicose veins of bilateral lower extremities with other complications     Patient Active Problem List   Diagnosis Date Noted   Small fiber neuropathy 06/14/2021   Routine health maintenance 05/11/2021   Hashimoto's thyroiditis 12/22/2020   Hypothyroidism 12/22/2020   Hypothyroidism due to Hashimoto's thyroiditis 12/21/2020   Palpitations 12/21/2020   Other constipation 11/18/2020   Nausea without vomiting 11/18/2020   Right bundle branch block 09/30/2018   Family history of heart disease 06/19/2018   Atypical chest pain 11/22/2017   Dermoid cyst of ovary (8 cm) 04/07/2015    Past Surgical History:  Procedure Laterality Date   LAPAROSCOPIC UNILATERAL SALPINGO OOPHERECTOMY Left 05/31/2015   Procedure: LAPAROSCOPIC UNILATERAL SALPINGO OOPHORECTOMY;  Surgeon: Osborne Oman, MD;  Location: [facility];  Service: Gynecology;  Laterality: Left;   TUBAL LIGATION     UNILATERAL SALPINGECTOMY Right 05/31/2015   Procedure: UNILATERAL SALPINGECTOMY;  Surgeon: Osborne Oman, MD;  Location: [facility];  Service: Gynecology;  Laterality: Right;    OB History      Gravida  2   Para  2   Term  2   Preterm      AB      Living  2      SAB      IAB      Ectopic      Multiple      Live Births               Home Medications    Prior to Admission medications   Medication Sig Start Date End Date Taking? Authorizing Provider  albuterol (VENTOLIN HFA) 108 (90 Base) MCG/ACT inhaler Inhale 1-2 puffs into the lungs every 6 (six) hours as needed for wheezing or shortness of breath. 08/16/21  Yes Hannah Strader, Hildred Alamin E, FNP  benzonatate (TESSALON) 100 MG capsule Take 1 capsule (100 mg total) by mouth every 8 (eight) hours as needed for cough. 08/16/21  Yes Kong Packett, Hildred Alamin E, FNP  fluticasone (FLONASE) 50 MCG/ACT nasal spray Place 1 spray into both nostrils daily. 08/16/21  Yes Salia Cangemi, Michele Rockers, FNP  aspirin 81 MG chewable tablet 1 tablet    [provider]  aspirin EC 81 MG tablet Take 81 mg by mouth daily. Swallow whole.    [provider]  clobetasol (TEMOVATE) 0.05 % external solution Apply topically. 12/07/20   [provider]  clobetasol ointment (TEMOVATE) 0.05 % Apply topically a thin layer to affected area twice daily 06/15/21   Marylynn Pearson, MD  doxycycline (VIBRA-TABS) 100 MG tablet Take 1 tablet (100 mg total) by mouth 2 (two)  times daily. 04/03/21   Trula Slade, DPM  fluticasone (CUTIVATE) 0.05 % cream APPLY TO AFFECTED AREA UP TO TWICE A DAY AS NEEDED 11/16/20   [provider]  gabapentin (NEURONTIN) 300 MG capsule Take 1 capsule (300 mg total) by mouth at bedtime. 03/09/21   Trula Slade, DPM  Krill Oil 500 MG CAPS See admin instructions.    [provider]  levothyroxine (SYNTHROID) 25 MCG tablet Take 0.5 tablets (12.5 mcg total) by mouth daily. 12/22/20     levothyroxine (SYNTHROID) 25 MCG tablet Take by mouth. 12/22/20   [provider]  mupirocin ointment (BACTROBAN) 2 % Apply 1 application topically 2 (two) times daily. 04/03/21   Trula Slade, DPM  Olopatadine HCl  0.2 % SOLN Apply 1 drop to eye daily. 07/30/20   Vanessa Kick, MD    Family History Family History  Problem Relation Age of Onset   Hypertension Mother    COPD Mother    Hernia Mother    Diabetes Father    COPD Father    Hypertension Sister    Hypertension Brother    Coronary artery disease Brother        MI in his 29's   Diabetes Brother    Diabetes Other    Colon cancer Neg Hx    Colon polyps Neg Hx    Esophageal cancer Neg Hx    Rectal cancer Neg Hx    Stomach cancer Neg Hx    Neuropathy Neg Hx     Social History Social History   Tobacco Use   Smoking status: Never   Smokeless tobacco: Never  Vaping Use   Vaping Use: Never used  Substance Use Topics   Alcohol use: No   Drug use: No     Allergies   Iodine   Review of Systems Review of Systems Per HPI  Physical Exam Triage Vital Signs ED Triage Vitals  Enc Vitals Group     BP 08/16/21 1432 (!) 163/90     Pulse Rate 08/16/21 1432 65     Resp 08/16/21 1432 18     Temp 08/16/21 1432 98.1 F (36.7 C)     Temp Source 08/16/21 1432 Oral     SpO2 08/16/21 1432 95 %     Weight --      Height --      Head Circumference --      Peak Flow --      Pain Score 08/16/21 1431 0     Pain Loc --      Pain Edu? --      Excl. in [place]? --    No data found.  Updated Vital Signs BP (!) 163/90 (BP Location: Left Arm)   Pulse 65   Temp 98.1 F (36.7 C) (Oral)   Resp 18   LMP 09/06/2014 (Approximate)   SpO2 95%   Visual Acuity Right Eye Distance:   Left Eye Distance:   Bilateral Distance:    Right Eye Near:   Left Eye Near:    Bilateral Near:     Physical Exam Constitutional:      General: She is not in acute distress.    Appearance: Normal appearance. She is not toxic-appearing or diaphoretic.  HENT:     Head: Normocephalic and atraumatic.     Right Ear: Tympanic membrane and ear canal normal.     Left Ear: Tympanic membrane and ear canal normal.     Nose: Congestion present.  Mouth/Throat:      Mouth: Mucous membranes are moist.     Pharynx: No posterior oropharyngeal erythema.  Eyes:     Extraocular Movements: Extraocular movements intact.     Conjunctiva/sclera: Conjunctivae normal.     Pupils: Pupils are equal, round, and reactive to light.  Cardiovascular:     Rate and Rhythm: Normal rate and regular rhythm.     Pulses: Normal pulses.     Heart sounds: Normal heart sounds.  Pulmonary:     Effort: Pulmonary effort is normal. No respiratory distress.     Breath sounds: Normal breath sounds. No stridor. No wheezing, rhonchi or rales.  Abdominal:     General: Abdomen is flat. Bowel sounds are normal.     Palpations: Abdomen is soft.  Musculoskeletal:        General: Normal range of motion.     Cervical back: Normal range of motion.  Skin:    General: Skin is warm and dry.  Neurological:     General: No focal deficit present.     Mental Status: She is alert and oriented to person, place, and time. Mental status is at baseline.     Cranial Nerves: Cranial nerves 2-12 are intact.     Sensory: Sensation is intact.     Motor: Motor function is intact.     Coordination: Coordination is intact.     Gait: Gait is intact.  Psychiatric:        Mood and Affect: Mood normal.        Behavior: Behavior normal.      UC Treatments / Results  Labs (all labs ordered are listed, but only abnormal results are displayed) Labs Reviewed - No data to display  EKG   Radiology DG Chest 2 View  Result Date: 08/16/2021 CLINICAL DATA:  cough EXAM: CHEST - 2 VIEW.  Patient is slightly rotated on frontal view COMPARISON:  Chest x-ray 09/04/2017 FINDINGS: The heart and mediastinal contours are unchanged. Persistent prominent hilar vasculature. Biapical pleural/pulmonary scarring. No focal consolidation. Increased interstitial markings. No pleural effusion. No pneumothorax. No acute osseous abnormality. IMPRESSION: Pulmonary edema versus bronchitic changes. Electronically Signed   By: Iven Finn M.D.   On: 08/16/2021 15:03    Procedures Procedures (including critical care time)  Medications Ordered in UC Medications - No data to display  Initial Impression / Assessment and Plan / UC Course  I have reviewed the triage vital signs and the nursing notes.  Pertinent labs & imaging results that were available during my care of the patient were reviewed by me and considered in my medical decision making (see chart for details).     Chest x-ray indicating pulmonary edema versus acute bronchitis.  No suspicion for pulmonary edema given patient has a viral symptoms that are present.  Will treat for bronchitis.  Do not think that antibiotic therapy is necessary given that is most likely viral in nature.  Patient sent medications including albuterol inhaler, Flonase, benzonatate.  Patient encouraged to follow-up if symptoms persist or worsen.  Blood pressure recheck was 811 systolic.  No signs of hypertensive urgency or endorgan damage and neuro exam is normal.  Patient encouraged to monitor blood pressure at home.  Suspect that patient's elevated blood pressure could be due to taking over-the-counter cold and flu medications.  Patient verbalized understanding and was agreeable with plan. Final Clinical Impressions(s) / UC Diagnoses   Final diagnoses:  Acute bronchitis, unspecified organism     Discharge Instructions  Your chest x-ray is indicating acute bronchitis which is most likely viral in nature.  You have been prescribed 3 medications to alleviate symptoms.  Please follow-up if symptoms persist or worsen.     ED Prescriptions     Medication Sig Dispense Auth. Provider   fluticasone (FLONASE) 50 MCG/ACT nasal spray Place 1 spray into both nostrils daily. 16 g [place] Gelin, Hildred Alamin E, [place]   benzonatate (TESSALON) 100 MG capsule Take 1 capsule (100 mg total) by mouth every 8 (eight) hours as needed for cough. 21 capsule Prosser, [place] E, [place]   albuterol (VENTOLIN HFA) 108 (90  Base) MCG/ACT inhaler Inhale 1-2 puffs into the lungs every 6 (six) hours as needed for wheezing or shortness of breath. 1 each Teodora Medici, [place]      PDMP not reviewed this encounter.   Teodora Medici, [place] 08/16/21 1529

## 2021-08-16 NOTE — Discharge Instructions (Signed)
Your chest x-ray is indicating acute bronchitis which is most likely viral in nature.  You have been prescribed 3 medications to alleviate symptoms.  Please follow-up if symptoms persist or worsen.

## 2021-08-25 ENCOUNTER — Ambulatory Visit (INDEPENDENT_AMBULATORY_CARE_PROVIDER_SITE_OTHER): Payer: BC Managed Care – PPO

## 2021-08-25 ENCOUNTER — Ambulatory Visit
Admission: EM | Admit: 2021-08-25 | Discharge: 2021-08-25 | Disposition: A | Discharge status: Home / Self Care | Payer: BC Managed Care – PPO | Attending: Student | Admitting: Student

## 2021-08-25 DIAGNOSIS — J029 Acute pharyngitis, unspecified: Secondary | ICD-10-CM

## 2021-08-25 DIAGNOSIS — R059 Cough, unspecified: Secondary | ICD-10-CM | POA: Diagnosis not present

## 2021-08-25 DIAGNOSIS — J40 Bronchitis, not specified as acute or chronic: Secondary | ICD-10-CM

## 2021-08-25 DIAGNOSIS — J329 Chronic sinusitis, unspecified: Secondary | ICD-10-CM | POA: Diagnosis not present

## 2021-08-25 MED ORDER — PREDNISONE 10 MG (21) PO TBPK: ORAL_TABLET | Freq: Every day | ORAL | 0 refills | Status: DC

## 2021-08-25 MED ORDER — AMOXICILLIN-POT CLAVULANATE 875-125 MG PO TABS: 1.0000 | ORAL_TABLET | Freq: Two times a day (BID) | ORAL | 0 refills | Status: DC

## 2021-08-25 NOTE — ED Triage Notes (Signed)
Pt c/o cough, sneezing, itchy nose, sore throat, states was dx w/ bronchitis ~ 3 weeks ago, says she is not getting better.

## 2021-08-25 NOTE — Discharge Instructions (Addendum)
-  Your chest xray looks good today. You don't have pneumonia. We will treat your bronchitis with a steroid for inflammation. Continue the albuterol and flonase if it's helping.  -Start the antibiotic-Augmentin (amoxicillin-clavulanate), 1 pill every 12 hours for 7 days.  You can take this with food like with breakfast and dinner. -Prednisone taper for cough/bronchitis. I recommend taking this in the morning as it could give you energy.  Avoid NSAIDs like ibuprofen and alleve while taking this medication as they can increase your risk of stomach upset and even GI bleeding when in combination with a steroid. You can continue tylenol (acetaminophen) up to '1000mg'$  3x daily.

## 2021-08-25 NOTE — ED Provider Notes (Signed)
[name]    CSN: [id number] Arrival date & time: 08/25/21  1338      History   Chief Complaint Chief Complaint  Patient presents with   Cough    HPI Madison Walker is a 56 y.o. female presenting with congestion and cough for approximately 3 weeks.  We last saw her on 08/16/2021, at that time chest x-ray was reassuring and she was managed with albuterol and Flonase and Tessalon.  She states the cough has improved minimally.  It is nonproductive, and there is no associated dyspnea or chest pain or fevers.  She states the nasal congestion has persisted, it is clear.  Denies facial pain. Nonsmoker.   HPI  Past Medical History:  Diagnosis Date   Heart murmur    Hypothyroidism    Varicose veins of bilateral lower extremities with other complications     Patient Active Problem List   Diagnosis Date Noted   Small fiber neuropathy 06/14/2021   Routine health maintenance 05/11/2021   Hashimoto's thyroiditis 12/22/2020   Hypothyroidism 12/22/2020   Hypothyroidism due to Hashimoto's thyroiditis 12/21/2020   Palpitations 12/21/2020   Other constipation 11/18/2020   Nausea without vomiting 11/18/2020   Right bundle branch block 09/30/2018   Family history of heart disease 06/19/2018   Atypical chest pain 11/22/2017   Dermoid cyst of ovary (8 cm) 04/07/2015    Past Surgical History:  Procedure Laterality Date   LAPAROSCOPIC UNILATERAL SALPINGO OOPHERECTOMY Left 05/31/2015   Procedure: LAPAROSCOPIC UNILATERAL SALPINGO OOPHORECTOMY;  Surgeon: Osborne Oman, MD;  Location: [facility];  Service: Gynecology;  Laterality: Left;   TUBAL LIGATION     UNILATERAL SALPINGECTOMY Right 05/31/2015   Procedure: UNILATERAL SALPINGECTOMY;  Surgeon: Osborne Oman, MD;  Location: [facility];  Service: Gynecology;  Laterality: Right;    OB History     Gravida  2   Para  2   Term  2   Preterm      AB      Living  2      SAB      IAB      Ectopic      Multiple       Live Births               Home Medications    Prior to Admission medications   Medication Sig Start Date End Date Taking? Authorizing Provider  amoxicillin-clavulanate (AUGMENTIN) 875-125 MG tablet Take 1 tablet by mouth every 12 (twelve) hours. 08/25/21  Yes Hazel Sams, PA-C  predniSONE (STERAPRED UNI-PAK 21 TAB) 10 MG (21) TBPK tablet Take by mouth daily. Take 6 tabs by mouth daily  for 2 days, then 5 tabs for 2 days, then 4 tabs for 2 days, then 3 tabs for 2 days, 2 tabs for 2 days, then 1 tab by mouth daily for 2 days 08/25/21  Yes Phillip Heal, Sherlon Handing, PA-C  albuterol (VENTOLIN HFA) 108 (90 Base) MCG/ACT inhaler Inhale 1-2 puffs into the lungs every 6 (six) hours as needed for wheezing or shortness of breath. 08/16/21   Teodora Medici, FNP  aspirin 81 MG chewable tablet 1 tablet    [provider]  aspirin EC 81 MG tablet Take 81 mg by mouth daily. Swallow whole.    [provider]  benzonatate (TESSALON) 100 MG capsule Take 1 capsule (100 mg total) by mouth every 8 (eight) hours as needed for cough. 08/16/21   Teodora Medici, FNP  clobetasol (TEMOVATE) 0.05 %  external solution Apply topically. 12/07/20   [provider]  clobetasol ointment (TEMOVATE) 0.05 % Apply topically a thin layer to affected area twice daily 06/15/21   Marylynn Pearson, MD  fluticasone (CUTIVATE) 0.05 % cream APPLY TO AFFECTED AREA UP TO TWICE A DAY AS NEEDED 11/16/20   [provider]  fluticasone (FLONASE) 50 MCG/ACT nasal spray Place 1 spray into both nostrils daily. 08/16/21   Teodora Medici, FNP  gabapentin (NEURONTIN) 300 MG capsule Take 1 capsule (300 mg total) by mouth at bedtime. 03/09/21   Trula Slade, DPM  Krill Oil 500 MG CAPS See admin instructions.    [provider]  levothyroxine (SYNTHROID) 25 MCG tablet Take 0.5 tablets (12.5 mcg total) by mouth daily. 12/22/20     levothyroxine (SYNTHROID) 25 MCG tablet Take by mouth. 12/22/20   [provider]   mupirocin ointment (BACTROBAN) 2 % Apply 1 application topically 2 (two) times daily. 04/03/21   Trula Slade, DPM  Olopatadine HCl 0.2 % SOLN Apply 1 drop to eye daily. 07/30/20   Vanessa Kick, MD    Family History Family History  Problem Relation Age of Onset   Hypertension Mother    COPD Mother    Hernia Mother    Diabetes Father    COPD Father    Hypertension Sister    Hypertension Brother    Coronary artery disease Brother        MI in his 38's   Diabetes Brother    Diabetes Other    Colon cancer Neg Hx    Colon polyps Neg Hx    Esophageal cancer Neg Hx    Rectal cancer Neg Hx    Stomach cancer Neg Hx    Neuropathy Neg Hx     Social History Social History   Tobacco Use   Smoking status: Never   Smokeless tobacco: Never  Vaping Use   Vaping Use: Never used  Substance Use Topics   Alcohol use: No   Drug use: No     Allergies   Iodine   Review of Systems Review of Systems  Constitutional:  Negative for appetite change, chills and fever.  HENT:  Positive for congestion. Negative for ear pain, rhinorrhea, sinus pressure, sinus pain and sore throat.   Eyes:  Negative for redness and visual disturbance.  Respiratory:  Positive for cough. Negative for chest tightness, shortness of breath and wheezing.   Cardiovascular:  Negative for chest pain and palpitations.  Gastrointestinal:  Negative for abdominal pain, constipation, diarrhea, nausea and vomiting.  Genitourinary:  Negative for dysuria, frequency and urgency.  Musculoskeletal:  Negative for myalgias.  Neurological:  Negative for dizziness, weakness and headaches.  Psychiatric/Behavioral:  Negative for confusion.   All other systems reviewed and are negative.    Physical Exam Triage Vital Signs ED Triage Vitals  Enc Vitals Group     BP 08/25/21 1355 134/81     Pulse Rate 08/25/21 1355 66     Resp 08/25/21 1355 18     Temp 08/25/21 1355 98.5 F (36.9 C)     Temp Source 08/25/21 1355 Oral      SpO2 08/25/21 1355 95 %     Weight --      Height --      Head Circumference --      Peak Flow --      Pain Score 08/25/21 1356 0     Pain Loc --      Pain  Edu? --      Excl. in [place]? --    No data found.  Updated Vital Signs BP 134/81 (BP Location: Left Arm)   Pulse 66   Temp 98.5 F (36.9 C) (Oral)   Resp 18   LMP 09/06/2014 (Approximate)   SpO2 95%   Visual Acuity Right Eye Distance:   Left Eye Distance:   Bilateral Distance:    Right Eye Near:   Left Eye Near:    Bilateral Near:     Physical Exam Vitals reviewed.  Constitutional:      General: She is not in acute distress.    Appearance: Normal appearance. She is not ill-appearing.  HENT:     Head: Normocephalic and atraumatic.     Right Ear: Tympanic membrane, ear canal and external ear normal. No tenderness. No middle ear effusion. There is no impacted cerumen. Tympanic membrane is not perforated, erythematous, retracted or bulging.     Left Ear: Tympanic membrane, ear canal and external ear normal. No tenderness.  No middle ear effusion. There is no impacted cerumen. Tympanic membrane is not perforated, erythematous, retracted or bulging.     Nose: Nose normal. No congestion.     Mouth/Throat:     Mouth: Mucous membranes are moist.     Pharynx: Uvula midline. No oropharyngeal exudate or posterior oropharyngeal erythema.  Eyes:     Extraocular Movements: Extraocular movements intact.     Pupils: Pupils are equal, round, and reactive to light.  Cardiovascular:     Rate and Rhythm: Normal rate and regular rhythm.     Heart sounds: Normal heart sounds.  Pulmonary:     Effort: Pulmonary effort is normal.     Breath sounds: Normal breath sounds. No decreased breath sounds, wheezing, rhonchi or rales.  Abdominal:     Palpations: Abdomen is soft.     Tenderness: There is no abdominal tenderness. There is no guarding or rebound.  Lymphadenopathy:     Cervical: No cervical adenopathy.     Right cervical: No  superficial cervical adenopathy.    Left cervical: No superficial cervical adenopathy.  Neurological:     General: No focal deficit present.     Mental Status: She is alert and oriented to person, place, and time.  Psychiatric:        Mood and Affect: Mood normal.        Behavior: Behavior normal.        Thought Content: Thought content normal.        Judgment: Judgment normal.      UC Treatments / Results  Labs (all labs ordered are listed, but only abnormal results are displayed) Labs Reviewed - No data to display  EKG   Radiology DG Chest 2 View  Result Date: 08/25/2021 CLINICAL DATA:  Cough, sore throat EXAM: CHEST - 2 VIEW COMPARISON:  08/16/2021 FINDINGS: Transverse diameter of heart is slightly increased. Low position of the diaphragms suggests COPD. Central pulmonary vessels are less prominent. There are no signs of alveolar pulmonary edema or new focal infiltrates. There is no pleural effusion or pneumothorax. IMPRESSION: There are no signs of pulmonary edema or new focal infiltrates. Electronically Signed   By: Elmer Picker M.D.   On: 08/25/2021 14:34    Procedures Procedures (including critical care time)  Medications Ordered in UC Medications - No data to display  Initial Impression / Assessment and Plan / UC Course  I have reviewed the triage vital signs and the nursing notes.  Pertinent labs & imaging results that were available during my care of the patient were reviewed by me and considered in my medical decision making (see chart for details).     This patient is a very pleasant 56 y.o. year old female presenting with congestion and cough. Today this pt is afebrile nontachycardic nontachypneic, oxygenating well on room air, no wheezes rhonchi or rales.   CXR - Low position of the diaphragms suggests COPD.Marland KitchenMarland KitchenThere are no signs of pulmonary edema or new focal infiltrates.  Given minimal improvement with albuterol, flonase, tessalon - will send  augmentin and prednisone today to cover for sinusitis and bronchitis. She is not a diabetic.   ED return precautions discussed. Patient verbalizes understanding and agreement.   Final Clinical Impressions(s) / UC Diagnoses   Final diagnoses:  Sinobronchitis     Discharge Instructions      -Your chest xray looks good today. You don't have pneumonia. We will treat your bronchitis with a steroid for inflammation. Continue the albuterol and flonase if it's helping.  -Start the antibiotic-Augmentin (amoxicillin-clavulanate), 1 pill every 12 hours for 7 days.  You can take this with food like with breakfast and dinner. -Prednisone taper for cough/bronchitis. I recommend taking this in the morning as it could give you energy.  Avoid NSAIDs like ibuprofen and alleve while taking this medication as they can increase your risk of stomach upset and even GI bleeding when in combination with a steroid. You can continue tylenol (acetaminophen) up to '1000mg'$  3x daily.      ED Prescriptions     Medication Sig Dispense Auth. Provider   amoxicillin-clavulanate (AUGMENTIN) 875-125 MG tablet Take 1 tablet by mouth every 12 (twelve) hours. 14 tablet Hazel Sams, PA-C   predniSONE (STERAPRED UNI-PAK 21 TAB) 10 MG (21) TBPK tablet Take by mouth daily. Take 6 tabs by mouth daily  for 2 days, then 5 tabs for 2 days, then 4 tabs for 2 days, then 3 tabs for 2 days, 2 tabs for 2 days, then 1 tab by mouth daily for 2 days 42 tablet Hazel Sams, PA-C      PDMP not reviewed this encounter.   Hazel Sams, PA-C 08/25/21 1530

## 2021-08-29 ENCOUNTER — Other Ambulatory Visit: Payer: Self-pay

## 2021-08-30 ENCOUNTER — Other Ambulatory Visit: Payer: Self-pay

## 2021-08-30 MED ORDER — LEVOTHYROXINE SODIUM 25 MCG PO TABS
ORAL_TABLET | ORAL | 2 refills | Status: DC
  Filled 2021-08-30: qty 45, 90d supply, fill #0
  Filled 2021-11-07: qty 45, 90d supply, fill #1

## 2021-08-31 ENCOUNTER — Other Ambulatory Visit: Payer: Self-pay

## 2021-09-06 ENCOUNTER — Other Ambulatory Visit: Payer: Self-pay

## 2021-09-18 ENCOUNTER — Ambulatory Visit
Admission: EM | Admit: 2021-09-18 | Discharge: 2021-09-18 | Disposition: A | Discharge status: Home / Self Care | Payer: BC Managed Care – PPO | Attending: Internal Medicine | Admitting: Internal Medicine

## 2021-09-18 ENCOUNTER — Encounter: Payer: Self-pay | Admitting: Emergency Medicine

## 2021-09-18 DIAGNOSIS — N3001 Acute cystitis with hematuria: Secondary | ICD-10-CM | POA: Diagnosis not present

## 2021-09-18 DIAGNOSIS — R350 Frequency of micturition: Secondary | ICD-10-CM | POA: Diagnosis not present

## 2021-09-18 DIAGNOSIS — R300 Dysuria: Secondary | ICD-10-CM | POA: Insufficient documentation

## 2021-09-18 LAB — POCT URINALYSIS DIP (MANUAL ENTRY)
Bilirubin, UA: NEGATIVE
Glucose, UA: NEGATIVE mg/dL
Ketones, POC UA: NEGATIVE mg/dL
Nitrite, UA: POSITIVE — AB
Protein Ur, POC: NEGATIVE mg/dL
Spec Grav, UA: >=1.03 — AB (ref 1.010–1.025)
Urobilinogen, UA: 0.2 E.U./dL
pH, UA: 5.5 (ref 5.0–8.0)

## 2021-09-18 MED ORDER — NITROFURANTOIN MONOHYD MACRO 100 MG PO CAPS: 100.0000 mg | ORAL_CAPSULE | Freq: Two times a day (BID) | ORAL | 0 refills | Status: DC

## 2021-09-18 NOTE — Discharge Instructions (Signed)
You have been prescribed antibiotic for urinary tract infection.  Urine culture is pending.  We will call if it is abnormal.  Please follow-up if symptoms persist or worsen.

## 2021-09-18 NOTE — ED Provider Notes (Signed)
[name]    CSN: [id number] Arrival date & time: 09/18/21  1416      History   Chief Complaint Chief Complaint  Patient presents with   Dysuria   Follow-up    HPI Madison Walker is a 56 y.o. female.   Patient presents with 2 different chief complaints today.  Patient reports 2-day history of urinary burning and urinary urgency.  Denies vaginal discharge, abdominal pain, back pain, fever, abnormal vaginal bleeding, hematuria.  Patient also wants to be sure that her bronchitis has cleared up.  Patient was seen on 2 different occurrences for upper respiratory symptoms and cough.  Patient was seen on 08/16/2021 and treated with symptomatic treatment and albuterol inhaler.  She was seen again on 08/28/2021 and prescribed prednisone steroid taper as well as Augmentin antibiotic.  She had chest x-rays completed at these visits that were unremarkable.  Patient reports that cough has pretty much has resolved but is still intermittent at times.  Denies any associated fever, chest pain, shortness of breath.   Dysuria   Past Medical History:  Diagnosis Date   Heart murmur    Hypothyroidism    Varicose veins of bilateral lower extremities with other complications     Patient Active Problem List   Diagnosis Date Noted   Small fiber neuropathy 06/14/2021   Routine health maintenance 05/11/2021   Hashimoto's thyroiditis 12/22/2020   Hypothyroidism 12/22/2020   Hypothyroidism due to Hashimoto's thyroiditis 12/21/2020   Palpitations 12/21/2020   Other constipation 11/18/2020   Nausea without vomiting 11/18/2020   Right bundle branch block 09/30/2018   Family history of heart disease 06/19/2018   Atypical chest pain 11/22/2017   Dermoid cyst of ovary (8 cm) 04/07/2015    Past Surgical History:  Procedure Laterality Date   LAPAROSCOPIC UNILATERAL SALPINGO OOPHERECTOMY Left 05/31/2015   Procedure: LAPAROSCOPIC UNILATERAL SALPINGO OOPHORECTOMY;  Surgeon: Osborne Oman,  MD;  Location: [facility];  Service: Gynecology;  Laterality: Left;   TUBAL LIGATION     UNILATERAL SALPINGECTOMY Right 05/31/2015   Procedure: UNILATERAL SALPINGECTOMY;  Surgeon: Osborne Oman, MD;  Location: [facility];  Service: Gynecology;  Laterality: Right;    OB History     Gravida  2   Para  2   Term  2   Preterm      AB      Living  2      SAB      IAB      Ectopic      Multiple      Live Births               Home Medications    Prior to Admission medications   Medication Sig Start Date End Date Taking? Authorizing Provider  nitrofurantoin, macrocrystal-monohydrate, (MACROBID) 100 MG capsule Take 1 capsule (100 mg total) by mouth 2 (two) times daily. 09/18/21  Yes Otie Headlee, Michele Rockers, FNP  albuterol (VENTOLIN HFA) 108 (90 Base) MCG/ACT inhaler Inhale 1-2 puffs into the lungs every 6 (six) hours as needed for wheezing or shortness of breath. 08/16/21   Teodora Medici, FNP  amoxicillin-clavulanate (AUGMENTIN) 875-125 MG tablet Take 1 tablet by mouth every 12 (twelve) hours. 08/25/21   Hazel Sams, PA-C  aspirin 81 MG chewable tablet 1 tablet    [provider]  aspirin EC 81 MG tablet Take 81 mg by mouth daily. Swallow whole.    [provider]  benzonatate (TESSALON) 100 MG capsule Take 1 capsule (  100 mg total) by mouth every 8 (eight) hours as needed for cough. 08/16/21   Teodora Medici, FNP  clobetasol (TEMOVATE) 0.05 % external solution Apply topically. 12/07/20   [provider]  clobetasol ointment (TEMOVATE) 0.05 % Apply topically a thin layer to affected area twice daily 06/15/21   Marylynn Pearson, MD  fluticasone (CUTIVATE) 0.05 % cream APPLY TO AFFECTED AREA UP TO TWICE A DAY AS NEEDED 11/16/20   [provider]  fluticasone (FLONASE) 50 MCG/ACT nasal spray Place 1 spray into both nostrils daily. 08/16/21   Teodora Medici, FNP  gabapentin (NEURONTIN) 300 MG capsule Take 1 capsule (300 mg total) by mouth at bedtime. 03/09/21    Trula Slade, DPM  Krill Oil 500 MG CAPS See admin instructions.    [provider]  levothyroxine (SYNTHROID) 25 MCG tablet Take by mouth. 12/22/20   [provider]  levothyroxine (SYNTHROID) 25 MCG tablet Take 0.5 tablets (12.5 mcg total) by mouth daily. 08/30/21     mupirocin ointment (BACTROBAN) 2 % Apply 1 application topically 2 (two) times daily. 04/03/21   Trula Slade, DPM  Olopatadine HCl 0.2 % SOLN Apply 1 drop to eye daily. 07/30/20   Vanessa Kick, MD  predniSONE (STERAPRED UNI-PAK 21 TAB) 10 MG (21) TBPK tablet Take by mouth daily. Take 6 tabs by mouth daily  for 2 days, then 5 tabs for 2 days, then 4 tabs for 2 days, then 3 tabs for 2 days, 2 tabs for 2 days, then 1 tab by mouth daily for 2 days 08/25/21   Hazel Sams, PA-C    Family History Family History  Problem Relation Age of Onset   Hypertension Mother    COPD Mother    Hernia Mother    Diabetes Father    COPD Father    Hypertension Sister    Hypertension Brother    Coronary artery disease Brother        MI in his 7's   Diabetes Brother    Diabetes Other    Colon cancer Neg Hx    Colon polyps Neg Hx    Esophageal cancer Neg Hx    Rectal cancer Neg Hx    Stomach cancer Neg Hx    Neuropathy Neg Hx     Social History Social History   Tobacco Use   Smoking status: Never   Smokeless tobacco: Never  Vaping Use   Vaping Use: Never used  Substance Use Topics   Alcohol use: No   Drug use: No     Allergies   Iodine   Review of Systems Review of Systems Per HPI  Physical Exam Triage Vital Signs ED Triage Vitals  Enc Vitals Group     BP 09/18/21 1647 130/81     Pulse Rate 09/18/21 1647 (!) 56     Resp 09/18/21 1647 18     Temp 09/18/21 1647 98 F (36.7 C)     Temp src --      SpO2 09/18/21 1647 98 %     Weight --      Height --      Head Circumference --      Peak Flow --      Pain Score 09/18/21 1646 0     Pain Loc --      Pain Edu? --      Excl. in [place]?  --    No data found.  Updated Vital Signs BP 130/81  Pulse (!) 56   Temp 98 F (36.7 C)   Resp 18   LMP 09/06/2014 (Approximate)   SpO2 98%   Visual Acuity Right Eye Distance:   Left Eye Distance:   Bilateral Distance:    Right Eye Near:   Left Eye Near:    Bilateral Near:     Physical Exam Constitutional:      General: She is not in acute distress.    Appearance: Normal appearance. She is not toxic-appearing or diaphoretic.  HENT:     Head: Normocephalic and atraumatic.     Right Ear: Tympanic membrane and ear canal normal.     Left Ear: Tympanic membrane and ear canal normal.     Nose: Nose normal.     Mouth/Throat:     Mouth: Mucous membranes are moist.     Pharynx: No posterior oropharyngeal erythema.  Eyes:     Extraocular Movements: Extraocular movements intact.     Conjunctiva/sclera: Conjunctivae normal.  Cardiovascular:     Rate and Rhythm: Normal rate and regular rhythm.     Pulses: Normal pulses.     Heart sounds: Normal heart sounds.  Pulmonary:     Effort: Pulmonary effort is normal. No respiratory distress.     Breath sounds: Normal breath sounds. No stridor. No wheezing, rhonchi or rales.  Abdominal:     General: Bowel sounds are normal. There is no distension.     Palpations: Abdomen is soft.     Tenderness: There is no abdominal tenderness.  Neurological:     General: No focal deficit present.     Mental Status: She is alert and oriented to person, place, and time. Mental status is at baseline.  Psychiatric:        Mood and Affect: Mood normal.        Behavior: Behavior normal.        Thought Content: Thought content normal.        Judgment: Judgment normal.      UC Treatments / Results  Labs (all labs ordered are listed, but only abnormal results are displayed) Labs Reviewed  POCT URINALYSIS DIP (MANUAL ENTRY) - Abnormal; Notable for the following components:      Result Value   Clarity, UA cloudy (*)    Spec Grav, UA >=1.030 (*)     Blood, UA trace-intact (*)    Nitrite, UA Positive (*)    Leukocytes, UA Trace (*)    All other components within normal limits  URINE CULTURE    EKG   Radiology No results found.  Procedures Procedures (including critical care time)  Medications Ordered in UC Medications - No data to display  Initial Impression / Assessment and Plan / UC Course  I have reviewed the triage vital signs and the nursing notes.  Pertinent labs & imaging results that were available during my care of the patient were reviewed by me and considered in my medical decision making (see chart for details).     It appears that patient's bronchitis is pretty much cleared.  Lung sounds are clear so do not think that any additional imaging of the chest is necessary.  Patient advised of supportive care and symptom management.  Also given strict return precautions for these symptoms.  Urinalysis indicating urinary tract infection.  Will treat with Macrobid antibiotic.  Urine culture pending.  Patient to follow-up if symptoms persist or worsen.  Patient verbalized understanding and was agreeable with plan. Final Clinical Impressions(s) / UC Diagnoses  Final diagnoses:  Acute cystitis with hematuria  Urinary frequency  Dysuria     Discharge Instructions      You have been prescribed antibiotic for urinary tract infection.  Urine culture is pending.  We will call if it is abnormal.  Please follow-up if symptoms persist or worsen.    ED Prescriptions     Medication Sig Dispense Auth. Provider   nitrofurantoin, macrocrystal-monohydrate, (MACROBID) 100 MG capsule Take 1 capsule (100 mg total) by mouth 2 (two) times daily. 10 capsule Teodora Medici, [place]      PDMP not reviewed this encounter.   Teodora Medici, [place] 09/18/21 [phone]

## 2021-09-18 NOTE — ED Triage Notes (Signed)
Pt is present today with concerns for a UTI. Pt states she is experiencing  dysuria and urgency x2 days.   Pt states that she would also like to f/u on her bronchitis to make sure that she is clear after finishing the antibiotics.

## 2021-09-21 LAB — URINE CULTURE: Culture: >=100000 — AB

## 2021-09-27 ENCOUNTER — Other Ambulatory Visit: Payer: Self-pay

## 2021-09-27 ENCOUNTER — Other Ambulatory Visit: Payer: Self-pay | Admitting: Nurse Practitioner

## 2021-09-27 MED ORDER — CLOBETASOL PROPIONATE 0.05 % EX SOLN
Freq: Two times a day (BID) | CUTANEOUS | 0 refills | Status: DC
  Filled 2021-09-27: qty 50, 25d supply, fill #0

## 2021-10-02 ENCOUNTER — Ambulatory Visit
Admission: EM | Admit: 2021-10-02 | Discharge: 2021-10-02 | Disposition: A | Discharge status: Home / Self Care | Payer: BC Managed Care – PPO | Attending: Internal Medicine | Admitting: Internal Medicine

## 2021-10-02 DIAGNOSIS — R0789 Other chest pain: Secondary | ICD-10-CM

## 2021-10-02 NOTE — ED Triage Notes (Signed)
Pt presents to uc with co of  pleurisy and cp since Saturday. Pt st 1 month ago she was seen for bronchitis,and UTI and is concerned it is lingering, pt still has cough. Pt reports no current uti symptoms

## 2021-10-02 NOTE — ED Provider Notes (Signed)
[name]    CSN: [id number] Arrival date & time: 10/02/21  1426      History   Chief Complaint Chief Complaint  Patient presents with   Pleurisy    HPI Madison Walker is a 56 y.o. female.   Patient presents with left-sided chest pain that started a few days prior and has been intermittent.  She describes it as a tightness and pain in the center of her chest that radiates to the left ribs.  Denies any associated shortness of breath.  Denies headache, blurred vision, dizziness, nausea, vomiting.  Patient denies any history of heart problems.  Patient reports minimal cough since being diagnosed with bronchitis a few months prior but is mainly resolved.  Denies any known fevers.     Past Medical History:  Diagnosis Date   Heart murmur    Hypothyroidism    Varicose veins of bilateral lower extremities with other complications     Patient Active Problem List   Diagnosis Date Noted   Small fiber neuropathy 06/14/2021   Routine health maintenance 05/11/2021   Hashimoto's thyroiditis 12/22/2020   Hypothyroidism 12/22/2020   Hypothyroidism due to Hashimoto's thyroiditis 12/21/2020   Palpitations 12/21/2020   Other constipation 11/18/2020   Nausea without vomiting 11/18/2020   Right bundle branch block 09/30/2018   Family history of heart disease 06/19/2018   Atypical chest pain 11/22/2017   Dermoid cyst of ovary (8 cm) 04/07/2015    Past Surgical History:  Procedure Laterality Date   LAPAROSCOPIC UNILATERAL SALPINGO OOPHERECTOMY Left 05/31/2015   Procedure: LAPAROSCOPIC UNILATERAL SALPINGO OOPHORECTOMY;  Surgeon: Osborne Oman, MD;  Location: [facility];  Service: Gynecology;  Laterality: Left;   TUBAL LIGATION     UNILATERAL SALPINGECTOMY Right 05/31/2015   Procedure: UNILATERAL SALPINGECTOMY;  Surgeon: Osborne Oman, MD;  Location: [facility];  Service: Gynecology;  Laterality: Right;    OB History     Gravida  2   Para  2   Term  2   Preterm       AB      Living  2      SAB      IAB      Ectopic      Multiple      Live Births               Home Medications    Prior to Admission medications   Medication Sig Start Date End Date Taking? Authorizing Provider  albuterol (VENTOLIN HFA) 108 (90 Base) MCG/ACT inhaler Inhale 1-2 puffs into the lungs every 6 (six) hours as needed for wheezing or shortness of breath. 08/16/21   Teodora Medici, FNP  amoxicillin-clavulanate (AUGMENTIN) 875-125 MG tablet Take 1 tablet by mouth every 12 (twelve) hours. 08/25/21   Hazel Sams, PA-C  aspirin 81 MG chewable tablet 1 tablet    [provider]  aspirin EC 81 MG tablet Take 81 mg by mouth daily. Swallow whole.    [provider]  benzonatate (TESSALON) 100 MG capsule Take 1 capsule (100 mg total) by mouth every 8 (eight) hours as needed for cough. 08/16/21   Teodora Medici, FNP  clobetasol (TEMOVATE) 0.05 % external solution Apply topically 2 (two) times daily. 09/27/21   Fenton Foy, NP  clobetasol ointment (TEMOVATE) 0.05 % Apply topically a thin layer to affected area twice daily 06/15/21   Marylynn Pearson, MD  fluticasone (CUTIVATE) 0.05 % cream APPLY TO AFFECTED AREA UP TO TWICE  A DAY AS NEEDED 11/16/20   [provider]  fluticasone (FLONASE) 50 MCG/ACT nasal spray Place 1 spray into both nostrils daily. 08/16/21   Teodora Medici, FNP  gabapentin (NEURONTIN) 300 MG capsule Take 1 capsule (300 mg total) by mouth at bedtime. 03/09/21   Trula Slade, DPM  Krill Oil 500 MG CAPS See admin instructions.    [provider]  levothyroxine (SYNTHROID) 25 MCG tablet Take by mouth. 12/22/20   [provider]  levothyroxine (SYNTHROID) 25 MCG tablet Take 0.5 tablets (12.5 mcg total) by mouth daily. 08/30/21     mupirocin ointment (BACTROBAN) 2 % Apply 1 application topically 2 (two) times daily. 04/03/21   Trula Slade, DPM  nitrofurantoin, macrocrystal-monohydrate, (MACROBID) 100 MG capsule  Take 1 capsule (100 mg total) by mouth 2 (two) times daily. 09/18/21   Teodora Medici, FNP  Olopatadine HCl 0.2 % SOLN Apply 1 drop to eye daily. 07/30/20   Vanessa Kick, MD  predniSONE (STERAPRED UNI-PAK 21 TAB) 10 MG (21) TBPK tablet Take by mouth daily. Take 6 tabs by mouth daily  for 2 days, then 5 tabs for 2 days, then 4 tabs for 2 days, then 3 tabs for 2 days, 2 tabs for 2 days, then 1 tab by mouth daily for 2 days 08/25/21   Hazel Sams, PA-C    Family History Family History  Problem Relation Age of Onset   Hypertension Mother    COPD Mother    Hernia Mother    Diabetes Father    COPD Father    Hypertension Sister    Hypertension Brother    Coronary artery disease Brother        MI in his 67's   Diabetes Brother    Diabetes Other    Colon cancer Neg Hx    Colon polyps Neg Hx    Esophageal cancer Neg Hx    Rectal cancer Neg Hx    Stomach cancer Neg Hx    Neuropathy Neg Hx     Social History Social History   Tobacco Use   Smoking status: Never   Smokeless tobacco: Never  Vaping Use   Vaping Use: Never used  Substance Use Topics   Alcohol use: No   Drug use: No     Allergies   Iodine   Review of Systems Review of Systems Per HPI  Physical Exam Triage Vital Signs ED Triage Vitals  Enc Vitals Group     BP 10/02/21 1451 (!) 143/75     Pulse Rate 10/02/21 1451 61     Resp 10/02/21 1451 20     Temp 10/02/21 1451 98 F (36.7 C)     Temp src --      SpO2 10/02/21 1451 98 %     Weight --      Height --      Head Circumference --      Peak Flow --      Pain Score 10/02/21 1454 0     Pain Loc --      Pain Edu? --      Excl. in [place]? --    No data found.  Updated Vital Signs BP (!) 143/75   Pulse 61   Temp 98 F (36.7 C)   Resp 20   LMP 09/06/2014 (Approximate)   SpO2 98%   Visual Acuity Right Eye Distance:   Left Eye Distance:   Bilateral Distance:    Right Eye Near:  Left Eye Near:    Bilateral Near:     Physical  Exam Constitutional:      General: She is not in acute distress.    Appearance: Normal appearance. She is not toxic-appearing or diaphoretic.  HENT:     Head: Normocephalic and atraumatic.  Eyes:     Extraocular Movements: Extraocular movements intact.     Conjunctiva/sclera: Conjunctivae normal.  Cardiovascular:     Rate and Rhythm: Normal rate and regular rhythm.     Pulses: Normal pulses.     Heart sounds: Normal heart sounds.  Pulmonary:     Effort: Pulmonary effort is normal. No respiratory distress.     Breath sounds: Normal breath sounds.  Neurological:     General: No focal deficit present.     Mental Status: She is alert and oriented to person, place, and time. Mental status is at baseline.  Psychiatric:        Mood and Affect: Mood normal.        Behavior: Behavior normal.        Thought Content: Thought content normal.        Judgment: Judgment normal.      UC Treatments / Results  Labs (all labs ordered are listed, but only abnormal results are displayed) Labs Reviewed - No data to display  EKG   Radiology No results found.  Procedures Procedures (including critical care time)  Medications Ordered in UC Medications - No data to display  Initial Impression / Assessment and Plan / UC Course  I have reviewed the triage vital signs and the nursing notes.  Pertinent labs & imaging results that were available during my care of the patient were reviewed by me and considered in my medical decision making (see chart for details).     EKG unremarkable when compared to previous EKGs.  Given patient is currently having chest pain and having limited resources here in urgent care for evaluation of cardiac etiology, advised patient that she will need to go to the hospital for further evaluation and management.  Patient was agreeable with plan.  Suggested EMS transport given chest pain but patient declined.  Risks associated with not going by EMS were discussed with  patient.  Patient voiced understanding and wished to self transport. Final Clinical Impressions(s) / UC Diagnoses   Final diagnoses:  Other chest pain     Discharge Instructions      Please go to the emergency department as soon as you leave urgent care for further evaluation and management.    ED Prescriptions   None    PDMP not reviewed this encounter.   Teodora Medici,  10/02/21 [phone]

## 2021-10-02 NOTE — Discharge Instructions (Signed)
Please go to the emergency department as soon as you leave urgent care for further evaluation and management. ?

## 2021-10-03 ENCOUNTER — Encounter (HOSPITAL_COMMUNITY): Payer: Self-pay

## 2021-10-03 ENCOUNTER — Other Ambulatory Visit: Payer: Self-pay

## 2021-10-03 ENCOUNTER — Emergency Department (HOSPITAL_COMMUNITY): Payer: BC Managed Care – PPO

## 2021-10-03 ENCOUNTER — Emergency Department (HOSPITAL_COMMUNITY)
Admission: EM | Admit: 2021-10-03 | Discharge: 2021-10-03 | Disposition: A | Discharge status: Home / Self Care | Payer: BC Managed Care – PPO | Attending: Emergency Medicine | Admitting: Emergency Medicine

## 2021-10-03 DIAGNOSIS — Z7982 Long term (current) use of aspirin: Secondary | ICD-10-CM | POA: Diagnosis not present

## 2021-10-03 DIAGNOSIS — R0789 Other chest pain: Secondary | ICD-10-CM | POA: Insufficient documentation

## 2021-10-03 LAB — BASIC METABOLIC PANEL
Anion gap: 5 (ref 5–15)
BUN: 16 mg/dL (ref 6–20)
CO2: 26 mmol/L (ref 22–32)
Calcium: 8.9 mg/dL (ref 8.9–10.3)
Chloride: 109 mmol/L (ref 98–111)
Creatinine, Ser: 0.72 mg/dL (ref 0.44–1.00)
GFR, Estimated: >60 mL/min (ref >60)
Glucose, Bld: 107 mg/dL — ABNORMAL HIGH (ref 70–99)
Potassium: 4.1 mmol/L (ref 3.5–5.1)
Sodium: 140 mmol/L (ref 135–145)

## 2021-10-03 LAB — I-STAT BETA HCG BLOOD, ED (MC, WL, AP ONLY): I-stat hCG, quantitative: <5 m[IU]/mL (ref <5)

## 2021-10-03 LAB — CBC
HCT: 39.3 % (ref 36.0–46.0)
Hemoglobin: 13.1 g/dL (ref 12.0–15.0)
MCH: 33.9 pg (ref 26.0–34.0)
MCHC: 33.3 g/dL (ref 30.0–36.0)
MCV: 101.8 fL — ABNORMAL HIGH (ref 80.0–100.0)
Platelets: 182 10*3/uL (ref 150–400)
RBC: 3.86 MIL/uL — ABNORMAL LOW (ref 3.87–5.11)
RDW: 13.2 % (ref 11.5–15.5)
WBC: 5.6 10*3/uL (ref 4.0–10.5)
nRBC: 0 % (ref 0.0–0.2)

## 2021-10-03 LAB — TROPONIN I (HIGH SENSITIVITY): Troponin I (High Sensitivity): 2 ng/L (ref <18)

## 2021-10-03 NOTE — ED Provider Notes (Signed)
[facility] DEPT Provider Note   CSN: [id number] Arrival date & time: 10/03/21  1133     History  Chief Complaint  Patient presents with   Chest Pain    Madison Walker is a 56 y.o. female.  56 year old female with prior medical history as detailed below presents for evaluation.  Patient complains of intermittent left-sided chest tightness.  Symptoms began 2 to 3 days prior.  Pain is intermittent.  Pain lasts several seconds at a time.  She denies associated shortness of breath, congestion, fever.  Patient reports that she was diagnosed with bronchitis and treated for same approximately 2 weeks ago.  She reports that she finished a course of antibiotics and steroids approximately 1 week ago.  She feels that perhaps her bronchitis and intermittent coughing may have led to her having the chest discomfort.  She did not report to this provider that she was seen in urgent care yesterday for same complaint and advised to come to the ED for evaluation of her chest pain.  The history is provided by the patient and medical records.  Chest Pain Pain location:  L lateral chest Pain quality: sharp   Pain radiates to:  Does not radiate Pain severity:  No pain Onset quality:  Sudden Duration:  3 days Timing:  Intermittent      Home Medications Prior to Admission medications   Medication Sig Start Date End Date Taking? Authorizing Provider  albuterol (VENTOLIN HFA) 108 (90 Base) MCG/ACT inhaler Inhale 1-2 puffs into the lungs every 6 (six) hours as needed for wheezing or shortness of breath. 08/16/21   Teodora Medici, FNP  amoxicillin-clavulanate (AUGMENTIN) 875-125 MG tablet Take 1 tablet by mouth every 12 (twelve) hours. 08/25/21   Hazel Sams, PA-C  aspirin 81 MG chewable tablet 1 tablet    [provider]  aspirin EC 81 MG tablet Take 81 mg by mouth daily. Swallow whole.    [provider]  benzonatate (TESSALON) 100 MG capsule Take 1  capsule (100 mg total) by mouth every 8 (eight) hours as needed for cough. 08/16/21   Teodora Medici, FNP  clobetasol (TEMOVATE) 0.05 % external solution Apply topically 2 (two) times daily. 09/27/21   Fenton Foy, NP  clobetasol ointment (TEMOVATE) 0.05 % Apply topically a thin layer to affected area twice daily 06/15/21   Marylynn Pearson, MD  fluticasone (CUTIVATE) 0.05 % cream APPLY TO AFFECTED AREA UP TO TWICE A DAY AS NEEDED 11/16/20   [provider]  fluticasone (FLONASE) 50 MCG/ACT nasal spray Place 1 spray into both nostrils daily. 08/16/21   Teodora Medici, FNP  gabapentin (NEURONTIN) 300 MG capsule Take 1 capsule (300 mg total) by mouth at bedtime. 03/09/21   Trula Slade, DPM  Krill Oil 500 MG CAPS See admin instructions.    [provider]  levothyroxine (SYNTHROID) 25 MCG tablet Take by mouth. 12/22/20   [provider]  levothyroxine (SYNTHROID) 25 MCG tablet Take 0.5 tablets (12.5 mcg total) by mouth daily. 08/30/21     mupirocin ointment (BACTROBAN) 2 % Apply 1 application topically 2 (two) times daily. 04/03/21   Trula Slade, DPM  nitrofurantoin, macrocrystal-monohydrate, (MACROBID) 100 MG capsule Take 1 capsule (100 mg total) by mouth 2 (two) times daily. 09/18/21   Teodora Medici, FNP  Olopatadine HCl 0.2 % SOLN Apply 1 drop to eye daily. 07/30/20   Vanessa Kick, MD  predniSONE (STERAPRED UNI-PAK 21 TAB) 10 MG (21)  TBPK tablet Take by mouth daily. Take 6 tabs by mouth daily  for 2 days, then 5 tabs for 2 days, then 4 tabs for 2 days, then 3 tabs for 2 days, 2 tabs for 2 days, then 1 tab by mouth daily for 2 days 08/25/21   Hazel Sams, PA-C      Allergies    Iodine    Review of Systems   Review of Systems  Cardiovascular:  Positive for chest pain.  All other systems reviewed and are negative.   Physical Exam Updated Vital Signs BP (!) 140/66 (BP Location: Left Arm)   Pulse 80   Temp 98.3 F (36.8 C) (Oral)   Resp 20   Ht '5\' 8"'$   (1.727 m)   Wt 90.7 kg   LMP 09/06/2014 (Approximate)   SpO2 100%   BMI 30.41 kg/m  Physical Exam Vitals and nursing note reviewed.  Constitutional:      General: She is not in acute distress.    Appearance: Normal appearance. She is well-developed.  HENT:     Head: Normocephalic and atraumatic.  Eyes:     Conjunctiva/sclera: Conjunctivae normal.     Pupils: Pupils are equal, round, and reactive to light.  Cardiovascular:     Rate and Rhythm: Normal rate and regular rhythm.     Heart sounds: Normal heart sounds.  Pulmonary:     Effort: Pulmonary effort is normal. No respiratory distress.     Breath sounds: Normal breath sounds.  Abdominal:     General: There is no distension.     Palpations: Abdomen is soft.     Tenderness: There is no abdominal tenderness.  Musculoskeletal:        General: No deformity. Normal range of motion.     Cervical back: Normal range of motion and neck supple.  Skin:    General: Skin is warm and dry.  Neurological:     General: No focal deficit present.     Mental Status: She is alert and oriented to person, place, and time.     ED Results / Procedures / Treatments   Labs (all labs ordered are listed, but only abnormal results are displayed) Labs Reviewed  BASIC METABOLIC PANEL - Abnormal; Notable for the following components:      Result Value   Glucose, Bld 107 (*)    All other components within normal limits  CBC - Abnormal; Notable for the following components:   RBC 3.86 (*)    MCV 101.8 (*)    All other components within normal limits  I-STAT BETA HCG BLOOD, ED ([facility], [facility], [facility] ONLY)  TROPONIN I (HIGH SENSITIVITY)  TROPONIN I (HIGH SENSITIVITY)    EKG EKG Interpretation  Date/Time:  Tuesday October 03 2021 11:41:31 EDT Ventricular Rate:  75 PR Interval:  211 QRS Duration: 140 QT Interval:  421 QTC Calculation: 471 R Axis:   78 Text Interpretation: Sinus rhythm Prolonged PR interval IVCD, consider atypical RBBB Confirmed by  Dene Gentry [id number]) on 10/03/2021 12:02:57 PM  Radiology DG Chest 2 View  Result Date: 10/03/2021 CLINICAL DATA:  chest pain; intermittent left-sided chest tightness since Saturday EXAM: CHEST - 2 VIEW COMPARISON:  August 25, 2021 FINDINGS: Mild cardiomegaly. No consolidation, pleural effusion or significant vascular congestion. Prominent bronchovascular markings with prominent pulmonary vasculature at the hila, stable. Bony thorax is unremarkable. IMPRESSION: No active cardiopulmonary disease.  Mild cardiomegaly, stable. Electronically Signed   By: Frazier Richards M.D.   On: 10/03/2021 12:15  Procedures Procedures    Medications Ordered in ED Medications - No data to display  ED Course/ Medical Decision Making/ A&P                           Medical Decision Making Amount and/or Complexity of Data Reviewed Labs: ordered. Radiology: ordered.    Medical Screen Complete  This patient presented to the ED with complaint of chest pain.  This complaint involves an extensive number of treatment options. The initial differential diagnosis includes, but is not limited to, ACS, pneumonia, pulmonary infection, metabolic abnormality, etc.  This presentation is: Acute, Self-Limited, Previously Undiagnosed, Uncertain Prognosis, Complicated, Systemic Symptoms, and Threat to Life/Bodily Function  Patient is presenting with atypical chest discomfort.  Describe symptoms are most consistent with likely muscular strain.  Patient's exam is without evidence of significant acute pathology.  Screening labs obtained including troponin and EKG and chest x-ray are without significant abnormality.  Patient is improved and reassured by her work-up and findings.  Patient does understand need for close outpatient follow-up.  Strict return precautions given and understood.   Additional history obtained:  External records from outside sources obtained and reviewed including prior ED visits and prior  Inpatient records.    Lab Tests:  I ordered and personally interpreted labs.  The pertinent results include:   CBC, BMP, troponin  Imaging Studies ordered:  I ordered imaging studies including chest x-ray I independently visualized and interpreted obtained imaging which showed NAD I agree with the radiologist interpretation.   Cardiac Monitoring:  The patient was maintained on a cardiac monitor.  I personally viewed and interpreted the cardiac monitor which showed an underlying rhythm of: NSR   Problem List / ED Course:  Atypical chest pain   Reevaluation:  After the interventions noted above, I reevaluated the patient and found that they have: improved   Disposition:  After consideration of the diagnostic results and the patients response to treatment, I feel that the patent would benefit from close outpatient follow-up.          Final Clinical Impression(s) / ED Diagnoses Final diagnoses:  Atypical chest pain    Rx / DC Orders ED Discharge Orders     None         Valarie Merino, MD 10/03/21 1420

## 2021-10-03 NOTE — ED Triage Notes (Addendum)
Pt reports intermittent chest tightness since Saturday. Does report a lingering cough, recent bronchitis. Pt denies sob or other associated symptoms. Denies chest tightness at this moment.

## 2021-10-03 NOTE — Discharge Instructions (Signed)
Return for any problem.  ?

## 2021-11-03 DIAGNOSIS — Z79890 Hormone replacement therapy (postmenopausal): Secondary | ICD-10-CM | POA: Diagnosis not present

## 2021-11-03 DIAGNOSIS — E038 Other specified hypothyroidism: Secondary | ICD-10-CM | POA: Diagnosis not present

## 2021-11-03 DIAGNOSIS — E063 Autoimmune thyroiditis: Secondary | ICD-10-CM | POA: Diagnosis not present

## 2021-11-07 ENCOUNTER — Other Ambulatory Visit: Payer: Self-pay

## 2021-11-08 ENCOUNTER — Encounter: Payer: Self-pay | Admitting: Nurse Practitioner

## 2021-11-08 ENCOUNTER — Ambulatory Visit: Payer: BC Managed Care – PPO | Admitting: Nurse Practitioner

## 2021-11-08 ENCOUNTER — Other Ambulatory Visit: Payer: Self-pay

## 2021-11-08 VITALS — BP 118/80 | HR 66 | Temp 98.2°F | Ht 67.0 in | Wt 202.8 lb

## 2021-11-08 DIAGNOSIS — Z0000 Encounter for general adult medical examination without abnormal findings: Secondary | ICD-10-CM | POA: Diagnosis not present

## 2021-11-08 NOTE — Patient Instructions (Signed)
1. Routine health maintenance  - CBC - Comprehensive metabolic panel   Follow up:  Follow up in 6 months or sooner if needed

## 2021-11-08 NOTE — Progress Notes (Addendum)
$'@Patient'j$  ID: Madison Walker, female    DOB: January 27, 1966, 56 y.o.   MRN: [id number]  Chief Complaint  Patient presents with   Annual Exam    Pt is here for 6 month's  physical visit    Referring provider: Fenton Foy, NP   HPI  56 year old female with history of hypothyroidism, Hashimoto's thyroiditis, chest pain, small fiber neuropathy, heart murmur.  Is followed by cardiology, also followed by endocrinology for thyroid disease.  Overall patient has been doing well since her last visit here.  We will check blood work today. Denies f/c/s, n/v/d, hemoptysis, PND, leg swelling Denies chest pain or edema     Allergies  Allergen Reactions   Iodine Itching, Rash and Other (See Comments)    Caused rash/hives after a surgery    Immunization History  Administered Date(s) Administered   Influenza Whole 01/16/2018   Influenza,inj,Quad PF,6+ Mos 12/13/2016, 12/30/2019   Influenza-Unspecified 12/23/2019   PFIZER(Purple Top)SARS-COV-2 Vaccination 09/12/2019, 10/03/2019   Tdap 06/28/2004    Past Medical History:  Diagnosis Date   Heart murmur    Hypothyroidism    Varicose veins of bilateral lower extremities with other complications     Tobacco History: Social History   Tobacco Use  Smoking Status Never  Smokeless Tobacco Never   Counseling given: Not Answered   Outpatient Encounter Medications as of 11/08/2021  Medication Sig   albuterol (VENTOLIN HFA) 108 (90 Base) MCG/ACT inhaler Inhale 1-2 puffs into the lungs every 6 (six) hours as needed for wheezing or shortness of breath.   aspirin 81 MG chewable tablet 1 tablet   aspirin EC 81 MG tablet Take 81 mg by mouth daily. Swallow whole.   benzonatate (TESSALON) 100 MG capsule Take 1 capsule (100 mg total) by mouth every 8 (eight) hours as needed for cough.   clobetasol (TEMOVATE) 0.05 % external solution Apply topically 2 (two) times daily.   clobetasol ointment (TEMOVATE) 0.05 % Apply topically a thin layer to affected  area twice daily   fluticasone (FLONASE) 50 MCG/ACT nasal spray Place 1 spray into both nostrils daily.   Krill Oil 500 MG CAPS See admin instructions.   levothyroxine (SYNTHROID) 25 MCG tablet Take by mouth.   levothyroxine (SYNTHROID) 25 MCG tablet Take 0.5 tablets (12.5 mcg total) by mouth daily.   mupirocin ointment (BACTROBAN) 2 % Apply 1 application topically 2 (two) times daily.   amoxicillin-clavulanate (AUGMENTIN) 875-125 MG tablet Take 1 tablet by mouth every 12 (twelve) hours. (Patient not taking: Reported on 11/08/2021)   fluticasone (CUTIVATE) 0.05 % cream APPLY TO AFFECTED AREA UP TO TWICE A DAY AS NEEDED (Patient not taking: Reported on 11/08/2021)   nitrofurantoin, macrocrystal-monohydrate, (MACROBID) 100 MG capsule Take 1 capsule (100 mg total) by mouth 2 (two) times daily. (Patient not taking: Reported on 11/08/2021)   Olopatadine HCl 0.2 % SOLN Apply 1 drop to eye daily. (Patient not taking: Reported on 11/08/2021)   predniSONE (STERAPRED UNI-PAK 21 TAB) 10 MG (21) TBPK tablet Take by mouth daily. Take 6 tabs by mouth daily  for 2 days, then 5 tabs for 2 days, then 4 tabs for 2 days, then 3 tabs for 2 days, 2 tabs for 2 days, then 1 tab by mouth daily for 2 days (Patient not taking: Reported on 11/08/2021)   [DISCONTINUED] gabapentin (NEURONTIN) 300 MG capsule Take 1 capsule (300 mg total) by mouth at bedtime.   No facility-administered encounter medications on file as of 11/08/2021.     Review of  Systems  Review of Systems  Constitutional: Negative.   HENT: Negative.    Cardiovascular: Negative.   Gastrointestinal: Negative.   Allergic/Immunologic: Negative.   Neurological: Negative.   Psychiatric/Behavioral: Negative.         Physical Exam  BP 118/80 (BP Location: Left Arm, Patient Position: Sitting, Cuff Size: Normal)   Pulse 66   Temp 98.2 F (36.8 C)   Ht '5\' 7"'$  (1.702 m)   Wt 202 lb 12.8 oz (92 kg)   LMP 09/06/2014 (Approximate)   SpO2 100%   BMI 31.76 kg/m    Wt Readings from Last 5 Encounters:  11/08/21 202 lb 12.8 oz (92 kg)  10/03/21 200 lb (90.7 kg)  06/08/21 204 lb 3.2 oz (92.6 kg)  12/07/20 192 lb 12.8 oz (87.5 kg)  11/18/20 194 lb (88 kg)     Physical Exam Vitals and nursing note reviewed.  Constitutional:      General: She is not in acute distress.    Appearance: She is well-developed.  Cardiovascular:     Rate and Rhythm: Normal rate and regular rhythm.  Pulmonary:     Effort: Pulmonary effort is normal.     Breath sounds: Normal breath sounds.  Neurological:     Mental Status: She is alert and oriented to person, place, and time.      Assessment & Plan:   Routine health maintenance - CBC - Comprehensive metabolic panel   Follow up:  Follow up in 6 months or sooner if needed     Fenton Foy, NP 11/08/2021

## 2021-11-08 NOTE — Assessment & Plan Note (Signed)
-   CBC - Comprehensive metabolic panel   Follow up:  Follow up in 6 months or sooner if needed

## 2021-11-09 ENCOUNTER — Other Ambulatory Visit: Payer: Self-pay

## 2021-11-09 LAB — COMPREHENSIVE METABOLIC PANEL
ALT: 22 IU/L (ref 0–32)
AST: 24 IU/L (ref 0–40)
Albumin/Globulin Ratio: 2 (ref 1.2–2.2)
Albumin: 4.5 g/dL (ref 3.8–4.9)
Alkaline Phosphatase: 107 IU/L (ref 44–121)
BUN/Creatinine Ratio: 20 (ref 9–23)
BUN: 16 mg/dL (ref 6–24)
Bilirubin Total: 0.4 mg/dL (ref 0.0–1.2)
CO2: 24 mmol/L (ref 20–29)
Calcium: 9.3 mg/dL (ref 8.7–10.2)
Chloride: 104 mmol/L (ref 96–106)
Creatinine, Ser: 0.79 mg/dL (ref 0.57–1.00)
Globulin, Total: 2.2 g/dL (ref 1.5–4.5)
Glucose: 101 mg/dL — ABNORMAL HIGH (ref 70–99)
Potassium: 4.3 mmol/L (ref 3.5–5.2)
Sodium: 143 mmol/L (ref 134–144)
Total Protein: 6.7 g/dL (ref 6.0–8.5)
eGFR: 87 mL/min/{1.73_m2} (ref >59)

## 2021-11-09 LAB — CBC
Hematocrit: 37.1 % (ref 34.0–46.6)
Hemoglobin: 12.4 g/dL (ref 11.1–15.9)
MCH: 33.2 pg — ABNORMAL HIGH (ref 26.6–33.0)
MCHC: 33.4 g/dL (ref 31.5–35.7)
MCV: 99 fL — ABNORMAL HIGH (ref 79–97)
Platelets: 198 10*3/uL (ref 150–450)
RBC: 3.74 x10E6/uL — ABNORMAL LOW (ref 3.77–5.28)
RDW: 11.9 % (ref 11.7–15.4)
WBC: 5.5 10*3/uL (ref 3.4–10.8)

## 2021-11-09 MED ORDER — SYNTHROID 25 MCG PO TABS
25.0000 ug | ORAL_TABLET | Freq: Every day | ORAL | 2 refills | Status: DC
  Filled 2021-12-12: qty 30, 30d supply, fill #0
  Filled 2022-01-25: qty 30, 30d supply, fill #1

## 2021-12-07 ENCOUNTER — Ambulatory Visit: Payer: BC Managed Care – PPO | Admitting: Neurology

## 2021-12-07 ENCOUNTER — Telehealth: Payer: Self-pay | Admitting: Neurology

## 2021-12-07 NOTE — Telephone Encounter (Signed)
Pt cancelled appt due to work schedule conflict.

## 2021-12-12 ENCOUNTER — Other Ambulatory Visit: Payer: Self-pay

## 2021-12-20 ENCOUNTER — Ambulatory Visit
Admission: EM | Admit: 2021-12-20 | Discharge: 2021-12-20 | Disposition: A | Discharge status: Home / Self Care | Payer: BC Managed Care – PPO | Attending: Internal Medicine | Admitting: Internal Medicine

## 2021-12-20 DIAGNOSIS — R300 Dysuria: Secondary | ICD-10-CM | POA: Insufficient documentation

## 2021-12-20 DIAGNOSIS — N3001 Acute cystitis with hematuria: Secondary | ICD-10-CM

## 2021-12-20 DIAGNOSIS — R350 Frequency of micturition: Secondary | ICD-10-CM | POA: Diagnosis not present

## 2021-12-20 LAB — POCT URINALYSIS DIP (MANUAL ENTRY)
Bilirubin, UA: NEGATIVE
Glucose, UA: NEGATIVE mg/dL
Ketones, POC UA: NEGATIVE mg/dL
Nitrite, UA: NEGATIVE
Spec Grav, UA: >=1.03 — AB (ref 1.010–1.025)
Urobilinogen, UA: 0.2 E.U./dL
pH, UA: 6.5 (ref 5.0–8.0)

## 2021-12-20 MED ORDER — NITROFURANTOIN MONOHYD MACRO 100 MG PO CAPS: 100.0000 mg | ORAL_CAPSULE | Freq: Two times a day (BID) | ORAL | 0 refills | Status: DC

## 2021-12-20 NOTE — Discharge Instructions (Signed)
You have a urinary tract infection which is being treated with antibiotic.  Urine culture is pending.  We will call if it is abnormal.  Follow-up if symptoms persist or worsen.

## 2021-12-20 NOTE — ED Provider Notes (Signed)
[facility]    CSN: [id number] Arrival date & time: 12/20/21  1730      History   Chief Complaint Chief Complaint  Patient presents with   Dysuria    HPI Madison Walker is a 56 y.o. female.   Patient presents with urinary burning, urinary frequency, right lower to mid back pain that started about 2 days ago.  Patient denies vaginal discharge, hematuria, abdominal pain, fever.   Dysuria   Past Medical History:  Diagnosis Date   Heart murmur    Hypothyroidism    Varicose veins of bilateral lower extremities with other complications     Patient Active Problem List   Diagnosis Date Noted   Small fiber neuropathy 06/14/2021   Routine health maintenance 05/11/2021   Hashimoto's thyroiditis 12/22/2020   Hypothyroidism 12/22/2020   Hypothyroidism due to Hashimoto's thyroiditis 12/21/2020   Palpitations 12/21/2020   Other constipation 11/18/2020   Nausea without vomiting 11/18/2020   Right bundle branch block 09/30/2018   Family history of heart disease 06/19/2018   Atypical chest pain 11/22/2017   Dermoid cyst of ovary (8 cm) 04/07/2015    Past Surgical History:  Procedure Laterality Date   LAPAROSCOPIC UNILATERAL SALPINGO OOPHERECTOMY Left 05/31/2015   Procedure: LAPAROSCOPIC UNILATERAL SALPINGO OOPHORECTOMY;  Surgeon: Osborne Oman, MD;  Location: [facility];  Service: Gynecology;  Laterality: Left;   TUBAL LIGATION     UNILATERAL SALPINGECTOMY Right 05/31/2015   Procedure: UNILATERAL SALPINGECTOMY;  Surgeon: Osborne Oman, MD;  Location: [facility];  Service: Gynecology;  Laterality: Right;    OB History     Gravida  2   Para  2   Term  2   Preterm      AB      Living  2      SAB      IAB      Ectopic      Multiple      Live Births               Home Medications    Prior to Admission medications   Medication Sig Start Date End Date Taking? Authorizing Provider  nitrofurantoin, macrocrystal-monohydrate, (MACROBID) 100 MG  capsule Take 1 capsule (100 mg total) by mouth 2 (two) times daily. 12/20/21  Yes Tiffiany Beadles, Michele Rockers, FNP  albuterol (VENTOLIN HFA) 108 (90 Base) MCG/ACT inhaler Inhale 1-2 puffs into the lungs every 6 (six) hours as needed for wheezing or shortness of breath. 08/16/21   Teodora Medici, FNP  amoxicillin-clavulanate (AUGMENTIN) 875-125 MG tablet Take 1 tablet by mouth every 12 (twelve) hours. Patient not taking: Reported on 11/08/2021 08/25/21   Hazel Sams, PA-C  aspirin 81 MG chewable tablet 1 tablet    [provider]  aspirin EC 81 MG tablet Take 81 mg by mouth daily. Swallow whole.    [provider]  benzonatate (TESSALON) 100 MG capsule Take 1 capsule (100 mg total) by mouth every 8 (eight) hours as needed for cough. 08/16/21   Teodora Medici, FNP  clobetasol (TEMOVATE) 0.05 % external solution Apply topically 2 (two) times daily. 09/27/21   Fenton Foy, NP  clobetasol ointment (TEMOVATE) 0.05 % Apply topically a thin layer to affected area twice daily 06/15/21   Marylynn Pearson, MD  fluticasone (CUTIVATE) 0.05 % cream APPLY TO AFFECTED AREA UP TO TWICE A DAY AS NEEDED Patient not taking: Reported on 11/08/2021 11/16/20   [provider]  fluticasone (FLONASE) 50 MCG/ACT nasal  spray Place 1 spray into both nostrils daily. 08/16/21   Teodora Medici, FNP  Krill Oil 500 MG CAPS See admin instructions.    [provider]  levothyroxine (SYNTHROID) 25 MCG tablet Take by mouth. 12/22/20   [provider]  levothyroxine (SYNTHROID) 25 MCG tablet Take 0.5 tablets (12.5 mcg total) by mouth daily. 08/30/21     mupirocin ointment (BACTROBAN) 2 % Apply 1 application topically 2 (two) times daily. 04/03/21   Trula Slade, DPM  Olopatadine HCl 0.2 % SOLN Apply 1 drop to eye daily. Patient not taking: Reported on 11/08/2021 07/30/20   Vanessa Kick, MD  predniSONE (STERAPRED UNI-PAK 21 TAB) 10 MG (21) TBPK tablet Take by mouth daily. Take 6 tabs by mouth daily  for 2  days, then 5 tabs for 2 days, then 4 tabs for 2 days, then 3 tabs for 2 days, 2 tabs for 2 days, then 1 tab by mouth daily for 2 days Patient not taking: Reported on 11/08/2021 08/25/21   Hazel Sams, PA-C  SYNTHROID 25 MCG tablet Take 1 tablet (25 mcg total) by mouth daily at 6 (six) AM. 11/09/21       Family History Family History  Problem Relation Age of Onset   Hypertension Mother    COPD Mother    Hernia Mother    Diabetes Father    COPD Father    Hypertension Sister    Hypertension Brother    Coronary artery disease Brother        MI in his 42's   Diabetes Brother    Diabetes Other    Colon cancer Neg Hx    Colon polyps Neg Hx    Esophageal cancer Neg Hx    Rectal cancer Neg Hx    Stomach cancer Neg Hx    Neuropathy Neg Hx     Social History Social History   Tobacco Use   Smoking status: Never   Smokeless tobacco: Never  Vaping Use   Vaping Use: Never used  Substance Use Topics   Alcohol use: No   Drug use: No     Allergies   Iodine   Review of Systems Review of Systems Per HPI  Physical Exam Triage Vital Signs ED Triage Vitals [12/20/21 1841]  Enc Vitals Group     BP 134/83     Pulse Rate 65     Resp 16     Temp 98.3 F (36.8 C)     Temp Source Oral     SpO2 98 %     Weight      Height      Head Circumference      Peak Flow      Pain Score 3     Pain Loc      Pain Edu?      Excl. in [place]?    No data found.  Updated Vital Signs BP 134/83 (BP Location: Left Arm)   Pulse 65   Temp 98.3 F (36.8 C) (Oral)   Resp 16   LMP 09/06/2014 (Approximate)   SpO2 98%   Visual Acuity Right Eye Distance:   Left Eye Distance:   Bilateral Distance:    Right Eye Near:   Left Eye Near:    Bilateral Near:     Physical Exam Constitutional:      General: She is not in acute distress.    Appearance: Normal appearance. She is not toxic-appearing or diaphoretic.  HENT:  Head: Normocephalic and atraumatic.  Eyes:     Extraocular Movements:  Extraocular movements intact.     Conjunctiva/sclera: Conjunctivae normal.  Cardiovascular:     Rate and Rhythm: Normal rate and regular rhythm.     Pulses: Normal pulses.     Heart sounds: Normal heart sounds.  Pulmonary:     Effort: Pulmonary effort is normal. No respiratory distress.     Breath sounds: Normal breath sounds.  Abdominal:     General: Bowel sounds are normal. There is no distension.     Palpations: Abdomen is soft.     Tenderness: There is no abdominal tenderness.  Neurological:     General: No focal deficit present.     Mental Status: She is alert and oriented to person, place, and time. Mental status is at baseline.  Psychiatric:        Mood and Affect: Mood normal.        Behavior: Behavior normal.        Thought Content: Thought content normal.        Judgment: Judgment normal.      UC Treatments / Results  Labs (all labs ordered are listed, but only abnormal results are displayed) Labs Reviewed  POCT URINALYSIS DIP (MANUAL ENTRY) - Abnormal; Notable for the following components:      Result Value   Clarity, UA cloudy (*)    Spec Grav, UA >=1.030 (*)    Blood, UA trace-lysed (*)    Protein Ur, POC trace (*)    Leukocytes, UA Moderate (2+) (*)    All other components within normal limits  URINE CULTURE    EKG   Radiology No results found.  Procedures Procedures (including critical care time)  Medications Ordered in UC Medications - No data to display  Initial Impression / Assessment and Plan / UC Course  I have reviewed the triage vital signs and the nursing notes.  Pertinent labs & imaging results that were available during my care of the patient were reviewed by me and considered in my medical decision making (see chart for details).     Urinalysis showing moderate amount of leukocytes which indicates urinary tract infection with associated symptoms.  Will treat with Macrobid antibiotic as patient has taken this before and tolerated  well.  Urine culture is pending.  Will await results for any further treatment.  Patient advised to follow-up if symptoms persist or worsen.  Patient verbalized understanding and was agreeable with plan. Final Clinical Impressions(s) / UC Diagnoses   Final diagnoses:  Acute cystitis with hematuria  Dysuria  Urinary frequency     Discharge Instructions      You have a urinary tract infection which is being treated with antibiotic.  Urine culture is pending.  We will call if it is abnormal.  Follow-up if symptoms persist or worsen.    ED Prescriptions     Medication Sig Dispense Auth. Provider   nitrofurantoin, macrocrystal-monohydrate, (MACROBID) 100 MG capsule Take 1 capsule (100 mg total) by mouth 2 (two) times daily. 10 capsule Teodora Medici, [place]      PDMP not reviewed this encounter.   Teodora Medici, [place] 12/20/21 1941

## 2021-12-20 NOTE — ED Triage Notes (Signed)
Pt c/o dysuria, and new lower back pain onset ~ 2 days ago concerned for uti

## 2021-12-23 LAB — URINE CULTURE: Culture: >=100000 — AB

## 2022-01-23 ENCOUNTER — Ambulatory Visit: Payer: BC Managed Care – PPO | Admitting: Neurology

## 2022-01-25 ENCOUNTER — Other Ambulatory Visit: Payer: Self-pay

## 2022-01-26 ENCOUNTER — Other Ambulatory Visit: Payer: Self-pay

## 2022-02-09 ENCOUNTER — Ambulatory Visit: Payer: Self-pay | Admitting: Nurse Practitioner

## 2022-02-15 ENCOUNTER — Encounter: Payer: Self-pay | Admitting: Nurse Practitioner

## 2022-02-15 ENCOUNTER — Ambulatory Visit: Payer: BC Managed Care – PPO | Admitting: Nurse Practitioner

## 2022-02-15 ENCOUNTER — Other Ambulatory Visit (HOSPITAL_COMMUNITY): Payer: Self-pay

## 2022-02-15 VITALS — BP 139/77 | HR 66 | Ht 68.0 in | Wt 200.0 lb

## 2022-02-15 DIAGNOSIS — M545 Low back pain, unspecified: Secondary | ICD-10-CM

## 2022-02-15 DIAGNOSIS — E039 Hypothyroidism, unspecified: Secondary | ICD-10-CM

## 2022-02-15 DIAGNOSIS — R739 Hyperglycemia, unspecified: Secondary | ICD-10-CM

## 2022-02-15 LAB — POCT URINALYSIS DIPSTICK
Bilirubin, UA: NEGATIVE
Blood, UA: NEGATIVE
Glucose, UA: NEGATIVE
Ketones, UA: NEGATIVE
Nitrite, UA: NEGATIVE
Protein, UA: NEGATIVE
Spec Grav, UA: 1.025 (ref 1.010–1.025)
Urobilinogen, UA: 0.2 E.U./dL
pH, UA: 6 (ref 5.0–8.0)

## 2022-02-15 LAB — POCT GLYCOSYLATED HEMOGLOBIN (HGB A1C): Hemoglobin A1C: 5.2 % (ref 4.0–5.6)

## 2022-02-15 MED ORDER — SULFAMETHOXAZOLE-TRIMETHOPRIM 800-160 MG PO TABS
1.0000 | ORAL_TABLET | Freq: Two times a day (BID) | ORAL | 0 refills | Status: AC
  Filled 2022-02-15: qty 6, 3d supply, fill #0

## 2022-02-15 MED ORDER — SYNTHROID 25 MCG PO TABS
25.0000 ug | ORAL_TABLET | Freq: Every day | ORAL | 2 refills | Status: DC
  Filled 2022-02-15 – 2022-02-28 (×2): qty 30, 30d supply, fill #0
  Filled 2022-04-02: qty 30, 30d supply, fill #1
  Filled 2022-04-26: qty 30, 30d supply, fill #2

## 2022-02-15 MED ORDER — TIZANIDINE HCL 4 MG PO TABS
4.0000 mg | ORAL_TABLET | Freq: Four times a day (QID) | ORAL | 0 refills | Status: DC | PRN
  Filled 2022-02-15: qty 30, 8d supply, fill #0

## 2022-02-15 NOTE — Progress Notes (Signed)
$'@Patient'n$  ID: Madison Walker, female    DOB: 04/17/1965, 57 y.o.   MRN: [id number]  Chief Complaint  Patient presents with   Follow-up    Pt stated--back ache worse at night--3 days. Denied fever.    Referring provider: Fenton Foy, NP   HPI  57 year old female with history of hypothyroidism, Hashimoto's thyroiditis, chest pain, small fiber neuropathy, heart murmur.   Patient presents today for a routine follow-up.  Overall she has been doing well since her last visit here.  She does complain of some low back pain over the past 3 days.  She would like to be checked for a UA.  She states that she could have pulled her back lifting heavy objects at work.  UA in office does show trace leukocytes.  Will treat with short course of antibiotic and also order muscle relaxer for pain. Denies f/c/s, n/v/d, hemoptysis, PND, leg swelling Denies chest pain or edema       Allergies  Allergen Reactions   Iodine Itching, Rash and Other (See Comments)    Caused rash/hives after a surgery    Immunization History  Administered Date(s) Administered   Influenza Whole 01/16/2018   Influenza,inj,Quad PF,6+ Mos 12/13/2016, 12/30/2019   Influenza-Unspecified 12/23/2019   PFIZER(Purple Top)SARS-COV-2 Vaccination 09/12/2019, 10/03/2019   Tdap 06/28/2004    Past Medical History:  Diagnosis Date   Heart murmur    Hypothyroidism    Varicose veins of bilateral lower extremities with other complications     Tobacco History: Social History   Tobacco Use  Smoking Status Never  Smokeless Tobacco Never   Counseling given: Not Answered   Outpatient Encounter Medications as of 02/15/2022  Medication Sig   benzonatate (TESSALON) 100 MG capsule Take 1 capsule (100 mg total) by mouth every 8 (eight) hours as needed for cough.   clobetasol (TEMOVATE) 0.05 % external solution Apply topically 2 (two) times daily.   clobetasol ointment (TEMOVATE) 0.05 % Apply topically a thin layer to affected area  twice daily   fluticasone (CUTIVATE) 0.05 % cream    mupirocin ointment (BACTROBAN) 2 % Apply 1 application topically 2 (two) times daily.   nitrofurantoin, macrocrystal-monohydrate, (MACROBID) 100 MG capsule Take 1 capsule (100 mg total) by mouth 2 (two) times daily.   Olopatadine HCl 0.2 % SOLN Apply 1 drop to eye daily.   sulfamethoxazole-trimethoprim (BACTRIM DS) 800-160 MG tablet Take 1 tablet by mouth 2 times daily for 3 days.   tiZANidine (ZANAFLEX) 4 MG tablet Take 1 tablet (4 mg total) by mouth every 6 (six) hours as needed for muscle spasms.   [DISCONTINUED] aspirin 81 MG chewable tablet 1 tablet   [DISCONTINUED] levothyroxine (SYNTHROID) 25 MCG tablet Take by mouth.   [DISCONTINUED] levothyroxine (SYNTHROID) 25 MCG tablet Take 0.5 tablets (12.5 mcg total) by mouth daily.   [DISCONTINUED] SYNTHROID 25 MCG tablet Take 1 tablet (25 mcg total) by mouth daily at 6 (six) AM.   albuterol (VENTOLIN HFA) 108 (90 Base) MCG/ACT inhaler Inhale 1-2 puffs into the lungs every 6 (six) hours as needed for wheezing or shortness of breath. (Patient not taking: Reported on 02/15/2022)   aspirin EC 81 MG tablet Take 81 mg by mouth daily. Swallow whole.   fluticasone (FLONASE) 50 MCG/ACT nasal spray Place 1 spray into both nostrils daily.   Krill Oil 500 MG CAPS See admin instructions.   predniSONE (STERAPRED UNI-PAK 21 TAB) 10 MG (21) TBPK tablet Take by mouth daily. Take 6 tabs by mouth daily  for 2 days, then 5 tabs for 2 days, then 4 tabs for 2 days, then 3 tabs for 2 days, 2 tabs for 2 days, then 1 tab by mouth daily for 2 days (Patient not taking: Reported on 11/08/2021)   SYNTHROID 25 MCG tablet Take 1 tablet (25 mcg total) by mouth daily at 6 (six) AM.   [DISCONTINUED] amoxicillin-clavulanate (AUGMENTIN) 875-125 MG tablet Take 1 tablet by mouth every 12 (twelve) hours. (Patient not taking: Reported on 11/08/2021)   No facility-administered encounter medications on file as of 02/15/2022.     Review of  Systems  Review of Systems  Constitutional: Negative.   HENT: Negative.    Cardiovascular: Negative.   Gastrointestinal: Negative.   Musculoskeletal:  Positive for back pain.  Allergic/Immunologic: Negative.   Neurological: Negative.   Psychiatric/Behavioral: Negative.         Physical Exam  BP 139/77   Pulse 66   Ht '5\' 8"'$  (1.727 m)   Wt 200 lb (90.7 kg)   LMP 09/06/2014 (Approximate)   SpO2 98%   BMI 30.41 kg/m   Wt Readings from Last 5 Encounters:  02/15/22 200 lb (90.7 kg)  11/08/21 202 lb 12.8 oz (92 kg)  10/03/21 200 lb (90.7 kg)  06/08/21 204 lb 3.2 oz (92.6 kg)  12/07/20 192 lb 12.8 oz (87.5 kg)     Physical Exam Vitals and nursing note reviewed.  Constitutional:      General: She is not in acute distress.    Appearance: She is well-developed.  Cardiovascular:     Rate and Rhythm: Normal rate and regular rhythm.  Pulmonary:     Effort: Pulmonary effort is normal.     Breath sounds: Normal breath sounds.  Neurological:     Mental Status: She is alert and oriented to person, place, and time.      Lab Results:  CBC    Component Value Date/Time   WBC 5.5 11/08/2021 1357   WBC 5.6 10/03/2021 1322   RBC 3.74 (L) 11/08/2021 1357   RBC 3.86 (L) 10/03/2021 1322   HGB 12.4 11/08/2021 1357   HCT 37.1 11/08/2021 1357   PLT 198 11/08/2021 1357   MCV 99 (H) 11/08/2021 1357   MCH 33.2 (H) 11/08/2021 1357   MCH 33.9 10/03/2021 1322   MCHC 33.4 11/08/2021 1357   MCHC 33.3 10/03/2021 1322   RDW 11.9 11/08/2021 1357   LYMPHSABS 2.1 12/07/2020 1538   MONOABS 0.7 09/26/2013 1322   EOSABS 0.2 12/07/2020 1538   BASOSABS 0.0 12/07/2020 1538    BMET    Component Value Date/Time   NA 143 11/08/2021 1357   K 4.3 11/08/2021 1357   CL 104 11/08/2021 1357   CO2 24 11/08/2021 1357   GLUCOSE 101 (H) 11/08/2021 1357   GLUCOSE 107 (H) 10/03/2021 1322   BUN 16 11/08/2021 1357   CREATININE 0.79 11/08/2021 1357   CALCIUM 9.3 11/08/2021 1357   GFRNONAA >60  10/03/2021 1322   GFRAA 109 08/25/2019 1157    BNP No results found for: "BNP"  ProBNP No results found for: "PROBNP"  Imaging: No results found.   Assessment & Plan:   Hypothyroidism - SYNTHROID 25 MCG tablet; Take 1 tablet (25 mcg total) by mouth daily at 6 (six) AM.  Dispense: 30 tablet; Refill: 2   2. Acute bilateral low back pain without sciatica  - POCT urinalysis dipstick - Urine Culture - sulfamethoxazole-trimethoprim (BACTRIM DS) 800-160 MG tablet; Take 1 tablet by mouth 2 (two) times  daily for 3 days.  Dispense: 6 tablet; Refill: 0  3. Blood glucose elevated  - POCT glycosylated hemoglobin (Hb A1C)   Follow up:  Follow up in 3 months     Fenton Foy, NP 02/15/2022

## 2022-02-15 NOTE — Patient Instructions (Addendum)
1. Hypothyroidism, unspecified type  - SYNTHROID 25 MCG tablet; Take 1 tablet (25 mcg total) by mouth daily at 6 (six) AM.  Dispense: 30 tablet; Refill: 2   2. Acute bilateral low back pain without sciatica  - POCT urinalysis dipstick - Urine Culture - sulfamethoxazole-trimethoprim (BACTRIM DS) 800-160 MG tablet; Take 1 tablet by mouth 2 (two) times daily for 3 days.  Dispense: 6 tablet; Refill: 0  3. Blood glucose elevated  - POCT glycosylated hemoglobin (Hb A1C)   Follow up:  Follow up in 3 months

## 2022-02-15 NOTE — Assessment & Plan Note (Signed)
-   SYNTHROID 25 MCG tablet; Take 1 tablet (25 mcg total) by mouth daily at 6 (six) AM.  Dispense: 30 tablet; Refill: 2   2. Acute bilateral low back pain without sciatica  - POCT urinalysis dipstick - Urine Culture - sulfamethoxazole-trimethoprim (BACTRIM DS) 800-160 MG tablet; Take 1 tablet by mouth 2 (two) times daily for 3 days.  Dispense: 6 tablet; Refill: 0  3. Blood glucose elevated  - POCT glycosylated hemoglobin (Hb A1C)   Follow up:  Follow up in 3 months

## 2022-02-16 ENCOUNTER — Other Ambulatory Visit (HOSPITAL_COMMUNITY): Payer: Self-pay

## 2022-02-17 LAB — URINE CULTURE: Organism ID, Bacteria: NO GROWTH

## 2022-02-28 ENCOUNTER — Other Ambulatory Visit (HOSPITAL_COMMUNITY): Payer: Self-pay

## 2022-03-28 ENCOUNTER — Ambulatory Visit: Payer: BC Managed Care – PPO | Admitting: Neurology

## 2022-04-02 ENCOUNTER — Encounter: Payer: Self-pay | Admitting: Emergency Medicine

## 2022-04-02 ENCOUNTER — Ambulatory Visit
Admission: EM | Admit: 2022-04-02 | Discharge: 2022-04-02 | Disposition: A | Discharge status: Home / Self Care | Payer: BC Managed Care – PPO | Attending: Internal Medicine | Admitting: Internal Medicine

## 2022-04-02 ENCOUNTER — Other Ambulatory Visit (HOSPITAL_COMMUNITY): Payer: Self-pay

## 2022-04-02 DIAGNOSIS — R300 Dysuria: Secondary | ICD-10-CM | POA: Insufficient documentation

## 2022-04-02 DIAGNOSIS — M545 Low back pain, unspecified: Secondary | ICD-10-CM | POA: Diagnosis not present

## 2022-04-02 DIAGNOSIS — R350 Frequency of micturition: Secondary | ICD-10-CM | POA: Insufficient documentation

## 2022-04-02 DIAGNOSIS — Z113 Encounter for screening for infections with a predominantly sexual mode of transmission: Secondary | ICD-10-CM | POA: Diagnosis not present

## 2022-04-02 DIAGNOSIS — J069 Acute upper respiratory infection, unspecified: Secondary | ICD-10-CM

## 2022-04-02 LAB — POCT URINALYSIS DIP (MANUAL ENTRY)
Bilirubin, UA: NEGATIVE
Blood, UA: NEGATIVE
Glucose, UA: NEGATIVE mg/dL
Ketones, POC UA: NEGATIVE mg/dL
Leukocytes, UA: NEGATIVE
Nitrite, UA: NEGATIVE
Spec Grav, UA: >=1.03 — AB (ref 1.010–1.025)
Urobilinogen, UA: 0.2 E.U./dL
pH, UA: 6 (ref 5.0–8.0)

## 2022-04-02 MED ORDER — CEPHALEXIN 500 MG PO CAPS
500.0000 mg | ORAL_CAPSULE | Freq: Two times a day (BID) | ORAL | 0 refills | Status: AC
  Filled 2022-04-02: qty 14, 7d supply, fill #0

## 2022-04-02 NOTE — ED Provider Notes (Signed)
[name]    CSN: [id number] Arrival date & time: 04/02/22  1133      History   Chief Complaint Chief Complaint  Patient presents with   Nasal Congestion   Dysuria    HPI Nalijah Nastri is a 57 y.o. female.   Patient presents with 7-day history of urinary burning, midline lower back pain, urinary frequency.  Denies vaginal discharge, hematuria, abdominal pain, fever.  Patient also reporting 7-day history of scratchy throat, nasal congestion, nonproductive cough, sinus pressure and pain.  Has taken Allegra and Benadryl with minimal improvement of symptoms.  Denies any known fevers or sick contacts.  Denies chest pain, shortness of breath, nausea, vomiting, diarrhea, abdominal pain.  Denies history of asthma or COPD and patient does not smoke cigarettes.  Patient also has elevated blood pressure reading.  She denies history of hypertension.  Denies chest pain, shortness of breath, headache, dizziness, blurred vision, nausea, vomiting.   Dysuria   Past Medical History:  Diagnosis Date   Heart murmur    Hypothyroidism    Varicose veins of bilateral lower extremities with other complications     Patient Active Problem List   Diagnosis Date Noted   Small fiber neuropathy 06/14/2021   Routine health maintenance 05/11/2021   Hashimoto's thyroiditis 12/22/2020   Hypothyroidism 12/22/2020   Hypothyroidism due to Hashimoto's thyroiditis 12/21/2020   Palpitations 12/21/2020   Other constipation 11/18/2020   Nausea without vomiting 11/18/2020   Right bundle branch block 09/30/2018   Family history of heart disease 06/19/2018   Atypical chest pain 11/22/2017   Dermoid cyst of ovary (8 cm) 04/07/2015    Past Surgical History:  Procedure Laterality Date   LAPAROSCOPIC UNILATERAL SALPINGO OOPHERECTOMY Left 05/31/2015   Procedure: LAPAROSCOPIC UNILATERAL SALPINGO OOPHORECTOMY;  Surgeon: Osborne Oman, MD;  Location: [facility];  Service: Gynecology;  Laterality:  Left;   TUBAL LIGATION     UNILATERAL SALPINGECTOMY Right 05/31/2015   Procedure: UNILATERAL SALPINGECTOMY;  Surgeon: Osborne Oman, MD;  Location: [facility];  Service: Gynecology;  Laterality: Right;    OB History     Gravida  2   Para  2   Term  2   Preterm      AB      Living  2      SAB      IAB      Ectopic      Multiple      Live Births               Home Medications    Prior to Admission medications   Medication Sig Start Date End Date Taking? Authorizing Provider  cephALEXin (KEFLEX) 500 MG capsule Take 1 capsule (500 mg total) by mouth 2 (two) times daily for 7 days. 04/02/22 04/09/22 Yes Beuford Garcilazo, Michele Rockers, FNP  albuterol (VENTOLIN HFA) 108 (90 Base) MCG/ACT inhaler Inhale 1-2 puffs into the lungs every 6 (six) hours as needed for wheezing or shortness of breath. Patient not taking: Reported on 02/15/2022 08/16/21   Teodora Medici, FNP  aspirin EC 81 MG tablet Take 81 mg by mouth daily. Swallow whole.    [provider]  benzonatate (TESSALON) 100 MG capsule Take 1 capsule (100 mg total) by mouth every 8 (eight) hours as needed for cough. 08/16/21   Teodora Medici, FNP  clobetasol (TEMOVATE) 0.05 % external solution Apply topically 2 (two) times daily. 09/27/21   Fenton Foy, NP  clobetasol ointment (TEMOVATE) 0.05 %  Apply topically a thin layer to affected area twice daily 06/15/21   Marylynn Pearson, MD  fluticasone (CUTIVATE) 0.05 % cream  11/16/20   [provider]  fluticasone (FLONASE) 50 MCG/ACT nasal spray Place 1 spray into both nostrils daily. 08/16/21   Teodora Medici, FNP  Krill Oil 500 MG CAPS See admin instructions.    [provider]  mupirocin ointment (BACTROBAN) 2 % Apply 1 application topically 2 (two) times daily. 04/03/21   Trula Slade, DPM  nitrofurantoin, macrocrystal-monohydrate, (MACROBID) 100 MG capsule Take 1 capsule (100 mg total) by mouth 2 (two) times daily. 12/20/21   Teodora Medici, FNP  Olopatadine HCl  0.2 % SOLN Apply 1 drop to eye daily. 07/30/20   Vanessa Kick, MD  predniSONE (STERAPRED UNI-PAK 21 TAB) 10 MG (21) TBPK tablet Take by mouth daily. Take 6 tabs by mouth daily  for 2 days, then 5 tabs for 2 days, then 4 tabs for 2 days, then 3 tabs for 2 days, 2 tabs for 2 days, then 1 tab by mouth daily for 2 days Patient not taking: Reported on 11/08/2021 08/25/21   Hazel Sams, PA-C  SYNTHROID 25 MCG tablet Take 1 tablet (25 mcg total) by mouth daily at 6 (six) AM. 02/15/22   Fenton Foy, NP  tiZANidine (ZANAFLEX) 4 MG tablet Take 1 tablet (4 mg total) by mouth every 6 (six) hours as needed for muscle spasms. 02/15/22   Fenton Foy, NP    Family History Family History  Problem Relation Age of Onset   Hypertension Mother    COPD Mother    Hernia Mother    Diabetes Father    COPD Father    Hypertension Sister    Hypertension Brother    Coronary artery disease Brother        MI in his 68's   Diabetes Brother    Diabetes Other    Colon cancer Neg Hx    Colon polyps Neg Hx    Esophageal cancer Neg Hx    Rectal cancer Neg Hx    Stomach cancer Neg Hx    Neuropathy Neg Hx     Social History Social History   Tobacco Use   Smoking status: Never   Smokeless tobacco: Never  Vaping Use   Vaping Use: Never used  Substance Use Topics   Alcohol use: No   Drug use: No     Allergies   Iodine   Review of Systems Review of Systems Per HPI  Physical Exam Triage Vital Signs ED Triage Vitals  Enc Vitals Group     BP 04/02/22 1251 (!) 167/81     Pulse Rate 04/02/22 1251 81     Resp 04/02/22 1251 17     Temp 04/02/22 1251 98.1 F (36.7 C)     Temp Source 04/02/22 1251 Oral     SpO2 04/02/22 1251 98 %     Weight --      Height --      Head Circumference --      Peak Flow --      Pain Score 04/02/22 1249 2     Pain Loc --      Pain Edu? --      Excl. in [place]? --    No data found.  Updated Vital Signs BP (!) 167/81 (BP Location: Left Arm)   Pulse 81   Temp  98.1 F (36.7 C) (Oral)   Resp 17  LMP 09/06/2014 (Approximate)   SpO2 98%   Visual Acuity Right Eye Distance:   Left Eye Distance:   Bilateral Distance:    Right Eye Near:   Left Eye Near:    Bilateral Near:     Physical Exam Constitutional:      General: She is not in acute distress.    Appearance: Normal appearance. She is not toxic-appearing or diaphoretic.  HENT:     Head: Normocephalic and atraumatic.     Right Ear: Tympanic membrane and ear canal normal.     Left Ear: Tympanic membrane and ear canal normal.     Nose: Congestion present.     Mouth/Throat:     Mouth: Mucous membranes are moist.     Pharynx: No posterior oropharyngeal erythema.  Eyes:     Extraocular Movements: Extraocular movements intact.     Conjunctiva/sclera: Conjunctivae normal.     Pupils: Pupils are equal, round, and reactive to light.  Cardiovascular:     Rate and Rhythm: Normal rate and regular rhythm.     Pulses: Normal pulses.     Heart sounds: Normal heart sounds.  Pulmonary:     Effort: Pulmonary effort is normal. No respiratory distress.     Breath sounds: Normal breath sounds. No stridor. No wheezing, rhonchi or rales.  Abdominal:     General: Abdomen is flat. Bowel sounds are normal. There is no distension.     Palpations: Abdomen is soft.     Tenderness: There is no abdominal tenderness.  Musculoskeletal:        General: Normal range of motion.     Cervical back: Normal range of motion.     Comments: No tenderness to palpation to back.  Skin:    General: Skin is warm and dry.  Neurological:     General: No focal deficit present.     Mental Status: She is alert and oriented to person, place, and time. Mental status is at baseline.     Cranial Nerves: Cranial nerves 2-12 are intact.     Sensory: Sensation is intact.     Motor: Motor function is intact.     Coordination: Coordination is intact.     Gait: Gait is intact.  Psychiatric:        Mood and Affect: Mood normal.         Behavior: Behavior normal.      UC Treatments / Results  Labs (all labs ordered are listed, but only abnormal results are displayed) Labs Reviewed  POCT URINALYSIS DIP (MANUAL ENTRY) - Abnormal; Notable for the following components:      Result Value   Spec Grav, UA >=1.030 (*)    Protein Ur, POC trace (*)    All other components within normal limits  URINE CULTURE  CERVICOVAGINAL ANCILLARY ONLY    EKG   Radiology No results found.  Procedures Procedures (including critical care time)  Medications Ordered in UC Medications - No data to display  Initial Impression / Assessment and Plan / UC Course  I have reviewed the triage vital signs and the nursing notes.  Pertinent labs & imaging results that were available during my care of the patient were reviewed by me and considered in my medical decision making (see chart for details).     1.  Urinary symptoms UA was unremarkable.  Will send urine culture to confirm given patient is having typical UTI symptoms.  Vaginal swab also pending to test for BV and yeast as  patient does not have any concern for STD.  This is pending.  Awaiting result.  Will opt to treat with cephalexin while awaiting results.  2.  Upper respiratory infection Symptoms most likely started off as a viral illness but given duration of symptoms and symptoms being refractory to over-the-counter medications, will treat with antibiotic.  Cephalexin will cover for both UTI and upper respiratory.  COVID testing deferred given duration of symptoms as it would not change treatment.  Advised supportive care and symptom management as well.  Discussed return precautions.  Patient verbalized understanding and was agreeable with plan.  Advised patient to monitor blood pressure very closely at home.  Suspect patient's over-the-counter medications could be causing elevated blood pressure.  She denies history of hypertension and is asymptomatic regarding blood pressure  so do not think that emergent evaluation is necessary.  Advised strict follow-up with urgent care or PCP if blood pressure remains elevated.  Patient verbalized understanding and was agreeable with plan. Final Clinical Impressions(s) / UC Diagnoses   Final diagnoses:  Dysuria  Urinary frequency  Acute upper respiratory infection     Discharge Instructions      I am treating you with an antibiotic for upper respiratory and urinary symptoms.  Urine culture and vaginal swab are pending.  Will call if they are abnormal.  Please monitor your blood pressure closely at home and follow-up with urgent care or primary care doctor if it remains elevated.     ED Prescriptions     Medication Sig Dispense Auth. Provider   cephALEXin (KEFLEX) 500 MG capsule Take 1 capsule (500 mg total) by mouth 2 (two) times daily for 7 days. 14 capsule [place], Michele Rockers, [place]      PDMP not reviewed this encounter.   Teodora Medici, [place] 04/02/22 1326

## 2022-04-02 NOTE — Discharge Instructions (Signed)
I am treating you with an antibiotic for upper respiratory and urinary symptoms.  Urine culture and vaginal swab are pending.  Will call if they are abnormal.  Please monitor your blood pressure closely at home and follow-up with urgent care or primary care doctor if it remains elevated.

## 2022-04-02 NOTE — ED Triage Notes (Signed)
Pt presents with dysuria and lower back pain xs 1 week. Pt also complains of scratchy throat, congestion, and sinus pain xs 1 week.

## 2022-04-03 LAB — CERVICOVAGINAL ANCILLARY ONLY
Bacterial Vaginitis (gardnerella): NEGATIVE
Candida Glabrata: NEGATIVE
Candida Vaginitis: NEGATIVE
Comment: NEGATIVE
Comment: NEGATIVE
Comment: NEGATIVE

## 2022-04-03 LAB — URINE CULTURE: Culture: NO GROWTH

## 2022-04-26 ENCOUNTER — Other Ambulatory Visit (HOSPITAL_COMMUNITY): Payer: Self-pay

## 2022-05-04 DIAGNOSIS — E038 Other specified hypothyroidism: Secondary | ICD-10-CM | POA: Diagnosis not present

## 2022-05-04 DIAGNOSIS — E063 Autoimmune thyroiditis: Secondary | ICD-10-CM | POA: Diagnosis not present

## 2022-05-08 ENCOUNTER — Ambulatory Visit: Payer: BC Managed Care – PPO | Admitting: Neurology

## 2022-05-09 ENCOUNTER — Encounter: Payer: Self-pay | Admitting: Neurology

## 2022-05-09 ENCOUNTER — Ambulatory Visit: Payer: Self-pay | Admitting: Nurse Practitioner

## 2022-05-09 ENCOUNTER — Ambulatory Visit: Payer: BC Managed Care – PPO | Admitting: Neurology

## 2022-05-09 VITALS — BP 131/74 | HR 65 | Ht 68.0 in | Wt 196.0 lb

## 2022-05-09 DIAGNOSIS — G629 Polyneuropathy, unspecified: Secondary | ICD-10-CM | POA: Diagnosis not present

## 2022-05-09 NOTE — Progress Notes (Unsigned)
Saliva sample to [facility] by [place] today. [phone].  [id number]

## 2022-05-09 NOTE — Patient Instructions (Signed)
Emg/ncs  Electromyoneurogram Electromyoneurogram is a test to check how well your muscles and nerves are working. This procedure includes the combined use of electromyogram (EMG) and nerve conduction study (NCS). EMG is used to evaluate muscles and the nerves that control those muscles. NCS, which is also called electroneurogram, measures how well your nerves conduct electricity. The procedures should be done together to check if your muscles and nerves are healthy. If the results of the tests are abnormal, this may indicate disease or injury, such as a neuromuscular disease or peripheral nerve damage. Tell a health care provider about: Any allergies you have. All medicines you are taking, including vitamins, herbs, eye drops, creams, and over-the-counter medicines. Any bleeding problems you have. Any surgeries you have had. Any medical conditions you have. What are the risks? Generally, this is a safe procedure. However, problems may occur, including: Bleeding or bruising. Infection where the electrodes were inserted. What happens before the test? Medicines Take all of your usually prescribed medications before this testing is performed. Do not stop your blood thinners unless advised by your prescribing physician. General instructions Your health care provider may ask you to warm the limb that will be checked with warm water, hot pack, or wrapping the limb in a blanket. Do not use lotions or creams on the same day that you will be having the procedure. What happens during the test? For EMG  Your health care provider will ask you to stay in a position so that the muscle being studied can be accessed. You will be sitting or lying down. You may be given a medicine to numb the area (local anesthetic) and the skin will be disinfected. A very thin needle that has an electrode will be inserted into your muscle, one muscle at a time. Typically, multiple muscles are evaluated during a single  study. Another small electrode will be placed on your skin near the muscle. Your health care provider will ask you to continue to remain still. The electrodes will record the electrical activity of your muscles. You may see this on a monitor or hear it in the room. After your muscles have been studied at rest, your health care provider will ask you to contract or flex your muscles. The electrodes will record the electrical activity of your muscles. Your health care provider will remove the electrodes and the electrode needle when the procedure is finished. The procedure may vary among health care providers and hospitals. For NCS  An electrode that records your nerve activity (recording electrode) will be placed on your skin by the muscle that is being studied. An electrode that is used as a reference (reference electrode) will be placed near the recording electrode. A paste or gel will be applied to your skin between the recording electrode and the reference electrode. Your nerve will be stimulated with a mild shock. The speed of the nerves and strength of response is recorded by the electrodes. Your health care provider will remove the electrodes and the gel when the procedure is finished. The procedure may vary among health care providers and hospitals. What can I expect after the test? It is up to you to get your test results. Ask your health care provider, or the department that is doing the test, when your results will be ready. Your health care provider may: Give you medicines for any pain. Monitor the insertion sites to make sure that bleeding stops. You should be able to drive yourself to and from the test.   Discomfort can persist for a few hours after the test, but should be better the next day. Contact a health care provider if: You have swelling, redness, or drainage at any of the insertion sites. Summary Electromyoneurogram is a test to check how well your muscles and nerves are  working. If the results of the tests are abnormal, this may indicate disease or injury. This is a safe procedure. However, problems may occur, such as bleeding and infection. Your health care provider will do two tests to complete this procedure. One checks your muscles (EMG) and another checks your nerves (NCS). It is up to you to get your test results. Ask your health care provider, or the department that is doing the test, when your results will be ready. This information is not intended to replace advice given to you by your health care provider. Make sure you discuss any questions you have with your health care provider. Document Revised: 10/12/2020 Document Reviewed: 09/11/2020 Elsevier Patient Education  2023 [employer]  

## 2022-05-09 NOTE — Progress Notes (Unsigned)
[facility]    Provider:  Dr Jaynee Eagles Requesting Provider: Fenton Foy, NP Primary Care Provider:  Fenton Foy, NP  CC:  feet feel cols  05/09/2022: Feet still feel cold and prickly feeling in the big toe. Workup has been completely negative with extensive serum testing. She brings the [facility] genetic test today for [place] to help. Feel stable, no progression. No alcohol, no smoking. She has had her legs tested for blood flow and has been normal. I helped with the testing and mailing and collecting samples for [facility] and mailed it out for her  Patient complains of symptoms per HPI as well as the following symptoms: cold feet . Pertinent negatives and positives per HPI. All others negative   06/08/2021: Patient is here after having a biopsy with podiatry.  Patient was told that the biopsy was positive for small fiber neuropathy.  We discussed all the causes of small fiber neuropathy, and in detail we discussed options for her since initial testing showed no cause for small fiber neuropathy.  HPI 12/07/2020:  Madison Walker is a 57 y.o. female here as requested by Fenton Foy, NP for neuropathy. PMHx hypothyroidism. I reviewed requesting provider's notes, only stated history is "numbness and tingling in the toes" otherwise no information in paperwork sent on chief complaint.   She has cold feet and needle prick feeling in her big toe. She went to vein and vascular for her varicose veins and circulation looked good. They actually feel cold to the touch as well. They feel cold right now even with warm socks. No rashes or redness, she has had a little swelling in her ankles. She is seeing endocrinology to help with hypothyroidism and she has not had mediction since august because side effects. Fingers are fine. Toes do not turn colors. No problems in feet summertime just when cold. When it is cold outside like even in the 50s her legs get cold. Actually feels cold to the  touch as well, comes and goes. She feels some weakness. She has vericose veins. Not worsening. Off and on. No low back pain associated. Stable, not progressive. No other focal neurologic deficits, associated symptoms, inciting events or modifiable factors.  Reviewed notes, labs and imaging from outside physicians, which showed:  TSH 3.51  DG lumbar spine 09/2013: reviewed images: FINDINGS:  Vertebral body height and alignment are maintained. Mild loss of  disc space height at L4-5 and L5-S1 is noted. There is also facet  degenerative change at these levels.   IMPRESSION:  No acute finding.   Mild appearing lower lumbar spondylosis, unchanged.   Review of Systems: Patient complains of symptoms per HPI as well as the following symptoms thyroid disease. Pertinent negatives and positives per HPI. All others negative.   Social History   Socioeconomic History   Marital status: Divorced    Spouse name: Not on file   Number of children: Not on file   Years of education: Not on file   Highest education level: Not on file  Occupational History   Not on file  Tobacco Use   Smoking status: Never   Smokeless tobacco: Never  Vaping Use   Vaping Use: Never used  Substance and Sexual Activity   Alcohol use: No   Drug use: No   Sexual activity: Not on file  Other Topics Concern   Not on file  Social History Narrative   Lives with her boyfriend in a one story home. Outside has 3  steps    Right handed   Caffeine: 1 large mug of coffee every morning, mix of regular and 1/2 caff, 16 oz mtn dew average 4 times per week while at work    Social Determinants of Radio broadcast assistant Strain: Not on file  Food Insecurity: Not on file  Transportation Needs: Not on file  Physical Activity: Not on file  Stress: Not on file  Social Connections: Not on file  Intimate Partner Violence: Not on file    Family History  Problem Relation Age of Onset   Hypertension Mother    COPD Mother     Hernia Mother    Diabetes Father    COPD Father    Hypertension Sister    Hypertension Brother    Coronary artery disease Brother        MI in his 66's   Diabetes Brother    Diabetes Other    Colon cancer Neg Hx    Colon polyps Neg Hx    Esophageal cancer Neg Hx    Rectal cancer Neg Hx    Stomach cancer Neg Hx    Neuropathy Neg Hx     Past Medical History:  Diagnosis Date   Heart murmur    Hypothyroidism    Varicose veins of bilateral lower extremities with other complications     Patient Active Problem List   Diagnosis Date Noted   Small fiber neuropathy 06/14/2021   Routine health maintenance 05/11/2021   Hashimoto's thyroiditis 12/22/2020   Hypothyroidism 12/22/2020   Hypothyroidism due to Hashimoto's thyroiditis 12/21/2020   Palpitations 12/21/2020   Other constipation 11/18/2020   Nausea without vomiting 11/18/2020   Right bundle branch block 09/30/2018   Family history of heart disease 06/19/2018   Atypical chest pain 11/22/2017   Dermoid cyst of ovary (8 cm) 04/07/2015    Past Surgical History:  Procedure Laterality Date   LAPAROSCOPIC UNILATERAL SALPINGO OOPHERECTOMY Left 05/31/2015   Procedure: LAPAROSCOPIC UNILATERAL SALPINGO OOPHORECTOMY;  Surgeon: Osborne Oman, MD;  Location: [facility];  Service: Gynecology;  Laterality: Left;   TUBAL LIGATION     UNILATERAL SALPINGECTOMY Right 05/31/2015   Procedure: UNILATERAL SALPINGECTOMY;  Surgeon: Osborne Oman, MD;  Location: [facility];  Service: Gynecology;  Laterality: Right;    Current Outpatient Medications  Medication Sig Dispense Refill   aspirin EC 81 MG tablet Take 81 mg by mouth daily. Swallow whole.     clobetasol (TEMOVATE) 0.05 % external solution Apply topically 2 (two) times daily. (Patient taking differently: Apply topically 2 (two) times daily. As needed) 50 mL 0   fluticasone (CUTIVATE) 0.05 % cream as needed.     fluticasone (FLONASE) 50 MCG/ACT nasal spray Place 1 spray into both nostrils  daily. (Patient taking differently: Place 1 spray into both nostrils as needed.) 16 g 0   Omega-3 Fatty Acids (OMEGA 3 PO) Take by mouth.     SYNTHROID 25 MCG tablet Take 1 tablet (25 mcg total) by mouth daily at 6 (six) AM. 30 tablet 2   tiZANidine (ZANAFLEX) 4 MG tablet Take 1 tablet (4 mg total) by mouth every 6 (six) hours as needed for muscle spasms. 30 tablet 0   albuterol (VENTOLIN HFA) 108 (90 Base) MCG/ACT inhaler Inhale 1-2 puffs into the lungs every 6 (six) hours as needed for wheezing or shortness of breath. (Patient not taking: Reported on 02/15/2022) 1 each 0   mupirocin ointment (BACTROBAN) 2 % Apply 1 application topically 2 (two) times  daily. 30 g 2   No current facility-administered medications for this visit.    Allergies as of 05/09/2022 - Review Complete 05/09/2022  Allergen Reaction Noted   Iodine Itching, Rash, and Other (See Comments) 06/20/2015    Vitals: BP 131/74 (BP Location: Right Arm, Patient Position: Sitting)   Pulse 65   Ht 5\' 8"  (1.727 m)   Wt 196 lb (88.9 kg)   LMP 09/06/2014 (Approximate)   BMI 29.80 kg/m  Last Weight:  Wt Readings from Last 1 Encounters:  05/09/22 196 lb (88.9 kg)   Last Height:   Ht Readings from Last 1 Encounters:  05/09/22 5\' 8"  (1.727 m)     Physical exam: NO CHANGES REPEATED Exam: Gen: NAD, conversant, well nourised, overwight, well groomed                     CV: RRR, no MRG. No Carotid Bruits. No peripheral edema, warm, nontender Eyes: Conjunctivae clear without exudates or hemorrhage  Neuro: NO CHANGES REPEATED Detailed Neurologic Exam  Speech:    Speech is normal; fluent and spontaneous with normal comprehension.  Cognition:    The patient is oriented to person, place, and time;     recent and remote memory intact;     language fluent;     normal attention, concentration,     fund of knowledge Cranial Nerves:    The pupils are equal, round, and reactive to light. The fundi are flat. Visual fields are full  to finger confrontation. Extraocular movements are intact. Trigeminal sensation is intact and the muscles of mastication are normal. The face is symmetric. The palate elevates in the midline. Hearing intact. Voice is normal. Shoulder shrug is normal. The tongue has normal motion without fasciculations.   Coordination:    Normal   Gait:    normal.   Motor Observation:    No asymmetry, no atrophy, and no involuntary movements noted. Tone:    Normal muscle tone.    Posture:    Posture is normal. normal erect    Strength:    Strength is V/V in the upper and lower limbs.      Sensation: intact to LT. Decreased slightly to temperature dista;;y in the feet, intact to pin prick, vibration     Reflex Exam:  DTR's:  Trace AJs otherwise  Deep tendon reflexes in the upper and lower extremities are normal bilaterally.   Toes:    The toes are downgoing bilaterally.   Clonus:    Clonus is absent.    Assessment/Plan:  A very nice patient with cold feet. Patient is here after having a biopsy with podiatry.  Patient was told that the biopsy was positive for small fiber neuropathy.  We discussed all the causes of small fiber neuropathy, and in detail we discussed options for her since initial testing showed no cause for small fiber neuropathy. She agreed to a 2-hour glucose tolerance test for pre-diabetes and a familial amyloidosis test.   -emg/ncs ordered  - Genetic panel (80 genes + familial amyloidosis) for hereditary - she got the test bit did not do it yet, we helped her today - 2-hour glucose tolerance test for pre-diabetes(she did not want a 3-hour test). neg -May consider daily alpha lipoic acid which is an antioxidant that may reduce free radical oxidative stress associated with diabetic polyneuropathy, existing evidence suggests that alpha lipoic acid significantly reduces stabbing, lancinating and burning pain and diabetic neuropathy with its onset of action as early  as 1-2 weeks.  400-600mg /day  Orders Placed This Encounter  Procedures   NCV with EMG(electromyography)   Cc: Fenton Foy, NP,  Fenton Foy, NP  Sarina Ill, MD  [facility] [address] [place], [place] [address]  Phone [phone] Fax [phone]  I spent over 40 minutes of face-to-face and non-face-to-face time with patient on the  1. Small fiber neuropathy   2. Peripheral polyneuropathy     diagnosis.  This included previsit chart review, lab review, study review, order entry, electronic health record documentation, patient education on the different diagnostic and therapeutic options, counseling and coordination of care, risks and benefits of management, compliance, or risk factor reduction

## 2022-05-17 ENCOUNTER — Ambulatory Visit: Payer: Self-pay | Admitting: Nurse Practitioner

## 2022-05-23 ENCOUNTER — Other Ambulatory Visit (HOSPITAL_COMMUNITY): Payer: Self-pay

## 2022-05-23 MED ORDER — SYNTHROID 25 MCG PO TABS
25.0000 ug | ORAL_TABLET | Freq: Every day | ORAL | 5 refills | Status: DC
Start: 2022-05-23 — End: 2023-11-28
  Filled 2022-05-23: qty 30, 30d supply, fill #0

## 2022-05-24 ENCOUNTER — Ambulatory Visit: Payer: BC Managed Care – PPO | Admitting: Nurse Practitioner

## 2022-05-24 VITALS — BP 125/70 | HR 69 | Ht 68.0 in | Wt 198.0 lb

## 2022-05-24 DIAGNOSIS — Z13220 Encounter for screening for lipoid disorders: Secondary | ICD-10-CM | POA: Diagnosis not present

## 2022-05-24 DIAGNOSIS — Z1231 Encounter for screening mammogram for malignant neoplasm of breast: Secondary | ICD-10-CM | POA: Insufficient documentation

## 2022-05-24 DIAGNOSIS — Z0000 Encounter for general adult medical examination without abnormal findings: Secondary | ICD-10-CM | POA: Diagnosis not present

## 2022-05-24 NOTE — Assessment & Plan Note (Signed)
-   MM Digital Screening  2. Lipid screening  - Lipid Panel  3. Routine health maintenance  - CBC - Comprehensive metabolic panel  Follow up:  Follow up in 3 months

## 2022-05-24 NOTE — Progress Notes (Signed)
@Patient  ID: Madison Walker, female    DOB: 05-Jun-1965, 57 y.o.   MRN: [id number]  Chief Complaint  Patient presents with   Follow-up    Referring provider: Ivonne Andrew, NP  HPI  57 year old female with history of hypothyroidism, Hashimoto's thyroiditis, chest pain, small fiber neuropathy, heart murmur.    Patient presents today for a routine follow-up.  Overall she has been doing well since her last visit here. Patient sees endocrinology for thyroid. Denies f/c/s, n/v/d, hemoptysis, PND, leg swelling. Denies chest pain or edema.       Allergies  Allergen Reactions   Iodine Itching, Rash and Other (See Comments)    Caused rash/hives after a surgery    Immunization History  Administered Date(s) Administered   Influenza Whole 01/16/2018   Influenza,inj,Quad PF,6+ Mos 12/13/2016, 12/30/2019   Influenza-Unspecified 12/23/2019   PFIZER(Purple Top)SARS-COV-2 Vaccination 09/12/2019, 10/03/2019   Tdap 06/28/2004    Past Medical History:  Diagnosis Date   Heart murmur    Hypothyroidism    Varicose veins of bilateral lower extremities with other complications     Tobacco History: Social History   Tobacco Use  Smoking Status Never  Smokeless Tobacco Never   Counseling given: Not Answered   Outpatient Encounter Medications as of 05/24/2022  Medication Sig   aspirin EC 81 MG tablet Take 81 mg by mouth daily. Swallow whole.   clobetasol (TEMOVATE) 0.05 % external solution Apply topically 2 (two) times daily. (Patient taking differently: Apply topically 2 (two) times daily. As needed)   fluticasone (FLONASE) 50 MCG/ACT nasal spray Place 1 spray into both nostrils daily. (Patient taking differently: Place 1 spray into both nostrils as needed.)   Omega-3 Fatty Acids (OMEGA 3 PO) Take by mouth.   SYNTHROID 25 MCG tablet Take 1 tablet (25 mcg total) by mouth daily at 0600   tiZANidine (ZANAFLEX) 4 MG tablet Take 1 tablet (4 mg total) by mouth every 6 (six) hours as needed  for muscle spasms.   albuterol (VENTOLIN HFA) 108 (90 Base) MCG/ACT inhaler Inhale 1-2 puffs into the lungs every 6 (six) hours as needed for wheezing or shortness of breath. (Patient not taking: Reported on 02/15/2022)   fluticasone (CUTIVATE) 0.05 % cream as needed. (Patient not taking: Reported on 05/24/2022)   mupirocin ointment (BACTROBAN) 2 % Apply 1 application topically 2 (two) times daily. (Patient not taking: Reported on 05/24/2022)   No facility-administered encounter medications on file as of 05/24/2022.     Review of Systems  Review of Systems  Constitutional: Negative.   HENT: Negative.    Cardiovascular: Negative.   Gastrointestinal: Negative.   Allergic/Immunologic: Negative.   Neurological: Negative.   Psychiatric/Behavioral: Negative.         Physical Exam  BP 125/70   Pulse 69   Ht 5\' 8"  (1.727 m)   Wt 198 lb (89.8 kg)   LMP 09/06/2014 (Approximate)   SpO2 98%   BMI 30.11 kg/m   Wt Readings from Last 5 Encounters:  05/24/22 198 lb (89.8 kg)  05/09/22 196 lb (88.9 kg)  02/15/22 200 lb (90.7 kg)  11/08/21 202 lb 12.8 oz (92 kg)  10/03/21 200 lb (90.7 kg)     Physical Exam Vitals and nursing note reviewed.  Constitutional:      General: She is not in acute distress.    Appearance: She is well-developed.  Cardiovascular:     Rate and Rhythm: Normal rate and regular rhythm.  Pulmonary:     Effort: Pulmonary  effort is normal.     Breath sounds: Normal breath sounds.  Neurological:     Mental Status: She is alert and oriented to person, place, and time.       Assessment & Plan:   Encounter for screening mammogram for malignant neoplasm of breast - MM Digital Screening  2. Lipid screening  - Lipid Panel  3. Routine health maintenance  - CBC - Comprehensive metabolic panel  Follow up:  Follow up in 3 months     Ivonne Andrewonya S Bernadean Saling, NP 05/24/2022

## 2022-05-24 NOTE — Patient Instructions (Signed)
1. Encounter for screening mammogram for malignant neoplasm of breast  - MM Digital Screening  2. Lipid screening  - Lipid Panel  3. Routine health maintenance  - CBC - Comprehensive metabolic panel  Follow up:  Follow up in 3 months

## 2022-05-25 LAB — COMPREHENSIVE METABOLIC PANEL
ALT: 28 IU/L (ref 0–32)
AST: 26 IU/L (ref 0–40)
Albumin/Globulin Ratio: 1.9 (ref 1.2–2.2)
Albumin: 4.5 g/dL (ref 3.8–4.9)
Alkaline Phosphatase: 118 IU/L (ref 44–121)
BUN/Creatinine Ratio: 28 — ABNORMAL HIGH (ref 9–23)
BUN: 19 mg/dL (ref 6–24)
Bilirubin Total: 0.3 mg/dL (ref 0.0–1.2)
CO2: 21 mmol/L (ref 20–29)
Calcium: 9.3 mg/dL (ref 8.7–10.2)
Chloride: 105 mmol/L (ref 96–106)
Creatinine, Ser: 0.69 mg/dL (ref 0.57–1.00)
Globulin, Total: 2.4 g/dL (ref 1.5–4.5)
Glucose: 84 mg/dL (ref 70–99)
Potassium: 4.6 mmol/L (ref 3.5–5.2)
Sodium: 140 mmol/L (ref 134–144)
Total Protein: 6.9 g/dL (ref 6.0–8.5)
eGFR: 101 mL/min/{1.73_m2} (ref >59)

## 2022-05-25 LAB — CBC
Hematocrit: 38.1 % (ref 34.0–46.6)
Hemoglobin: 13.1 g/dL (ref 11.1–15.9)
MCH: 33.4 pg — ABNORMAL HIGH (ref 26.6–33.0)
MCHC: 34.4 g/dL (ref 31.5–35.7)
MCV: 97 fL (ref 79–97)
Platelets: 201 10*3/uL (ref 150–450)
RBC: 3.92 x10E6/uL (ref 3.77–5.28)
RDW: 12.2 % (ref 11.7–15.4)
WBC: 5.7 10*3/uL (ref 3.4–10.8)

## 2022-05-25 LAB — LIPID PANEL
Chol/HDL Ratio: 3.8 ratio (ref 0.0–4.4)
Cholesterol, Total: 160 mg/dL (ref 100–199)
HDL: 42 mg/dL (ref >39)
LDL Chol Calc (NIH): 98 mg/dL (ref 0–99)
Triglycerides: 111 mg/dL (ref 0–149)
VLDL Cholesterol Cal: 20 mg/dL (ref 5–40)

## 2022-05-26 ENCOUNTER — Other Ambulatory Visit (HOSPITAL_COMMUNITY): Payer: Self-pay

## 2022-06-14 ENCOUNTER — Encounter: Payer: Self-pay | Admitting: *Deleted

## 2022-06-21 NOTE — Telephone Encounter (Signed)
Spoke with patient today and updated her that new order had to be placed but [facility] says she can use her current sample on file. Pt has a new address which we have on file. I was able to contact [facility] and get her address on file updated with them. I placed a new order for the neuropathies panel on the [facility] portal and sent a message for them to transfer over the sample to the new requisition.

## 2022-06-27 ENCOUNTER — Telehealth: Payer: Self-pay | Admitting: *Deleted

## 2022-06-27 NOTE — Telephone Encounter (Signed)
Noted, thank you for letting her know

## 2022-07-25 ENCOUNTER — Encounter: Payer: Self-pay | Admitting: Neurology

## 2022-08-02 ENCOUNTER — Encounter: Payer: BC Managed Care – PPO | Admitting: Neurology

## 2022-08-06 ENCOUNTER — Ambulatory Visit: Payer: Self-pay | Admitting: Neurology

## 2022-08-06 ENCOUNTER — Ambulatory Visit (INDEPENDENT_AMBULATORY_CARE_PROVIDER_SITE_OTHER): Payer: BC Managed Care – PPO | Admitting: Neurology

## 2022-08-06 DIAGNOSIS — G608 Other hereditary and idiopathic neuropathies: Secondary | ICD-10-CM | POA: Diagnosis not present

## 2022-08-06 DIAGNOSIS — Z0289 Encounter for other administrative examinations: Secondary | ICD-10-CM

## 2022-08-06 DIAGNOSIS — G629 Polyneuropathy, unspecified: Secondary | ICD-10-CM

## 2022-08-06 MED ORDER — GABAPENTIN 100 MG PO CAPS: 100.0000 mg | ORAL_CAPSULE | Freq: Three times a day (TID) | ORAL | 6 refills | Status: DC | PRN

## 2022-08-06 NOTE — Patient Instructions (Addendum)
Gabapentin as needed Could always see if [facility] has more answers, ask endocrinology to refer you if you like a second pair of eyes is always good  Gabapentin Capsules or Tablets What is this medication? GABAPENTIN (GA ba pen tin) treats nerve pain. It may also be used to prevent and control seizures in people with epilepsy. It works by calming overactive nerves in your body. This medicine may be used for other purposes; ask your health care provider or pharmacist if you have questions. COMMON BRAND NAME(S): Active-PAC with Gabapentin, Ascencion Dike, Gralise, Neurontin What should I tell my care team before I take this medication? They need to know if you have any of these conditions: Kidney disease Lung or breathing disease Substance use disorder Suicidal thoughts, plans, or attempt by you or a family member An unusual or allergic reaction to gabapentin, other medications, foods, dyes, or preservatives Pregnant or trying to get pregnant Breastfeeding How should I use this medication? Take this medication by mouth with a glass of water. Follow the directions on the prescription label. You can take it with or without food. If it upsets your stomach, take it with food. Take your medication at regular intervals. Do not take it more often than directed. Do not stop taking except on your care team's advice. If you are directed to break the 600 or 800 mg tablets in half as part of your dose, the extra half tablet should be used for the next dose. If you have not used the extra half tablet within 28 days, it should be thrown away. A special MedGuide will be given to you by the pharmacist with each prescription and refill. Be sure to read this information carefully each time. Talk to your care team about the use of this medication in children. While this medication may be prescribed for children as young as 3 years for selected conditions, precautions do apply. Overdosage: If you think you have taken too  much of this medicine contact a poison control center or emergency room at once. NOTE: This medicine is only for you. Do not share this medicine with others. What if I miss a dose? If you miss a dose, take it as soon as you can. If it is almost time for your next dose, take only that dose. Do not take double or extra doses. What may interact with this medication? Alcohol Antihistamines for allergy, cough, and cold Certain medications for anxiety or sleep Certain medications for depression like amitriptyline, fluoxetine, sertraline Certain medications for seizures like phenobarbital, primidone Certain medications for stomach problems General anesthetics like halothane, isoflurane, methoxyflurane, propofol Local anesthetics like lidocaine, pramoxine, tetracaine Medications that relax muscles for surgery Opioid medications for pain Phenothiazines like chlorpromazine, mesoridazine, prochlorperazine, thioridazine This list may not describe all possible interactions. Give your health care provider a list of all the medicines, herbs, non-prescription drugs, or dietary supplements you use. Also tell them if you smoke, drink alcohol, or use illegal drugs. Some items may interact with your medicine. What should I watch for while using this medication? Visit your care team for regular checks on your progress. You may want to keep a record at home of how you feel your condition is responding to treatment. You may want to share this information with your care team at each visit. You should contact your care team if your seizures get worse or if you have any new types of seizures. Do not stop taking this medication or any of your seizure medications  unless instructed by your care team. Stopping your medication suddenly can increase your seizures or their severity. This medication may cause serious skin reactions. They can happen weeks to months after starting the medication. Contact your care team right away if  you notice fevers or flu-like symptoms with a rash. The rash may be red or purple and then turn into blisters or peeling of the skin. Or, you might notice a red rash with swelling of the face, lips or lymph nodes in your neck or under your arms. Wear a medical identification bracelet or chain if you are taking this medication for seizures. Carry a card that lists all your medications. This medication may affect your coordination, reaction time, or judgment. Do not drive or operate machinery until you know how this medication affects you. Sit up or stand slowly to reduce the risk of dizzy or fainting spells. Drinking alcohol with this medication can increase the risk of these side effects. Your mouth may get dry. Chewing sugarless gum or sucking hard candy, and drinking plenty of water may help. Watch for new or worsening thoughts of suicide or depression. This includes sudden changes in mood, behaviors, or thoughts. These changes can happen at any time but are more common in the beginning of treatment or after a change in dose. Call your care team right away if you experience these thoughts or worsening depression. If you become pregnant while using this medication, you may enroll in the [place] American Antiepileptic Drug Pregnancy Registry by calling [phone]) [phone]. This registry collects information about the safety of antiepileptic medication use during pregnancy. What side effects may I notice from receiving this medication? Side effects that you should report to your care team as soon as possible: Allergic reactions or angioedema--skin rash, itching, hives, swelling of the face, eyes, lips, tongue, arms, or legs, trouble swallowing or breathing Rash, fever, and swollen lymph nodes Thoughts of suicide or self harm, worsening mood, feelings of depression Trouble breathing Unusual changes in mood or behavior in children after use such as difficulty concentrating, hostility, or restlessness Side effects  that usually do not require medical attention (report to your care team if they continue or are bothersome): Dizziness Drowsiness Nausea Swelling of ankles, feet, or hands Vomiting This list may not describe all possible side effects. Call your doctor for medical advice about side effects. You may report side effects to FDA at 1-800-FDA-1088. Where should I keep my medication? Keep out of reach of children and pets. Store at room temperature between 15 and 30 degrees C (59 and 86 degrees F). Get rid of any unused medication after the expiration date. This medication may cause accidental overdose and death if taken by other adults, children, or pets. To get rid of medications that are no longer needed or have expired: Take the medication to a medication take-back program. Check with your pharmacy or law enforcement to find a location. If you cannot return the medication, check the label or package insert to see if the medication should be thrown out in the garbage or flushed down the toilet. If you are not sure, ask your care team. If it is safe to put it in the trash, empty the medication out of the container. Mix the medication with cat litter, dirt, coffee grounds, or other unwanted substance. Seal the mixture in a bag or container. Put it in the trash. NOTE: This sheet is a summary. It may not cover all possible information. If you have questions about this  medicine, talk to your doctor, pharmacist, or health care provider.  2024 Elsevier/Gold Standard (2021-11-14 00:00:00)

## 2022-08-06 NOTE — Progress Notes (Signed)
Full Name: Madison Walker Gender: Female MRN #: [id number] Date of Birth: 1965/10/30    Visit Date: 08/06/2022 13:09 Age: 57 Years Examining Physician: Dr. Naomie Dean Referring Physician: Dr. Naomie Dean Height: 5 feet 8 inch    History: Patient with a history of cold feet and needle prick feeling in her big toe bilaterally. She has left-sided lower extremity distal edema, she reports aches and pains in the legs. No back pain or radicular symptoms.   Summary: The right sural sensory nerve showed reduced amplitude (4 V, normal greater than 6). The left sural sensory nerve showed reduced amplitude (3 V, normal greater than 6). The right superficial peroneal sensory nerve showed reduced amplitude (4 V, normal greater than 6) The left superficial peroneal sensory nerve showed reduced amplitude 2 V, normal greater than 6). All remaining nerves (as indicated in the following tables) were within normal limits.   All muscles (as indicated in the following tables) were within normal limits.    Conclusion: There is electrophysiologic evidence for a mild sensory length-dependent axonal symmetrical neuropathy distally in the feet.  However edema could have been a technical factor. Recommend monitor clinically and repeat emg/ncs if worsens.   ------------------------------- Naomie Dean, M.D.  [facility] [address], [address] [place], [place] [address] Tel: [phone] Fax: [phone]  Verbal informed consent was obtained from the patient, patient was informed of potential risk of procedure, including bruising, bleeding, hematoma formation, infection, muscle weakness, muscle pain, numbness, among others.        MNC    Nerve / Sites Muscle Latency Ref. Amplitude Ref. Rel Amp Segments Distance Velocity Ref. Area    ms ms mV mV %  cm m/s m/s mVms  R Peroneal - EDB     Ankle EDB 4.5 ?6.5 4.9 ?2.0 100 Ankle - EDB 9   15.7     Fib head EDB 10.7  4.0  81 Fib  head - Ankle 28 45 ?44 13.5     Pop fossa EDB 12.9  3.8  95.9 Pop fossa - Fib head 11.6 52 ?44 13.2         Pop fossa - Ankle      L Peroneal - EDB     Ankle EDB 5.0 ?6.5 3.5 ?2.0 100 Ankle - EDB 9   11.5     Fib head EDB 11.0  3.3  94 Fib head - Ankle 26.6 44 ?44 11.3     Pop fossa EDB 13.0  3.6  109 Pop fossa - Fib head 9 45 ?44 12.1         Pop fossa - Ankle      R Tibial - AH     Ankle AH 3.6 ?5.8 5.5 ?4.0 100 Ankle - AH 9   13.9     Pop fossa AH 12.4  2.9  52.4 Pop fossa - Ankle 36 41 ?41 11.9  L Tibial - AH     Ankle AH 2.7 ?5.8 4.5 ?4.0 100 Ankle - AH 9   9.6     Pop fossa AH 11.9  2.7  59.9 Pop fossa - Ankle 38 41 ?41 10.4             SNC    Nerve / Sites Rec. Site Peak Lat Ref.  Amp Ref. Segments Distance    ms ms V V  cm  R Radial - Anatomical snuff box (Forearm)     Forearm Wrist  2.5 ?2.9 36 ?15 Forearm - Wrist 10  R Sural - Ankle (Calf)     Calf Ankle 3.3 ?4.4 4 ?6 Calf - Ankle 14  L Sural - Ankle (Calf)     Calf Ankle 2.5 ?4.4 3 ?6 Calf - Ankle 14  R Superficial peroneal - Ankle     Lat leg Ankle 4.0 ?4.4 4 ?6 Lat leg - Ankle 14  L Superficial peroneal - Ankle     Lat leg Ankle 3.9 ?4.4 2 ?6 Lat leg - Ankle 14                F  Wave    Nerve F Lat Ref.   ms ms  R Tibial - AH 38.2 ?56.0  L Tibial - AH 45.8 ?56.0         H Reflex    Nerve H Lat Lat Hmax   ms ms   Left Right Ref. Left Right Ref.  Tibial - Soleus 34.6 33.1 ?35.0 39.2 39.1 ?35.0         EMG Summary Table    Spontaneous MUAP Recruitment  Muscle IA Fib PSW Fasc Other Amp Dur. Poly Pattern  R. Lumbar paraspinals (low) Normal None None None _______ Normal Normal Normal Normal  R. Vastus medialis Normal None None None _______ Normal Normal Normal Normal  R. Tibialis anterior Normal None None None _______ Normal Normal Normal Normal  R. Gastrocnemius (Medial head) Normal None None None _______ Normal Normal Normal Normal  R. Extensor hallucis longus Normal None None None _______ Normal Normal  Normal Normal  R. Abductor hallucis Normal None None None _______ Normal Normal Normal Normal

## 2022-08-07 DIAGNOSIS — G608 Other hereditary and idiopathic neuropathies: Secondary | ICD-10-CM | POA: Insufficient documentation

## 2022-08-07 NOTE — Procedures (Signed)
Full Name: Madison Walker Gender: Female MRN #: [id number] Date of Birth: 1965/10/30    Visit Date: 08/06/2022 13:09 Age: 57 Years Examining Physician: Dr. Naomie Dean Referring Physician: Dr. Naomie Dean Height: 5 feet 8 inch    History: Patient with a history of cold feet and needle prick feeling in her big toe bilaterally. She has left-sided lower extremity distal edema, she reports aches and pains in the legs. No back pain or radicular symptoms.   Summary: The right sural sensory nerve showed reduced amplitude (4 V, normal greater than 6). The left sural sensory nerve showed reduced amplitude (3 V, normal greater than 6). The right superficial peroneal sensory nerve showed reduced amplitude (4 V, normal greater than 6) The left superficial peroneal sensory nerve showed reduced amplitude 2 V, normal greater than 6). All remaining nerves (as indicated in the following tables) were within normal limits.   All muscles (as indicated in the following tables) were within normal limits.    Conclusion: There is electrophysiologic evidence for a mild sensory length-dependent axonal symmetrical neuropathy distally in the feet.  However edema could have been a technical factor. Recommend monitor clinically and repeat emg/ncs if worsens.   ------------------------------- Naomie Dean, M.D.  [facility] [address], [address] [place], [place] [address] Tel: [phone] Fax: [phone]  Verbal informed consent was obtained from the patient, patient was informed of potential risk of procedure, including bruising, bleeding, hematoma formation, infection, muscle weakness, muscle pain, numbness, among others.        MNC    Nerve / Sites Muscle Latency Ref. Amplitude Ref. Rel Amp Segments Distance Velocity Ref. Area    ms ms mV mV %  cm m/s m/s mVms  R Peroneal - EDB     Ankle EDB 4.5 ?6.5 4.9 ?2.0 100 Ankle - EDB 9   15.7     Fib head EDB 10.7  4.0  81 Fib  head - Ankle 28 45 ?44 13.5     Pop fossa EDB 12.9  3.8  95.9 Pop fossa - Fib head 11.6 52 ?44 13.2         Pop fossa - Ankle      L Peroneal - EDB     Ankle EDB 5.0 ?6.5 3.5 ?2.0 100 Ankle - EDB 9   11.5     Fib head EDB 11.0  3.3  94 Fib head - Ankle 26.6 44 ?44 11.3     Pop fossa EDB 13.0  3.6  109 Pop fossa - Fib head 9 45 ?44 12.1         Pop fossa - Ankle      R Tibial - AH     Ankle AH 3.6 ?5.8 5.5 ?4.0 100 Ankle - AH 9   13.9     Pop fossa AH 12.4  2.9  52.4 Pop fossa - Ankle 36 41 ?41 11.9  L Tibial - AH     Ankle AH 2.7 ?5.8 4.5 ?4.0 100 Ankle - AH 9   9.6     Pop fossa AH 11.9  2.7  59.9 Pop fossa - Ankle 38 41 ?41 10.4             SNC    Nerve / Sites Rec. Site Peak Lat Ref.  Amp Ref. Segments Distance    ms ms V V  cm  R Radial - Anatomical snuff box (Forearm)     Forearm Wrist  2.5 ?2.9 36 ?15 Forearm - Wrist 10  R Sural - Ankle (Calf)     Calf Ankle 3.3 ?4.4 4 ?6 Calf - Ankle 14  L Sural - Ankle (Calf)     Calf Ankle 2.5 ?4.4 3 ?6 Calf - Ankle 14  R Superficial peroneal - Ankle     Lat leg Ankle 4.0 ?4.4 4 ?6 Lat leg - Ankle 14  L Superficial peroneal - Ankle     Lat leg Ankle 3.9 ?4.4 2 ?6 Lat leg - Ankle 14                F  Wave    Nerve F Lat Ref.   ms ms  R Tibial - AH 38.2 ?56.0  L Tibial - AH 45.8 ?56.0         H Reflex    Nerve H Lat Lat Hmax   ms ms   Left Right Ref. Left Right Ref.  Tibial - Soleus 34.6 33.1 ?35.0 39.2 39.1 ?35.0         EMG Summary Table    Spontaneous MUAP Recruitment  Muscle IA Fib PSW Fasc Other Amp Dur. Poly Pattern  R. Lumbar paraspinals (low) Normal None None None _______ Normal Normal Normal Normal  R. Vastus medialis Normal None None None _______ Normal Normal Normal Normal  R. Tibialis anterior Normal None None None _______ Normal Normal Normal Normal  R. Gastrocnemius (Medial head) Normal None None None _______ Normal Normal Normal Normal  R. Extensor hallucis longus Normal None None None _______ Normal Normal  Normal Normal  R. Abductor hallucis Normal None None None _______ Normal Normal Normal Normal

## 2022-08-23 ENCOUNTER — Ambulatory Visit: Payer: Self-pay | Admitting: Nurse Practitioner

## 2022-09-07 ENCOUNTER — Ambulatory Visit: Payer: Self-pay | Admitting: Nurse Practitioner

## 2022-09-17 ENCOUNTER — Ambulatory Visit: Payer: BC Managed Care – PPO | Admitting: Nurse Practitioner

## 2022-09-17 ENCOUNTER — Encounter: Payer: Self-pay | Admitting: Nurse Practitioner

## 2022-09-17 VITALS — BP 118/74 | HR 62 | Resp 16 | Wt 193.6 lb

## 2022-09-17 DIAGNOSIS — Z131 Encounter for screening for diabetes mellitus: Secondary | ICD-10-CM

## 2022-09-17 DIAGNOSIS — M545 Low back pain, unspecified: Secondary | ICD-10-CM

## 2022-09-17 LAB — POCT GLYCOSYLATED HEMOGLOBIN (HGB A1C): Hemoglobin A1C: 5.2 % (ref 4.0–5.6)

## 2022-09-17 MED ORDER — TIZANIDINE HCL 2 MG PO CAPS: 2.0000 mg | ORAL_CAPSULE | Freq: Three times a day (TID) | ORAL | 2 refills | Status: DC

## 2022-09-17 NOTE — Progress Notes (Signed)
@Patient  ID: Madison Walker, female    DOB: 1965/03/04, 57 y.o.   MRN: [id number]  Chief Complaint  Patient presents with   Follow-up   Back Pain    Referring provider: Ivonne Andrew, NP   HPI  57 year old female with history of hypothyroidism, Hashimoto's thyroiditis, chest pain, small fiber neuropathy, heart murmur.   Patient presents today for a routine follow-up.  Overall she has been doing well since her last visit here. Patient sees endocrinology for thyroid.   Patient states that she has been experiencing back pain for the past 2 months.  She does states that low back has been hurting.  She does work long hours standing.  We will trial Zanaflex. Denies f/c/s, n/v/d, hemoptysis, PND, leg swelling Denies chest pain or edema    Allergies  Allergen Reactions   Iodine Itching, Rash and Other (See Comments)    Caused rash/hives after a surgery    Immunization History  Administered Date(s) Administered   Influenza Whole 01/16/2018   Influenza,inj,Quad PF,6+ Mos 12/13/2016, 12/30/2019   Influenza-Unspecified 12/23/2019   PFIZER(Purple Top)SARS-COV-2 Vaccination 09/12/2019, 10/03/2019   Tdap 06/28/2004    Past Medical History:  Diagnosis Date   Heart murmur    Hypothyroidism    Varicose veins of bilateral lower extremities with other complications     Tobacco History: Social History   Tobacco Use  Smoking Status Never  Smokeless Tobacco Never   Counseling given: Not Answered   Outpatient Encounter Medications as of 09/17/2022  Medication Sig   tizanidine (ZANAFLEX) 2 MG capsule Take 1 capsule (2 mg total) by mouth 3 (three) times daily.   aspirin EC 81 MG tablet Take 81 mg by mouth daily. Swallow whole.   clobetasol (TEMOVATE) 0.05 % external solution Apply topically 2 (two) times daily. (Patient taking differently: Apply topically 2 (two) times daily. As needed)   fluticasone (FLONASE) 50 MCG/ACT nasal spray Place 1 spray into both nostrils daily. (Patient  taking differently: Place 1 spray into both nostrils as needed.)   gabapentin (NEURONTIN) 100 MG capsule Take 1 capsule (100 mg total) by mouth 3 (three) times daily as needed. (Patient not taking: Reported on 09/17/2022)   Omega-3 Fatty Acids (OMEGA 3 PO) Take by mouth.   SYNTHROID 25 MCG tablet Take 1 tablet (25 mcg total) by mouth daily at 0600   [DISCONTINUED] tiZANidine (ZANAFLEX) 4 MG tablet Take 1 tablet (4 mg total) by mouth every 6 (six) hours as needed for muscle spasms.   No facility-administered encounter medications on file as of 09/17/2022.     Review of Systems  Review of Systems  Constitutional: Negative.   HENT: Negative.    Cardiovascular: Negative.   Gastrointestinal: Negative.   Musculoskeletal:  Positive for back pain.  Allergic/Immunologic: Negative.   Neurological: Negative.   Psychiatric/Behavioral: Negative.         Physical Exam  BP 118/74 (BP Location: Left Arm, Patient Position: Sitting, Cuff Size: Normal)   Pulse 62   Resp 16   Wt 193 lb 9.6 oz (87.8 kg)   LMP 09/06/2014 (Approximate)   SpO2 98%   BMI 29.44 kg/m   Wt Readings from Last 5 Encounters:  09/17/22 193 lb 9.6 oz (87.8 kg)  05/24/22 198 lb (89.8 kg)  05/09/22 196 lb (88.9 kg)  02/15/22 200 lb (90.7 kg)  11/08/21 202 lb 12.8 oz (92 kg)     Physical Exam Vitals and nursing note reviewed.  Constitutional:      General: She  is not in acute distress.    Appearance: She is well-developed.  Cardiovascular:     Rate and Rhythm: Normal rate and regular rhythm.  Pulmonary:     Effort: Pulmonary effort is normal.     Breath sounds: Normal breath sounds.  Neurological:     Mental Status: She is alert and oriented to person, place, and time.      Lab Results:  CBC    Component Value Date/Time   WBC 5.7 05/24/2022 1110   WBC 5.6 10/03/2021 1322   RBC 3.92 05/24/2022 1110   RBC 3.86 (L) 10/03/2021 1322   HGB 13.1 05/24/2022 1110   HCT 38.1 05/24/2022 1110   PLT 201 05/24/2022  1110   MCV 97 05/24/2022 1110   MCH 33.4 (H) 05/24/2022 1110   MCH 33.9 10/03/2021 1322   MCHC 34.4 05/24/2022 1110   MCHC 33.3 10/03/2021 1322   RDW 12.2 05/24/2022 1110   LYMPHSABS 2.1 12/07/2020 1538   MONOABS 0.7 09/26/2013 1322   EOSABS 0.2 12/07/2020 1538   BASOSABS 0.0 12/07/2020 1538    BMET    Component Value Date/Time   NA 140 05/24/2022 1110   K 4.6 05/24/2022 1110   CL 105 05/24/2022 1110   CO2 21 05/24/2022 1110   GLUCOSE 84 05/24/2022 1110   GLUCOSE 107 (H) 10/03/2021 1322   BUN 19 05/24/2022 1110   CREATININE 0.69 05/24/2022 1110   CALCIUM 9.3 05/24/2022 1110   GFRNONAA >60 10/03/2021 1322   GFRAA 109 08/25/2019 1157      Assessment & Plan:   Screening for diabetes mellitus (DM) - POCT glycosylated hemoglobin (Hb A1C)   2. Acute bilateral low back pain without sciatica - will trial tizanadine   Follow up:  Follow up in 3 months     Ivonne Andrew, NP 10/01/2022

## 2022-09-17 NOTE — Progress Notes (Signed)
Pt presents for 3 month f/u -back pain

## 2022-09-26 DIAGNOSIS — M549 Dorsalgia, unspecified: Secondary | ICD-10-CM | POA: Diagnosis not present

## 2022-09-27 ENCOUNTER — Inpatient Hospital Stay: Admission: RE | Admit: 2022-09-27 | Payer: BC Managed Care – PPO | Source: Ambulatory Visit

## 2022-10-01 ENCOUNTER — Ambulatory Visit (HOSPITAL_BASED_OUTPATIENT_CLINIC_OR_DEPARTMENT_OTHER): Payer: BC Managed Care – PPO | Admitting: Cardiology

## 2022-10-01 ENCOUNTER — Telehealth: Payer: Self-pay | Admitting: Nurse Practitioner

## 2022-10-01 DIAGNOSIS — Z131 Encounter for screening for diabetes mellitus: Secondary | ICD-10-CM | POA: Insufficient documentation

## 2022-10-01 NOTE — Assessment & Plan Note (Signed)
-   POCT glycosylated hemoglobin (Hb A1C)   2. Acute bilateral low back pain without sciatica - will trial tizanadine   Follow up:  Follow up in 3 months

## 2022-10-01 NOTE — Progress Notes (Incomplete)
Cardiology Office Note:  .    Date:  10/01/2022  ID:  Madison Walker, DOB 07/29/1965, MRN [id number] PCP: Ivonne Andrew, NP  [facility] Providers Cardiologist:  Jodelle Red, MD { Click to update primary MD,subspecialty MD or APP then REFRESH:1}    History of Present Illness: .    Madison Walker is a 57 y.o. female with a hx of right bundle branch block, first degree AV block, chest pain, hypothyroidism, who is seen for follow-up today. I initially met her 12/18/2019 as a new patient to me/provider switch. She was previously followed by Dr. Allyson Sabal.   At her appointment 12/2019 she complained of ongoing intermittent aching chest pains, up to multiple times per day or some days not at all. Rare lightheadedness and palpitations. Saw Joni Reining 08/25/19. Had echo done after this revealing LVEF 60 to 65%, mildly dilated right atria, mild bileaflet prolapse with mild posteriorly directed MR, and mild mitral valve regurgitation. Was told her mitral valve was abnormal. We reviewed this together.   FH: brother had heart attack, had one stent put in. Was mid to late 92s at the time.    She followed up with Edd Fabian, NP on 04/19/2020 and reported increased palpitations with her first dose of levothyroxine. After discontinuing the medication her palpitations normalized. EKG at that visit showed sinus rhythm with first-degree AV block, right bundle branch block, 61 bpm; was unchanged from prior. Levothyroxine was restarted at a lower dose; currently on 25 mcg daily.  Today,  She denies any palpitations, chest pain, shortness of breath, peripheral edema, lightheadedness, headaches, syncope, orthopnea, or PND.  ROS:  Please Walker the history of present illness. ROS otherwise negative except as noted.  (+)  Studies Reviewed: .         Risk Assessment/Calculations:    {Does this patient have ATRIAL FIBRILLATION?[phone]} No BP recorded.  {Refresh Note OR Click here to enter  BP  :1}***       Physical Exam:    VS:  LMP 09/06/2014 (Approximate)    Wt Readings from Last 3 Encounters:  09/17/22 193 lb 9.6 oz (87.8 kg)  05/24/22 198 lb (89.8 kg)  05/09/22 196 lb (88.9 kg)    GEN: Well nourished, well developed in no acute distress HEENT: Normal, moist mucous membranes NECK: No JVD CARDIAC: regular rhythm, normal S1 and S2, no rubs or gallops. ***No murmur. VASCULAR: Radial and DP pulses 2+ bilaterally. No carotid bruits RESPIRATORY:  Clear to auscultation without rales, wheezing or rhonchi  ABDOMEN: Soft, non-tender, non-distended MUSCULOSKELETAL:  Ambulates independently SKIN: Warm and dry, no edema NEUROLOGIC:  Alert and oriented x 3. No focal neuro deficits noted. PSYCHIATRIC:  Normal affect   ASSESSMENT AND PLAN: .    Atypical chest pain: -reviewed prior workup, reassuring -very low ASCVD risk -reviewed red flag warning signs that need immediate medical attention   Abnormal echo: -mild mitral abnormalities, discussed today   Cardiac risk counseling and prevention recommendations: in the setting of family history of CAD -recommend heart healthy/Mediterranean diet, with whole grains, fruits, vegetable, fish, lean meats, nuts, and olive oil. Limit salt. -recommend moderate walking, 3-5 times/week for 30-50 minutes each session. Aim for at least 150 minutes.week. Goal should be pace of 3 miles/hours, or walking 1.5 miles in 30 minutes -recommend avoidance of tobacco products. Avoid excess alcohol. -ASCVD risk score: The 10-year ASCVD risk score Denman George DC Jr., et al., 2013) is: 1.8%   Values used to calculate the score:  Age: 68 years     Sex: Female     Is Non-Hispanic African American: No     Diabetic: No     Tobacco smoker: No     Systolic Blood Pressure: 122 mmHg     Is BP treated: No     HDL Cholesterol: 45 mg/dL     Total Cholesterol: 152 mg/dL       {Are you ordering a CV Procedure (e.g. stress test, cath, DCCV, TEE, etc)?   Press F2         :[id number]}  Dispo: Follow-up in *** months, or sooner as needed.  I,Mathew Stumpf,acting as a Neurosurgeon for [employer], MD.,have documented all relevant documentation on the behalf of Jodelle Red, MD,as directed by  Jodelle Red, MD while in the presence of Jodelle Red, MD.  ***  Signed, Carlena Bjornstad

## 2022-10-01 NOTE — Patient Instructions (Addendum)
1. Screening for diabetes mellitus (DM)  - POCT glycosylated hemoglobin (Hb A1C)   2. Acute bilateral low back pain without sciatica - will trial tizanadine   Follow up:  Follow up in 3 months

## 2022-10-01 NOTE — Telephone Encounter (Signed)
Patient called and would like to know if provider can send an order for an MRI as she has been experiencing back pain for 5 months on and off.

## 2022-10-03 ENCOUNTER — Other Ambulatory Visit: Payer: Self-pay | Admitting: Nurse Practitioner

## 2022-10-03 DIAGNOSIS — M545 Low back pain, unspecified: Secondary | ICD-10-CM

## 2022-10-05 ENCOUNTER — Ambulatory Visit
Admission: RE | Admit: 2022-10-05 | Discharge: 2022-10-05 | Disposition: A | Discharge status: Home / Self Care | Payer: BC Managed Care – PPO | Source: Ambulatory Visit | Attending: Nurse Practitioner | Admitting: Nurse Practitioner

## 2022-10-05 DIAGNOSIS — Z1231 Encounter for screening mammogram for malignant neoplasm of breast: Secondary | ICD-10-CM | POA: Diagnosis not present

## 2022-10-05 NOTE — Telephone Encounter (Signed)
Patient left message asking for MRI again and not wanting to do the Xray. I LMOM for pt to return call to let her know insurance requires testing prior to MRI.

## 2022-10-11 DIAGNOSIS — Z1231 Encounter for screening mammogram for malignant neoplasm of breast: Secondary | ICD-10-CM | POA: Diagnosis not present

## 2022-10-25 NOTE — Telephone Encounter (Signed)
Pt is asking to get an MRI done.

## 2022-10-30 ENCOUNTER — Telehealth: Payer: Self-pay | Admitting: Nurse Practitioner

## 2022-10-30 NOTE — Telephone Encounter (Signed)
Pt called stating she needs MRI on her back not XRAY.  Please call back today. She has sent multiple mychart messages and states she has called multiple times

## 2022-11-02 NOTE — Telephone Encounter (Signed)
Patient would like a referral to ortho if you think that is best. This has been ongoing for 6 months now. It tends to hurt more when she is moving and twisting her back versus when she is sedentary.   Please place urgent referral to ortho and she will go get the xrays previously ordered.

## 2022-11-02 NOTE — Telephone Encounter (Signed)
Patient left VM that she would like referral for MRI for her back pain. She voiced frustration with having made many calls to the office and no one is responding to her.

## 2022-11-05 ENCOUNTER — Other Ambulatory Visit: Payer: Self-pay | Admitting: Nurse Practitioner

## 2022-11-05 DIAGNOSIS — M545 Other chronic pain: Secondary | ICD-10-CM

## 2022-11-07 ENCOUNTER — Ambulatory Visit (INDEPENDENT_AMBULATORY_CARE_PROVIDER_SITE_OTHER): Payer: BC Managed Care – PPO | Admitting: Physical Medicine and Rehabilitation

## 2022-11-07 ENCOUNTER — Encounter: Payer: Self-pay | Admitting: Physical Medicine and Rehabilitation

## 2022-11-07 ENCOUNTER — Other Ambulatory Visit (INDEPENDENT_AMBULATORY_CARE_PROVIDER_SITE_OTHER): Payer: BC Managed Care – PPO

## 2022-11-07 DIAGNOSIS — G8929 Other chronic pain: Secondary | ICD-10-CM | POA: Diagnosis not present

## 2022-11-07 DIAGNOSIS — M545 Low back pain, unspecified: Secondary | ICD-10-CM | POA: Diagnosis not present

## 2022-11-07 DIAGNOSIS — M7918 Myalgia, other site: Secondary | ICD-10-CM

## 2022-11-07 DIAGNOSIS — M47816 Spondylosis without myelopathy or radiculopathy, lumbar region: Secondary | ICD-10-CM | POA: Diagnosis not present

## 2022-11-07 MED ORDER — MELOXICAM 15 MG PO TABS: 15.0000 mg | ORAL_TABLET | Freq: Every day | ORAL | 0 refills | Status: DC

## 2022-11-07 MED ORDER — METHOCARBAMOL 500 MG PO TABS: 500.0000 mg | ORAL_TABLET | Freq: Three times a day (TID) | ORAL | 0 refills | Status: DC

## 2022-11-07 NOTE — Progress Notes (Signed)
Functional Pain Scale - descriptive words and definitions  Uncomfortable (3)  Pain is present but can complete all ADL's/sleep is slightly affected and passive distraction only gives marginal relief. Mild range order  Average Pain  varies   Lower back pain on right side with no radiation. Pain only when twisting the body and lifting

## 2022-11-08 ENCOUNTER — Encounter (HOSPITAL_BASED_OUTPATIENT_CLINIC_OR_DEPARTMENT_OTHER): Payer: Self-pay | Admitting: Cardiology

## 2022-11-08 ENCOUNTER — Ambulatory Visit (HOSPITAL_BASED_OUTPATIENT_CLINIC_OR_DEPARTMENT_OTHER): Payer: BC Managed Care – PPO | Admitting: Cardiology

## 2022-11-08 VITALS — BP 122/82 | HR 65 | Ht 68.0 in | Wt 199.6 lb

## 2022-11-08 DIAGNOSIS — R011 Cardiac murmur, unspecified: Secondary | ICD-10-CM

## 2022-11-08 DIAGNOSIS — R0789 Other chest pain: Secondary | ICD-10-CM

## 2022-11-08 DIAGNOSIS — Z712 Person consulting for explanation of examination or test findings: Secondary | ICD-10-CM

## 2022-11-08 DIAGNOSIS — Z7189 Other specified counseling: Secondary | ICD-10-CM | POA: Diagnosis not present

## 2022-11-08 NOTE — Patient Instructions (Signed)
Medication Instructions:  The current medical regimen is effective;  continue present plan and medications as directed. Please refer to the Current Medication list given to you today.  *If you need a refill on your cardiac medications before your next appointment, please call your pharmacy*  Lab Work: NONE If you have labs (blood work) drawn today and your tests are completely normal, you will receive your results only by: MyChart Message (if you have MyChart) OR A paper copy in the mail If you have any lab test that is abnormal or we need to change your treatment, we will call you to review the results.  Follow-Up: At [facility], you and your health needs are our priority.  As part of our continuing mission to provide you with exceptional heart care, we have created designated Provider Care Teams.  These Care Teams include your primary Cardiologist (physician) and Advanced Practice Providers (APPs -  Physician Assistants and Nurse Practitioners) who all work together to provide you with the care you need, when you need it.  Your next appointment:   2 year(s)  Provider:   Jodelle Red, MD

## 2022-11-08 NOTE — Progress Notes (Signed)
Cardiology Office Note:  .   Date:  11/08/2022  ID:  Madison Walker, DOB 03/28/1965, MRN [id number] PCP: Ivonne Andrew, NP  [facility] Providers Cardiologist:  Jodelle Red, MD {  History of Present Illness: .   Madison Walker is a 57 y.o. female with a hx of chest pain who is seen for follow up today. She was previously followed by Dr. Allyson Sabal; I met her in 2021  Today: Overall doing well. Has rare chest pain, brief, not related to exertion. Unchanged frequency. Reviewed prior workup with echo and treadmill, reassuring. Rare palpitations, mild/nonlimiting. Reviewed her lipid panel from earlier this year.  ROS: Denies shortness of breath at rest or with normal exertion. No PND, orthopnea, LE edema or unexpected weight gain. No syncope. ROS otherwise negative except as noted.   Studies Reviewed: Marland Kitchen    EKG:       Physical Exam:   VS:  BP 122/82   Pulse 65   Ht 5\' 8"  (1.727 m)   Wt 199 lb 9.6 oz (90.5 kg)   LMP 09/06/2014 (Approximate)   SpO2 98%   BMI 30.35 kg/m    Wt Readings from Last 3 Encounters:  11/08/22 199 lb 9.6 oz (90.5 kg)  09/17/22 193 lb 9.6 oz (87.8 kg)  05/24/22 198 lb (89.8 kg)    GEN: Well nourished, well developed in no acute distress HEENT: Normal, moist mucous membranes NECK: No JVD CARDIAC: regular rhythm, normal S1 and S2, no rubs or gallops. No murmur. VASCULAR: Radial and DP pulses 2+ bilaterally. No carotid bruits RESPIRATORY:  Clear to auscultation without rales, wheezing or rhonchi  ABDOMEN: Soft, non-tender, non-distended MUSCULOSKELETAL:  Ambulates independently SKIN: Warm and dry, no edema NEUROLOGIC:  Alert and oriented x 3. No focal neuro deficits noted. PSYCHIATRIC:  Normal affect    ASSESSMENT AND PLAN: .    Atypical chest pain: -reviewed prior workup, reassuring -very low ASCVD risk -reviewed red flag warning signs that need immediate medical attention  Murmur -echo with mild mitral valve prolapse, mild  regurgitation  CV risk counseling and prevention -recommend heart healthy/Mediterranean diet, with whole grains, fruits, vegetable, fish, lean meats, nuts, and olive oil. Limit salt. -recommend moderate walking, 3-5 times/week for 30-50 minutes each session. Aim for at least 150 minutes.week. Goal should be pace of 3 miles/hours, or walking 1.5 miles in 30 minutes -recommend avoidance of tobacco products. Avoid excess alcohol. -ASCVD risk score: The 10-year ASCVD risk score (Arnett DK, et al., 2019) is: 2.7%   Values used to calculate the score:     Age: 38 years     Sex: Female     Is Non-Hispanic African American: No     Diabetic: No     Tobacco smoker: No     Systolic Blood Pressure: 122 mmHg     Is BP treated: No     HDL Cholesterol: 42 mg/dL     Total Cholesterol: 160 mg/dL    Dispo: two years or sooner, per patient preference  Signed, Jodelle Red, MD   Jodelle Red, MD, PhD, [facility]  [facility]  [facility] at [facility] [address] [place], [place] [address] [phone]

## 2022-11-22 DIAGNOSIS — M545 Low back pain, unspecified: Secondary | ICD-10-CM | POA: Diagnosis not present

## 2022-12-24 ENCOUNTER — Ambulatory Visit (HOSPITAL_BASED_OUTPATIENT_CLINIC_OR_DEPARTMENT_OTHER): Payer: BC Managed Care – PPO | Admitting: Family

## 2022-12-24 ENCOUNTER — Encounter: Payer: Self-pay | Admitting: Nurse Practitioner

## 2022-12-24 ENCOUNTER — Ambulatory Visit: Payer: BC Managed Care – PPO | Admitting: Nurse Practitioner

## 2022-12-24 VITALS — BP 137/77 | HR 67 | Ht 68.0 in | Wt 201.0 lb

## 2022-12-24 DIAGNOSIS — H9202 Otalgia, left ear: Secondary | ICD-10-CM

## 2022-12-24 DIAGNOSIS — Z0000 Encounter for general adult medical examination without abnormal findings: Secondary | ICD-10-CM | POA: Diagnosis not present

## 2022-12-24 MED ORDER — OFLOXACIN 0.3 % OT SOLN
5.0000 [drp] | Freq: Every day | OTIC | 0 refills | Status: DC
Start: 2022-12-24 — End: 2023-11-25

## 2022-12-24 MED ORDER — MELOXICAM 15 MG PO TABS: 15.0000 mg | ORAL_TABLET | Freq: Every day | ORAL | 0 refills | Status: DC

## 2022-12-24 MED ORDER — CLOBETASOL PROPIONATE 0.05 % EX SOLN: Freq: Two times a day (BID) | CUTANEOUS | 0 refills | Status: DC

## 2022-12-24 NOTE — Patient Instructions (Addendum)
1. Left ear pain  - ofloxacin (FLOXIN) 0.3 % OTIC solution; Place 5 drops into the left ear daily.  Dispense: 5 mL; Refill: 0   2. Routine adult health maintenance  - CBC - Comprehensive metabolic panel   Follow up:  Follow up in 3 months

## 2022-12-24 NOTE — Progress Notes (Signed)
Subjective   Patient ID: Madison Walker, female    DOB: November 17, 1965, 57 y.o.   MRN: [id number]  Chief Complaint  Patient presents with   Follow-up    Referring provider: Ivonne Andrew, NP  Madison Walker is a 57 y.o. female with Past Medical History: No date: Heart murmur No date: Hypothyroidism No date: Varicose veins of bilateral lower extremities with other  complications   HPI  Patient presents today for a routine follow-up. Overall she has been doing well since her last visit here. Patient sees endocrinology for thyroid.   Patient does complain of ongoing back pain.  She states the pain has been improving.  Has followed with ortho for low back pain.  Patient has had physical therapy.  Patient does complain today of left ear pain.  She states this has been present for the past 3 to 4 days.    Allergies  Allergen Reactions   Iodine Itching, Rash and Other (See Comments)    Caused rash/hives after a surgery    Immunization History  Administered Date(s) Administered   Influenza Whole 01/16/2018   Influenza,inj,Quad PF,6+ Mos 12/13/2016, 12/30/2019   Influenza-Unspecified 12/23/2019   PFIZER(Purple Top)SARS-COV-2 Vaccination 09/12/2019, 10/03/2019   Tdap 06/28/2004    Tobacco History: Social History   Tobacco Use  Smoking Status Never  Smokeless Tobacco Never   Counseling given: Not Answered   Outpatient Encounter Medications as of 12/24/2022  Medication Sig   aspirin EC 81 MG tablet Take 81 mg by mouth daily. Swallow whole.   fluticasone (FLONASE) 50 MCG/ACT nasal spray Place 1 spray into both nostrils daily. (Patient taking differently: Place 1 spray into both nostrils as needed.)   gabapentin (NEURONTIN) 100 MG capsule Take 1 capsule (100 mg total) by mouth 3 (three) times daily as needed.   meloxicam (MOBIC) 15 MG tablet Take 1 tablet (15 mg total) by mouth daily.   methocarbamol (ROBAXIN) 500 MG tablet Take 1 tablet (500 mg total) by mouth 3 (three)  times daily.   ofloxacin (FLOXIN) 0.3 % OTIC solution Place 5 drops into the left ear daily.   Omega-3 Fatty Acids (OMEGA 3 PO) Take by mouth.   SYNTHROID 25 MCG tablet Take 1 tablet (25 mcg total) by mouth daily at 0600   tizanidine (ZANAFLEX) 2 MG capsule Take 1 capsule (2 mg total) by mouth 3 (three) times daily.   [DISCONTINUED] clobetasol (TEMOVATE) 0.05 % external solution Apply topically 2 (two) times daily. (Patient taking differently: Apply topically 2 (two) times daily. As needed)   clobetasol (TEMOVATE) 0.05 % external solution Apply topically 2 (two) times daily.   No facility-administered encounter medications on file as of 12/24/2022.    Review of Systems  Review of Systems  Constitutional: Negative.   HENT:  Positive for ear pain.   Cardiovascular: Negative.   Gastrointestinal: Negative.   Allergic/Immunologic: Negative.   Neurological: Negative.   Psychiatric/Behavioral: Negative.       Objective:   BP 137/77 (BP Location: Left Arm, Patient Position: Sitting, Cuff Size: Normal)   Pulse 67   Ht 5\' 8"  (1.727 m)   Wt 201 lb (91.2 kg)   LMP 09/06/2014 (Approximate)   SpO2 96%   BMI 30.56 kg/m   Wt Readings from Last 5 Encounters:  12/24/22 201 lb (91.2 kg)  11/08/22 199 lb 9.6 oz (90.5 kg)  09/17/22 193 lb 9.6 oz (87.8 kg)  05/24/22 198 lb (89.8 kg)  05/09/22 196 lb (88.9 kg)  Physical Exam Vitals and nursing note reviewed.  Constitutional:      General: She is not in acute distress.    Appearance: She is well-developed.  HENT:     Right Ear: Tympanic membrane normal.     Left Ear: Tympanic membrane normal.  Cardiovascular:     Rate and Rhythm: Normal rate and regular rhythm.  Pulmonary:     Effort: Pulmonary effort is normal.     Breath sounds: Normal breath sounds.  Neurological:     Mental Status: She is alert and oriented to person, place, and time.       Assessment & Plan:   Left ear pain -     Ofloxacin; Place 5 drops into the  left ear daily.  Dispense: 5 mL; Refill: 0  Routine adult health maintenance -     CBC -     Comprehensive metabolic panel  Other orders -     Clobetasol Propionate; Apply topically 2 (two) times daily.  Dispense: 50 mL; Refill: 0     Return in about 3 months (around 03/26/2023).   Ivonne Andrew, NP 12/24/2022

## 2022-12-25 LAB — CBC
Hematocrit: 37.4 % (ref 34.0–46.6)
Hemoglobin: 12.3 g/dL (ref 11.1–15.9)
MCH: 33.1 pg — ABNORMAL HIGH (ref 26.6–33.0)
MCHC: 32.9 g/dL (ref 31.5–35.7)
MCV: 101 fL — ABNORMAL HIGH (ref 79–97)
Platelets: 215 10*3/uL (ref 150–450)
RBC: 3.72 x10E6/uL — ABNORMAL LOW (ref 3.77–5.28)
RDW: 11.9 % (ref 11.7–15.4)
WBC: 5.7 10*3/uL (ref 3.4–10.8)

## 2022-12-25 LAB — COMPREHENSIVE METABOLIC PANEL
ALT: 28 [IU]/L (ref 0–32)
AST: 26 [IU]/L (ref 0–40)
Albumin: 4.6 g/dL (ref 3.8–4.9)
Alkaline Phosphatase: 113 [IU]/L (ref 44–121)
BUN/Creatinine Ratio: 22 (ref 9–23)
BUN: 16 mg/dL (ref 6–24)
Bilirubin Total: 0.4 mg/dL (ref 0.0–1.2)
CO2: 23 mmol/L (ref 20–29)
Calcium: 9.5 mg/dL (ref 8.7–10.2)
Chloride: 107 mmol/L — ABNORMAL HIGH (ref 96–106)
Creatinine, Ser: 0.72 mg/dL (ref 0.57–1.00)
Globulin, Total: 2.2 g/dL (ref 1.5–4.5)
Glucose: 93 mg/dL (ref 70–99)
Potassium: 4.3 mmol/L (ref 3.5–5.2)
Sodium: 142 mmol/L (ref 134–144)
Total Protein: 6.8 g/dL (ref 6.0–8.5)
eGFR: 96 mL/min/{1.73_m2} (ref >59)

## 2023-01-03 ENCOUNTER — Ambulatory Visit: Payer: BC Managed Care – PPO | Admitting: Physical Medicine and Rehabilitation

## 2023-01-07 ENCOUNTER — Ambulatory Visit: Payer: BC Managed Care – PPO | Admitting: Physical Medicine and Rehabilitation

## 2023-01-14 ENCOUNTER — Telehealth: Payer: Self-pay | Admitting: Physical Medicine and Rehabilitation

## 2023-01-14 NOTE — Telephone Encounter (Signed)
Patient called and wants to reschedule her appointment she missed on 01/07/23. CB#[phone]

## 2023-01-17 ENCOUNTER — Ambulatory Visit: Payer: BC Managed Care – PPO | Admitting: Physical Medicine and Rehabilitation

## 2023-01-31 ENCOUNTER — Ambulatory Visit: Payer: BC Managed Care – PPO | Admitting: Physical Medicine and Rehabilitation

## 2023-02-19 ENCOUNTER — Ambulatory Visit: Payer: BC Managed Care – PPO | Admitting: Physical Medicine and Rehabilitation

## 2023-02-19 DIAGNOSIS — N390 Urinary tract infection, site not specified: Secondary | ICD-10-CM | POA: Diagnosis not present

## 2023-03-25 ENCOUNTER — Ambulatory Visit: Payer: Self-pay | Admitting: Nurse Practitioner

## 2023-04-25 ENCOUNTER — Ambulatory Visit: Payer: Self-pay | Admitting: Nurse Practitioner

## 2023-05-03 ENCOUNTER — Encounter: Payer: Self-pay | Admitting: Emergency Medicine

## 2023-05-03 ENCOUNTER — Ambulatory Visit: Admission: EM | Admit: 2023-05-03 | Discharge: 2023-05-03 | Disposition: A | Discharge status: Home / Self Care

## 2023-05-03 DIAGNOSIS — J309 Allergic rhinitis, unspecified: Secondary | ICD-10-CM | POA: Diagnosis not present

## 2023-05-03 MED ORDER — PREDNISONE 20 MG PO TABS
40.0000 mg | ORAL_TABLET | Freq: Every day | ORAL | 0 refills | Status: DC
Start: 2023-05-03 — End: 2023-05-03

## 2023-05-03 MED ORDER — PREDNISONE 20 MG PO TABS: 40.0000 mg | ORAL_TABLET | Freq: Every day | ORAL | 0 refills | Status: AC

## 2023-05-03 NOTE — Discharge Instructions (Signed)
 I suspect that your symptoms are related to allergies.  I have prescribed you prednisone steroid burst to help with this.  Recommend that you take Zyrtec daily as well.

## 2023-05-03 NOTE — ED Triage Notes (Signed)
 Pt presents with congestion, runny nose, sneezing, scratchy throat and coughing that onset around 5 weeks ago. Pt denies emesis, fever, and diarrhea.

## 2023-05-03 NOTE — ED Provider Notes (Signed)
 [name]    CSN: [id number] Arrival date & time: 05/03/23  0805      History   Chief Complaint Chief Complaint  Patient presents with   Facial Pain    HPI Madison Walker is a 58 y.o. female.   Patient presents with approximately 5-week history of nasal congestion, runny nose, sneezing, scratchy throat, cough.  Reports cough is dry and productive at different times.  Denies any known sick contacts or fever.  This is the first time patient has been evaluated since symptoms started.  Reports that she has "sinus problems" throughout the year.  Patient reports that symptoms seem to worsen when she goes to work.  She has taken over-the-counter cold and flu medications with minimal improvement in symptoms.  Denies history of asthma or COPD.  Patient states that she smoked cigarettes for approximately 1 week in the past but does not currently smoke cigarettes.     Past Medical History:  Diagnosis Date   Heart murmur    Hypothyroidism    Varicose veins of bilateral lower extremities with other complications     Patient Active Problem List   Diagnosis Date Noted   Screening for diabetes mellitus (DM) 10/01/2022   Sensory polyneuropathy 08/07/2022   Encounter for screening mammogram for malignant neoplasm of breast 05/24/2022   Small fiber neuropathy 06/14/2021   Routine health maintenance 05/11/2021   Hashimoto's thyroiditis 12/22/2020   Hypothyroidism 12/22/2020   Hypothyroidism due to Hashimoto's thyroiditis 12/21/2020   Palpitations 12/21/2020   Other constipation 11/18/2020   Nausea without vomiting 11/18/2020   Right bundle branch block 09/30/2018   Family history of heart disease 06/19/2018   Atypical chest pain 11/22/2017   Dermoid cyst of ovary (8 cm) 04/07/2015    Past Surgical History:  Procedure Laterality Date   LAPAROSCOPIC UNILATERAL SALPINGO OOPHERECTOMY Left 05/31/2015   Procedure: LAPAROSCOPIC UNILATERAL SALPINGO OOPHORECTOMY;  Surgeon: Tereso Newcomer, MD;  Location: [facility];  Service: Gynecology;  Laterality: Left;   TUBAL LIGATION     UNILATERAL SALPINGECTOMY Right 05/31/2015   Procedure: UNILATERAL SALPINGECTOMY;  Surgeon: Tereso Newcomer, MD;  Location: [facility];  Service: Gynecology;  Laterality: Right;    OB History     Gravida  2   Para  2   Term  2   Preterm      AB      Living  2      SAB      IAB      Ectopic      Multiple      Live Births               Home Medications    Prior to Admission medications   Medication Sig Start Date End Date Taking? Authorizing Provider  Cholecalciferol 1.25 MG (50000 UT) capsule Take 1 capsule every week by oral route for 90 days. 03/18/20  Yes [provider]  estradiol (ESTRACE) 0.1 MG/GM vaginal cream APPLY PEAS SIZE AMOUNT TO AFFECTED AREA AS DIRECTED 12/24/19  Yes [provider]  levothyroxine (SYNTHROID) 25 MCG tablet Take 1 tablet by mouth daily. 04/05/23  Yes [provider]  amoxicillin-clavulanate (AUGMENTIN) 875-125 MG tablet Take 1 tablet by mouth every 12 (twelve) hours.    [provider]  aspirin EC 81 MG tablet Take 81 mg by mouth daily. Swallow whole.    [provider]  clobetasol (TEMOVATE) 0.05 % external solution Apply topically 2 (two) times daily. 12/24/22  Ivonne Andrew, NP  fluticasone (FLONASE) 50 MCG/ACT nasal spray Place 1 spray into both nostrils daily. Patient taking differently: Place 1 spray into both nostrils as needed. 08/16/21   Gustavus Bryant, FNP  gabapentin (NEURONTIN) 100 MG capsule Take 1 capsule (100 mg total) by mouth 3 (three) times daily as needed. 08/06/22   Anson Fret, MD  meloxicam (MOBIC) 15 MG tablet Take 1 tablet (15 mg total) by mouth daily. 12/24/22 12/24/23  Ivonne Andrew, NP  methocarbamol (ROBAXIN) 500 MG tablet Take 1 tablet (500 mg total) by mouth 3 (three) times daily. 11/07/22   Juanda Chance, NP  ofloxacin (FLOXIN) 0.3 % OTIC solution Place 5 drops  into the left ear daily. 12/24/22   Ivonne Andrew, NP  Omega-3 Fatty Acids (OMEGA 3 PO) Take by mouth.    [provider]  predniSONE (DELTASONE) 20 MG tablet Take 2 tablets (40 mg total) by mouth daily for 5 days. 05/03/23 05/08/23  Gustavus Bryant, FNP  SYNTHROID 25 MCG tablet Take 1 tablet (25 mcg total) by mouth daily at 0600 05/23/22     tizanidine (ZANAFLEX) 2 MG capsule Take 1 capsule (2 mg total) by mouth 3 (three) times daily. 09/17/22   Ivonne Andrew, NP    Family History Family History  Problem Relation Age of Onset   Hypertension Mother    COPD Mother    Hernia Mother    Diabetes Father    COPD Father    Hypertension Sister    Hypertension Brother    Coronary artery disease Brother        MI in his 73's   Diabetes Brother    Diabetes Other    Colon cancer Neg Hx    Colon polyps Neg Hx    Esophageal cancer Neg Hx    Rectal cancer Neg Hx    Stomach cancer Neg Hx    Neuropathy Neg Hx     Social History Social History   Tobacco Use   Smoking status: Never    Passive exposure: Never   Smokeless tobacco: Never  Vaping Use   Vaping status: Never Used  Substance Use Topics   Alcohol use: No   Drug use: No     Allergies   Iodine   Review of Systems Review of Systems Per HPI  Physical Exam Triage Vital Signs ED Triage Vitals  Encounter Vitals Group     BP 05/03/23 0821 (!) 148/70     Systolic BP Percentile --      Diastolic BP Percentile --      Pulse Rate 05/03/23 0821 66     Resp 05/03/23 0821 18     Temp 05/03/23 0821 97.7 F (36.5 C)     Temp Source 05/03/23 0821 Oral     SpO2 05/03/23 0821 95 %     Weight --      Height --      Head Circumference --      Peak Flow --      Pain Score 05/03/23 0818 0     Pain Loc --      Pain Education --      Exclude from Growth Chart --    No data found.  Updated Vital Signs BP (!) 148/70 (BP Location: Left Arm)   Pulse 66   Temp 97.7 F (36.5 C) (Oral)   Resp 18   LMP 09/06/2014  (Approximate)   SpO2 95%   Visual Acuity  Right Eye Distance:   Left Eye Distance:   Bilateral Distance:    Right Eye Near:   Left Eye Near:    Bilateral Near:     Physical Exam Constitutional:      General: She is not in acute distress.    Appearance: Normal appearance. She is not toxic-appearing or diaphoretic.  HENT:     Head: Normocephalic and atraumatic.     Right Ear: Tympanic membrane and ear canal normal.     Left Ear: Tympanic membrane and ear canal normal.     Nose: Congestion present.     Mouth/Throat:     Mouth: Mucous membranes are moist.     Pharynx: No posterior oropharyngeal erythema.  Eyes:     Extraocular Movements: Extraocular movements intact.     Conjunctiva/sclera: Conjunctivae normal.     Pupils: Pupils are equal, round, and reactive to light.  Cardiovascular:     Rate and Rhythm: Normal rate and regular rhythm.     Pulses: Normal pulses.     Heart sounds: Normal heart sounds.  Pulmonary:     Effort: Pulmonary effort is normal. No respiratory distress.     Breath sounds: Normal breath sounds. No stridor. No wheezing, rhonchi or rales.  Musculoskeletal:        General: Normal range of motion.     Cervical back: Normal range of motion.  Skin:    General: Skin is warm and dry.  Neurological:     General: No focal deficit present.     Mental Status: She is alert and oriented to person, place, and time. Mental status is at baseline.  Psychiatric:        Mood and Affect: Mood normal.        Behavior: Behavior normal.      UC Treatments / Results  Labs (all labs ordered are listed, but only abnormal results are displayed) Labs Reviewed - No data to display  EKG   Radiology No results found.  Procedures Procedures (including critical care time)  Medications Ordered in UC Medications - No data to display  Initial Impression / Assessment and Plan / UC Course  I have reviewed the triage vital signs and the nursing notes.  Pertinent labs  & imaging results that were available during my care of the patient were reviewed by me and considered in my medical decision making (see chart for details).     Physical exam and symptoms are consistent with allergy related symptoms especially given symptoms since to worsen when she is in her workplace environment.  Advised patient to take daily antihistamine.  She wishes to purchase this over-the-counter as opposed to getting a prescription today.  Will treat with steroid burst given symptoms have been refractory to over-the-counter medications and given duration of symptoms.  There are no adventitious lung sounds on exam or signs of secondary bacterial infection that would warrant antibiotic therapy or chest imaging at this time.  Encouraged patient to follow-up if any symptoms persist or worsen.  Patient verbalized understanding and was agreeable with plan. Final Clinical Impressions(s) / UC Diagnoses   Final diagnoses:  Allergic rhinitis, unspecified seasonality, unspecified trigger     Discharge Instructions      I suspect that your symptoms are related to allergies.  I have prescribed you prednisone steroid burst to help with this.  Recommend that you take Zyrtec daily as well.    ED Prescriptions     Medication Sig Dispense Auth. Provider  predniSONE (DELTASONE) 20 MG tablet  (Status: Discontinued) Take 2 tablets (40 mg total) by mouth daily for 5 days. 10 tablet [place], [place], [place]   predniSONE (DELTASONE) 20 MG tablet Take 2 tablets (40 mg total) by mouth daily for 5 days. 10 tablet Gustavus Bryant, [place]      PDMP not reviewed this encounter.   Gustavus Bryant, [place] 05/03/23 [phone]

## 2023-05-03 NOTE — ED Notes (Signed)
 Pt presents with congestion, runny nose, sneezing, scratchy throat and coughing that onset around 5 weeks ago. Pt denies emesis, fever, and diarrhea.

## 2023-06-10 ENCOUNTER — Ambulatory Visit: Payer: Self-pay | Admitting: Nurse Practitioner

## 2023-06-17 ENCOUNTER — Other Ambulatory Visit: Payer: Self-pay

## 2023-06-17 MED ORDER — CLOBETASOL PROPIONATE 0.05 % EX SOLN: Freq: Two times a day (BID) | CUTANEOUS | 0 refills | Status: DC

## 2023-06-17 NOTE — Telephone Encounter (Signed)
 Please advise [facility]

## 2023-06-24 DIAGNOSIS — N3000 Acute cystitis without hematuria: Secondary | ICD-10-CM | POA: Diagnosis not present

## 2023-06-24 DIAGNOSIS — R350 Frequency of micturition: Secondary | ICD-10-CM | POA: Diagnosis not present

## 2023-06-24 DIAGNOSIS — M545 Low back pain, unspecified: Secondary | ICD-10-CM | POA: Diagnosis not present

## 2023-07-09 ENCOUNTER — Ambulatory Visit: Payer: Self-pay | Admitting: Nurse Practitioner

## 2023-07-18 ENCOUNTER — Ambulatory Visit: Payer: Self-pay | Admitting: Nurse Practitioner

## 2023-08-29 ENCOUNTER — Ambulatory Visit: Payer: Self-pay | Admitting: Nurse Practitioner

## 2023-09-27 ENCOUNTER — Ambulatory Visit: Payer: Self-pay | Admitting: Nurse Practitioner

## 2023-10-04 ENCOUNTER — Ambulatory Visit: Payer: Self-pay | Admitting: Nurse Practitioner

## 2023-10-09 ENCOUNTER — Encounter: Payer: Self-pay | Admitting: Emergency Medicine

## 2023-10-09 ENCOUNTER — Ambulatory Visit (INDEPENDENT_AMBULATORY_CARE_PROVIDER_SITE_OTHER)

## 2023-10-09 ENCOUNTER — Ambulatory Visit
Admission: EM | Admit: 2023-10-09 | Discharge: 2023-10-09 | Disposition: A | Discharge status: Home / Self Care | Attending: Physician Assistant | Admitting: Physician Assistant

## 2023-10-09 DIAGNOSIS — S6990XA Unspecified injury of unspecified wrist, hand and finger(s), initial encounter: Secondary | ICD-10-CM | POA: Insufficient documentation

## 2023-10-09 DIAGNOSIS — N3000 Acute cystitis without hematuria: Secondary | ICD-10-CM | POA: Insufficient documentation

## 2023-10-09 DIAGNOSIS — M79644 Pain in right finger(s): Secondary | ICD-10-CM | POA: Diagnosis not present

## 2023-10-09 DIAGNOSIS — R300 Dysuria: Secondary | ICD-10-CM | POA: Diagnosis not present

## 2023-10-09 LAB — POCT URINE DIPSTICK
Bilirubin, UA: NEGATIVE
Blood, UA: NEGATIVE
Glucose, UA: NEGATIVE mg/dL
Ketones, POC UA: NEGATIVE mg/dL
Nitrite, UA: NEGATIVE
POC PROTEIN,UA: NEGATIVE
Spec Grav, UA: >=1.03 — AB (ref 1.010–1.025)
Urobilinogen, UA: 0.2 U/dL
pH, UA: 5.5 (ref 5.0–8.0)

## 2023-10-09 MED ORDER — CIPROFLOXACIN HCL 500 MG PO TABS: 500.0000 mg | ORAL_TABLET | Freq: Two times a day (BID) | ORAL | 0 refills | Status: AC

## 2023-10-09 NOTE — ED Triage Notes (Signed)
 Pt c/o pressure when trying to urinate with burning.  Also some low back pain and nausea  Onset 3 days ago   Pt also wants her finger checked  st's she injured it approx 1 month ago and continues to have pain in the PIP joint of her right 4th finger

## 2023-10-09 NOTE — ED Provider Notes (Signed)
 [name]    CSN: [id number] Arrival date & time: 10/09/23  0805      History   Chief Complaint No chief complaint on file.   HPI Madison Walker is a 58 y.o. female.   HPI  Past Medical History:  Diagnosis Date   Heart murmur    Hypothyroidism    Varicose veins of bilateral lower extremities with other complications     Patient Active Problem List   Diagnosis Date Noted   Screening for diabetes mellitus (DM) 10/01/2022   Sensory polyneuropathy 08/07/2022   Encounter for screening mammogram for malignant neoplasm of breast 05/24/2022   Small fiber neuropathy 06/14/2021   Routine health maintenance 05/11/2021   Hashimoto's thyroiditis 12/22/2020   Hypothyroidism 12/22/2020   Hypothyroidism due to Hashimoto's thyroiditis 12/21/2020   Palpitations 12/21/2020   Other constipation 11/18/2020   Nausea without vomiting 11/18/2020   Right bundle branch block 09/30/2018   Family history of heart disease 06/19/2018   Atypical chest pain 11/22/2017   Dermoid cyst of ovary (8 cm) 04/07/2015    Past Surgical History:  Procedure Laterality Date   LAPAROSCOPIC UNILATERAL SALPINGO OOPHERECTOMY Left 05/31/2015   Procedure: LAPAROSCOPIC UNILATERAL SALPINGO OOPHORECTOMY;  Surgeon: Gloris DELENA Hugger, MD;  Location: [facility];  Service: Gynecology;  Laterality: Left;   TUBAL LIGATION     UNILATERAL SALPINGECTOMY Right 05/31/2015   Procedure: UNILATERAL SALPINGECTOMY;  Surgeon: Gloris DELENA Hugger, MD;  Location: [facility];  Service: Gynecology;  Laterality: Right;    OB History     Gravida  2   Para  2   Term  2   Preterm      AB      Living  2      SAB      IAB      Ectopic      Multiple      Live Births               Home Medications    Prior to Admission medications   Medication Sig Start Date End Date Taking? Authorizing Provider  amoxicillin -clavulanate (AUGMENTIN ) 875-125 MG tablet Take 1 tablet by mouth every 12 (twelve) hours.    [provider]  aspirin EC 81 MG tablet Take 81 mg by mouth daily. Swallow whole.    [provider]  Cholecalciferol 1.25 MG (50000 UT) capsule Take 1 capsule every week by oral route for 90 days. 03/18/20   [provider]  clobetasol  (TEMOVATE ) 0.05 % external solution Apply topically 2 (two) times daily. 06/17/23   Oley Bascom RAMAN, NP  estradiol (ESTRACE) 0.1 MG/GM vaginal cream APPLY PEAS SIZE AMOUNT TO AFFECTED AREA AS DIRECTED 12/24/19   [provider]  fluticasone  (FLONASE ) 50 MCG/ACT nasal spray Place 1 spray into both nostrils daily. Patient taking differently: Place 1 spray into both nostrils as needed. 08/16/21   Hazen Darryle BRAVO, FNP  gabapentin  (NEURONTIN ) 100 MG capsule Take 1 capsule (100 mg total) by mouth 3 (three) times daily as needed. 08/06/22   Ines Onetha NOVAK, MD  levothyroxine  (SYNTHROID ) 25 MCG tablet Take 1 tablet by mouth daily. 04/05/23   [provider]  meloxicam  (MOBIC ) 15 MG tablet Take 1 tablet (15 mg total) by mouth daily. 12/24/22 12/24/23  Oley Bascom RAMAN, NP  methocarbamol  (ROBAXIN ) 500 MG tablet Take 1 tablet (500 mg total) by mouth 3 (three) times daily. 11/07/22   Williams, Megan E, NP  ofloxacin  (FLOXIN ) 0.3 % OTIC solution Place  5 drops into the left ear daily. 12/24/22   Oley Bascom RAMAN, NP  Omega-3 Fatty Acids (OMEGA 3 PO) Take by mouth.    [provider]  SYNTHROID  25 MCG tablet Take 1 tablet (25 mcg total) by mouth daily at 0600 05/23/22     tizanidine  (ZANAFLEX ) 2 MG capsule Take 1 capsule (2 mg total) by mouth 3 (three) times daily. 09/17/22   Oley Bascom RAMAN, NP    Family History Family History  Problem Relation Age of Onset   Hypertension Mother    COPD Mother    Hernia Mother    Diabetes Father    COPD Father    Hypertension Sister    Hypertension Brother    Coronary artery disease Brother        MI in his 61's   Diabetes Brother    Diabetes Other    Colon cancer Neg Hx    Colon polyps Neg Hx     Esophageal cancer Neg Hx    Rectal cancer Neg Hx    Stomach cancer Neg Hx    Neuropathy Neg Hx     Social History Social History   Tobacco Use   Smoking status: Never    Passive exposure: Never   Smokeless tobacco: Never  Vaping Use   Vaping status: Never Used  Substance Use Topics   Alcohol use: No   Drug use: No     Allergies   Iodine   Review of Systems Review of Systems  Constitutional:  Negative for chills and fever.  Eyes:  Negative for discharge and redness.  Gastrointestinal:  Negative for abdominal pain, nausea and vomiting.  Genitourinary:  Positive for dysuria.     Physical Exam Triage Vital Signs ED Triage Vitals  Encounter Vitals Group     BP      Girls Systolic BP Percentile      Girls Diastolic BP Percentile      Boys Systolic BP Percentile      Boys Diastolic BP Percentile      Pulse      Resp      Temp      Temp src      SpO2      Weight      Height      Head Circumference      Peak Flow      Pain Score      Pain Loc      Pain Education      Exclude from Growth Chart    No data found.  Updated Vital Signs LMP 09/06/2014 (Approximate)   Visual Acuity Right Eye Distance:   Left Eye Distance:   Bilateral Distance:    Right Eye Near:   Left Eye Near:    Bilateral Near:     Physical Exam Vitals and nursing note reviewed.  Constitutional:      General: She is not in acute distress.    Appearance: Normal appearance. She is not ill-appearing.  HENT:     Head: Normocephalic and atraumatic.  Eyes:     Conjunctiva/sclera: Conjunctivae normal.  Cardiovascular:     Rate and Rhythm: Normal rate.  Pulmonary:     Effort: Pulmonary effort is normal.  Neurological:     Mental Status: She is alert.  Psychiatric:        Mood and Affect: Mood normal.        Behavior: Behavior normal.        Thought Content: Thought  content normal.      UC Treatments / Results  Labs (all labs ordered are listed, but only abnormal results are  displayed) Labs Reviewed - No data to display  EKG   Radiology No results found.  Procedures Procedures (including critical care time)  Medications Ordered in UC Medications - No data to display  Initial Impression / Assessment and Plan / UC Course  I have reviewed the triage vital signs and the nursing notes.  Pertinent labs & imaging results that were available during my care of the patient were reviewed by me and considered in my medical decision making (see chart for details).  Clinical Course as of 10/09/23 9075  Wed Oct 09, 2023  0917 DG Finger Ring Right [RM]    Clinical Course User Index [RM] Billy Asberry FALCON, PA-C    *** Final Clinical Impressions(s) / UC Diagnoses   Final diagnoses:  None   Discharge Instructions   None    ED Prescriptions   None    PDMP not reviewed this encounter.

## 2023-10-11 ENCOUNTER — Ambulatory Visit (HOSPITAL_COMMUNITY): Payer: Self-pay

## 2023-10-11 LAB — URINE CULTURE: Culture: 40000 — AB

## 2023-10-11 MED ORDER — NITROFURANTOIN MONOHYD MACRO 100 MG PO CAPS: 100.0000 mg | ORAL_CAPSULE | Freq: Two times a day (BID) | ORAL | 0 refills | Status: AC

## 2023-10-24 ENCOUNTER — Ambulatory Visit: Payer: Self-pay | Admitting: Nurse Practitioner

## 2023-10-27 DIAGNOSIS — N3000 Acute cystitis without hematuria: Secondary | ICD-10-CM | POA: Diagnosis not present

## 2023-10-27 DIAGNOSIS — R300 Dysuria: Secondary | ICD-10-CM | POA: Diagnosis not present

## 2023-10-27 DIAGNOSIS — N390 Urinary tract infection, site not specified: Secondary | ICD-10-CM | POA: Diagnosis not present

## 2023-11-25 ENCOUNTER — Ambulatory Visit: Admitting: Nurse Practitioner

## 2023-11-25 ENCOUNTER — Encounter: Payer: Self-pay | Admitting: Nurse Practitioner

## 2023-11-25 VITALS — BP 121/55 | HR 58 | Ht 68.0 in | Wt 202.0 lb

## 2023-11-25 DIAGNOSIS — Z13220 Encounter for screening for lipoid disorders: Secondary | ICD-10-CM | POA: Diagnosis not present

## 2023-11-25 DIAGNOSIS — H9202 Otalgia, left ear: Secondary | ICD-10-CM

## 2023-11-25 DIAGNOSIS — L259 Unspecified contact dermatitis, unspecified cause: Secondary | ICD-10-CM

## 2023-11-25 DIAGNOSIS — Z0000 Encounter for general adult medical examination without abnormal findings: Secondary | ICD-10-CM | POA: Diagnosis not present

## 2023-11-25 DIAGNOSIS — Z23 Encounter for immunization: Secondary | ICD-10-CM

## 2023-11-25 DIAGNOSIS — S6991XA Unspecified injury of right wrist, hand and finger(s), initial encounter: Secondary | ICD-10-CM | POA: Diagnosis not present

## 2023-11-25 DIAGNOSIS — E039 Hypothyroidism, unspecified: Secondary | ICD-10-CM

## 2023-11-25 MED ORDER — CLOBETASOL PROPIONATE 0.05 % EX SOLN: 1.0000 | Freq: Two times a day (BID) | CUTANEOUS | 0 refills | Status: DC

## 2023-11-25 MED ORDER — OFLOXACIN 0.3 % OT SOLN: 5.0000 [drp] | Freq: Every day | OTIC | 0 refills | Status: AC

## 2023-11-25 NOTE — Progress Notes (Signed)
 Subjective   Patient ID: Madison Walker, female    DOB: 06/29/1965, 58 y.o.   MRN: [id number]  Chief Complaint  Patient presents with   Follow-up    Follow up pt has injury to right hand/finger  , she has pain and swelling  she would like to have an x-ray , pt hasn't used any ice ot heat she was taking otc medication, pt stated that she had this x-ray at [facility] and she was told that it was broken     Referring provider: Oley Bascom RAMAN, NP  Madison Walker is a 58 y.o. female with Past Medical History: No date: Heart murmur No date: Hypothyroidism No date: Varicose veins of bilateral lower extremities with other  complications   HPI  Patient presents today for routine follow-up.  She complains today of right fourth finger hurting.  She states that she hit it over a box.  She states that it is harder to open and close the finger.  We will get an x-ray today.  Concern for trigger finger.  Will place referral to hand specialist. Denies f/c/s, n/v/d, hemoptysis, PND, leg swelling Denies chest pain or edema     Allergies  Allergen Reactions   Iodine Itching, Rash and Other (See Comments)    Caused rash/hives after a surgery    Immunization History  Administered Date(s) Administered   Influenza Whole 01/16/2018   Influenza,inj,Quad PF,6+ Mos 12/13/2016, 12/30/2019, 11/25/2023   Influenza-Unspecified 12/23/2019   PFIZER(Purple Top)SARS-COV-2 Vaccination 09/12/2019, 10/03/2019   Tdap 06/28/2004    Tobacco History: Social History   Tobacco Use  Smoking Status Never   Passive exposure: Never  Smokeless Tobacco Never   Counseling given: Not Answered   Outpatient Encounter Medications as of 11/25/2023  Medication Sig   aspirin EC 81 MG tablet Take 81 mg by mouth daily. Swallow whole.   clobetasol  (TEMOVATE ) 0.05 % external solution Apply 1 Application topically 2 (two) times daily.   levothyroxine  (SYNTHROID ) 25 MCG tablet Take 1 tablet by mouth daily.   [DISCONTINUED]  ofloxacin  (FLOXIN ) 0.3 % OTIC solution Place 5 drops into the left ear daily.   ciprofloxacin  (CIPRO ) 500 MG tablet Take 1 tablet (500 mg total) by mouth every 12 (twelve) hours. (Patient not taking: Reported on 11/25/2023)   nitrofurantoin , macrocrystal-monohydrate, (MACROBID ) 100 MG capsule Take 1 capsule (100 mg total) by mouth 2 (two) times daily. (Patient not taking: Reported on 11/25/2023)   ofloxacin  (FLOXIN ) 0.3 % OTIC solution Place 5 drops into the left ear daily.   SYNTHROID  25 MCG tablet Take 1 tablet (25 mcg total) by mouth daily at 0600 (Patient not taking: Reported on 11/25/2023)   No facility-administered encounter medications on file as of 11/25/2023.    Review of Systems  Review of Systems  Constitutional: Negative.   HENT: Negative.    Cardiovascular: Negative.   Gastrointestinal: Negative.   Allergic/Immunologic: Negative.   Neurological: Negative.   Psychiatric/Behavioral: Negative.       Objective:   BP (!) 121/55   Pulse (!) 58   Ht 5' 8 (1.727 m)   Wt 202 lb (91.6 kg)   LMP 09/06/2014 (Approximate)   SpO2 99%   BMI 30.71 kg/m   Wt Readings from Last 5 Encounters:  11/25/23 202 lb (91.6 kg)  12/24/22 201 lb (91.2 kg)  11/08/22 199 lb 9.6 oz (90.5 kg)  09/17/22 193 lb 9.6 oz (87.8 kg)  05/24/22 198 lb (89.8 kg)     Physical Exam Vitals and nursing  note reviewed.  Constitutional:      General: She is not in acute distress.    Appearance: She is well-developed.  Cardiovascular:     Rate and Rhythm: Normal rate and regular rhythm.  Pulmonary:     Effort: Pulmonary effort is normal.     Breath sounds: Normal breath sounds.  Neurological:     Mental Status: She is alert and oriented to person, place, and time.       Assessment & Plan:   Routine adult health maintenance  Need for influenza vaccination -     Flu Vaccine QUAD 54mo+IM (Fluarix, Fluzone & Alfiuria Quad PF)  Injury of finger of right hand, initial encounter -     DG Hand  Complete Right; Future -     Ambulatory referral to Hand Surgery  Contact dermatitis of scalp -     Clobetasol  Propionate; Apply 1 Application topically 2 (two) times daily.  Dispense: 50 mL; Refill: 0  Left ear pain -     Ofloxacin ; Place 5 drops into the left ear daily.  Dispense: 5 mL; Refill: 0  Hypothyroidism, unspecified type -     Thyroid  Panel With TSH  Lipid screening -     Lipid panel  Routine health maintenance -     CBC -     Comprehensive metabolic panel with GFR     Return in about 3 months (around 02/25/2024).   Bascom GORMAN Borer, NP 11/25/2023

## 2023-11-26 ENCOUNTER — Ambulatory Visit (HOSPITAL_COMMUNITY)
Admission: RE | Admit: 2023-11-26 | Discharge: 2023-11-26 | Disposition: A | Discharge status: Home / Self Care | Source: Ambulatory Visit | Attending: Nurse Practitioner | Admitting: Nurse Practitioner

## 2023-11-26 DIAGNOSIS — Z01419 Encounter for gynecological examination (general) (routine) without abnormal findings: Secondary | ICD-10-CM | POA: Diagnosis not present

## 2023-11-26 DIAGNOSIS — S6991XA Unspecified injury of right wrist, hand and finger(s), initial encounter: Secondary | ICD-10-CM | POA: Insufficient documentation

## 2023-11-26 DIAGNOSIS — N3946 Mixed incontinence: Secondary | ICD-10-CM | POA: Diagnosis not present

## 2023-11-26 DIAGNOSIS — Z1151 Encounter for screening for human papillomavirus (HPV): Secondary | ICD-10-CM | POA: Diagnosis not present

## 2023-11-26 DIAGNOSIS — N814 Uterovaginal prolapse, unspecified: Secondary | ICD-10-CM | POA: Diagnosis not present

## 2023-11-26 DIAGNOSIS — Z124 Encounter for screening for malignant neoplasm of cervix: Secondary | ICD-10-CM | POA: Diagnosis not present

## 2023-11-26 DIAGNOSIS — Z8639 Personal history of other endocrine, nutritional and metabolic disease: Secondary | ICD-10-CM | POA: Diagnosis not present

## 2023-11-26 DIAGNOSIS — Z6830 Body mass index (BMI) 30.0-30.9, adult: Secondary | ICD-10-CM | POA: Diagnosis not present

## 2023-11-26 LAB — COMPREHENSIVE METABOLIC PANEL WITH GFR
ALT: 24 IU/L (ref 0–32)
AST: 24 IU/L (ref 0–40)
Albumin: 4.6 g/dL (ref 3.8–4.9)
Alkaline Phosphatase: 116 IU/L (ref 49–135)
BUN/Creatinine Ratio: 26 (ref 12–28)
BUN: 20 mg/dL (ref 8–27)
Bilirubin Total: 0.3 mg/dL (ref 0.0–1.2)
CO2: 24 mmol/L (ref 20–29)
Calcium: 9.4 mg/dL (ref 8.7–10.3)
Chloride: 105 mmol/L (ref 96–106)
Creatinine, Ser: 0.76 mg/dL (ref 0.57–1.00)
Globulin, Total: 2 g/dL (ref 1.5–4.5)
Glucose: 92 mg/dL (ref 70–99)
Potassium: 4.1 mmol/L (ref 3.5–5.2)
Sodium: 141 mmol/L (ref 134–144)
Total Protein: 6.6 g/dL (ref 6.0–8.5)
eGFR: 90 mL/min/1.73 (ref >59)

## 2023-11-26 LAB — THYROID PANEL WITH TSH
Free Thyroxine Index: 1.5 (ref 1.2–4.9)
T3 Uptake Ratio: 28 % (ref 24–39)
T4, Total: 5.4 ug/dL (ref 4.5–12.0)
TSH: 7.07 u[IU]/mL — ABNORMAL HIGH (ref 0.450–4.500)

## 2023-11-26 LAB — CBC
Hematocrit: 35.2 % (ref 34.0–46.6)
Hemoglobin: 11.5 g/dL (ref 11.1–15.9)
MCH: 32.7 pg (ref 26.6–33.0)
MCHC: 32.7 g/dL (ref 31.5–35.7)
MCV: 100 fL — ABNORMAL HIGH (ref 79–97)
Platelets: 201 x10E3/uL (ref 150–450)
RBC: 3.52 x10E6/uL — ABNORMAL LOW (ref 3.77–5.28)
RDW: 12.2 % (ref 11.7–15.4)
WBC: 7 x10E3/uL (ref 3.4–10.8)

## 2023-11-26 LAB — LIPID PANEL
Chol/HDL Ratio: 3.3 ratio (ref 0.0–4.4)
Cholesterol, Total: 144 mg/dL (ref 100–199)
HDL: 44 mg/dL (ref >39)
LDL Chol Calc (NIH): 88 mg/dL (ref 0–99)
Triglycerides: 57 mg/dL (ref 0–149)
VLDL Cholesterol Cal: 12 mg/dL (ref 5–40)

## 2023-11-28 ENCOUNTER — Ambulatory Visit: Payer: Self-pay | Admitting: Nurse Practitioner

## 2023-11-28 MED ORDER — LEVOTHYROXINE SODIUM 50 MCG PO TABS: 50.0000 ug | ORAL_TABLET | Freq: Every day | ORAL | 11 refills | Status: AC

## 2023-12-13 DIAGNOSIS — Z23 Encounter for immunization: Secondary | ICD-10-CM | POA: Diagnosis not present

## 2023-12-13 DIAGNOSIS — Z0000 Encounter for general adult medical examination without abnormal findings: Secondary | ICD-10-CM | POA: Diagnosis not present

## 2023-12-13 NOTE — Addendum Note (Signed)
 Addended by: VICTORY IHA on: 12/13/2023 04:21 PM   Modules accepted: Orders

## 2023-12-16 ENCOUNTER — Encounter: Payer: Self-pay | Admitting: Radiology

## 2023-12-24 ENCOUNTER — Other Ambulatory Visit: Payer: Self-pay | Admitting: Nurse Practitioner

## 2023-12-24 DIAGNOSIS — L259 Unspecified contact dermatitis, unspecified cause: Secondary | ICD-10-CM

## 2023-12-25 NOTE — Telephone Encounter (Signed)
 Please advise [facility]

## 2024-01-29 ENCOUNTER — Encounter: Admitting: Orthopedic Surgery

## 2024-02-24 ENCOUNTER — Encounter: Admitting: Orthopedic Surgery

## 2024-02-26 ENCOUNTER — Ambulatory Visit: Payer: Self-pay | Admitting: Nurse Practitioner

## 2024-03-11 ENCOUNTER — Encounter: Admitting: Orthopedic Surgery

## 2024-03-31 ENCOUNTER — Encounter: Admitting: Orthopedic Surgery

## 2024-04-09 ENCOUNTER — Ambulatory Visit: Payer: Self-pay | Admitting: Nurse Practitioner
